# Patient Record
Sex: Male | Born: 1935
Health system: Southern US, Community
[De-identification: ages and names within clinical notes are randomized; demographics above are authoritative.]

## PROBLEM LIST (undated history)

## (undated) DIAGNOSIS — D494 Neoplasm of unspecified behavior of bladder: Secondary | ICD-10-CM

## (undated) DIAGNOSIS — R918 Other nonspecific abnormal finding of lung field: Secondary | ICD-10-CM

## (undated) DIAGNOSIS — Z85828 Personal history of other malignant neoplasm of skin: Secondary | ICD-10-CM

## (undated) DIAGNOSIS — F419 Anxiety disorder, unspecified: Secondary | ICD-10-CM

## (undated) DIAGNOSIS — N400 Enlarged prostate without lower urinary tract symptoms: Secondary | ICD-10-CM

## (undated) DIAGNOSIS — N281 Cyst of kidney, acquired: Secondary | ICD-10-CM

## (undated) DIAGNOSIS — Z8546 Personal history of malignant neoplasm of prostate: Secondary | ICD-10-CM

## (undated) DIAGNOSIS — Z973 Presence of spectacles and contact lenses: Secondary | ICD-10-CM

## (undated) DIAGNOSIS — Z8709 Personal history of other diseases of the respiratory system: Secondary | ICD-10-CM

## (undated) DIAGNOSIS — E785 Hyperlipidemia, unspecified: Secondary | ICD-10-CM

## (undated) DIAGNOSIS — J189 Pneumonia, unspecified organism: Secondary | ICD-10-CM

## (undated) DIAGNOSIS — R350 Frequency of micturition: Secondary | ICD-10-CM

## (undated) DIAGNOSIS — C679 Malignant neoplasm of bladder, unspecified: Secondary | ICD-10-CM

## (undated) DIAGNOSIS — Z974 Presence of external hearing-aid: Secondary | ICD-10-CM

## (undated) DIAGNOSIS — J45909 Unspecified asthma, uncomplicated: Secondary | ICD-10-CM

## (undated) DIAGNOSIS — Z923 Personal history of irradiation: Secondary | ICD-10-CM

## (undated) DIAGNOSIS — Z8639 Personal history of other endocrine, nutritional and metabolic disease: Secondary | ICD-10-CM

## (undated) HISTORY — PX: UPPER GI ENDOSCOPY: SHX6162

## (undated) HISTORY — PX: TRANSURETHRAL RESECTION OF BLADDER TUMOR: SHX2575

## (undated) HISTORY — PX: FIBEROPTIC BRONCHOSCOPY: SHX5367

## (undated) HISTORY — PX: COLONOSCOPY: SHX174

## (undated) HISTORY — PX: TONSILLECTOMY: SUR1361

## (undated) HISTORY — PX: OTHER SURGICAL HISTORY: SHX169

---

## 1998-12-31 ENCOUNTER — Encounter: Payer: Self-pay | Admitting: Internal Medicine

## 1998-12-31 ENCOUNTER — Ambulatory Visit (HOSPITAL_COMMUNITY): Admission: RE | Admit: 1998-12-31 | Discharge: 1998-12-31 | Payer: Self-pay | Admitting: Internal Medicine

## 1999-01-11 ENCOUNTER — Ambulatory Visit (HOSPITAL_COMMUNITY): Admission: RE | Admit: 1999-01-11 | Discharge: 1999-01-11 | Payer: Self-pay

## 1999-07-02 ENCOUNTER — Ambulatory Visit (HOSPITAL_BASED_OUTPATIENT_CLINIC_OR_DEPARTMENT_OTHER): Admission: RE | Admit: 1999-07-02 | Discharge: 1999-07-02 | Payer: Self-pay | Admitting: Otolaryngology

## 2001-01-05 ENCOUNTER — Other Ambulatory Visit: Admission: RE | Admit: 2001-01-05 | Discharge: 2001-01-05 | Payer: Self-pay | Admitting: Urology

## 2001-01-12 ENCOUNTER — Encounter: Admission: RE | Admit: 2001-01-12 | Discharge: 2001-04-12 | Payer: Self-pay | Admitting: Radiation Oncology

## 2001-04-05 ENCOUNTER — Ambulatory Visit (HOSPITAL_BASED_OUTPATIENT_CLINIC_OR_DEPARTMENT_OTHER): Admission: RE | Admit: 2001-04-05 | Discharge: 2001-04-05 | Payer: Self-pay | Admitting: Urology

## 2001-04-05 ENCOUNTER — Encounter: Payer: Self-pay | Admitting: Urology

## 2001-05-04 ENCOUNTER — Ambulatory Visit: Admission: RE | Admit: 2001-05-04 | Discharge: 2001-08-02 | Payer: Self-pay | Admitting: Radiation Oncology

## 2001-11-03 HISTORY — PX: PARATHYROIDECTOMY: SHX19

## 2004-01-01 ENCOUNTER — Ambulatory Visit (HOSPITAL_COMMUNITY): Admission: RE | Admit: 2004-01-01 | Discharge: 2004-01-01 | Payer: Self-pay | Admitting: Cardiology

## 2004-01-01 HISTORY — PX: CARDIOVASCULAR STRESS TEST: SHX262

## 2005-08-15 ENCOUNTER — Encounter: Admission: RE | Admit: 2005-08-15 | Discharge: 2005-08-15 | Payer: Self-pay | Admitting: Internal Medicine

## 2005-08-18 ENCOUNTER — Ambulatory Visit: Payer: Self-pay | Admitting: Internal Medicine

## 2005-08-20 ENCOUNTER — Ambulatory Visit: Payer: Self-pay | Admitting: Internal Medicine

## 2005-08-20 ENCOUNTER — Ambulatory Visit: Admission: RE | Admit: 2005-08-20 | Discharge: 2005-08-20 | Payer: Self-pay | Admitting: Internal Medicine

## 2005-08-20 ENCOUNTER — Encounter (INDEPENDENT_AMBULATORY_CARE_PROVIDER_SITE_OTHER): Payer: Self-pay | Admitting: *Deleted

## 2005-08-27 ENCOUNTER — Ambulatory Visit: Payer: Self-pay | Admitting: Internal Medicine

## 2005-09-10 ENCOUNTER — Ambulatory Visit: Payer: Self-pay | Admitting: Internal Medicine

## 2006-01-13 ENCOUNTER — Encounter: Admission: RE | Admit: 2006-01-13 | Discharge: 2006-01-13 | Payer: Self-pay | Admitting: Internal Medicine

## 2006-07-15 ENCOUNTER — Encounter: Admission: RE | Admit: 2006-07-15 | Discharge: 2006-07-15 | Payer: Self-pay | Admitting: Internal Medicine

## 2006-08-28 ENCOUNTER — Ambulatory Visit (HOSPITAL_COMMUNITY): Admission: RE | Admit: 2006-08-28 | Discharge: 2006-08-28 | Payer: Self-pay | Admitting: *Deleted

## 2007-05-31 ENCOUNTER — Encounter: Admission: RE | Admit: 2007-05-31 | Discharge: 2007-05-31 | Payer: Self-pay | Admitting: Internal Medicine

## 2008-11-03 HISTORY — PX: CATARACT EXTRACTION W/ INTRAOCULAR LENS  IMPLANT, BILATERAL: SHX1307

## 2009-07-19 ENCOUNTER — Emergency Department (HOSPITAL_COMMUNITY): Admission: EM | Admit: 2009-07-19 | Discharge: 2009-07-19 | Payer: Self-pay | Admitting: Emergency Medicine

## 2011-02-07 LAB — URINALYSIS, ROUTINE W REFLEX MICROSCOPIC
Glucose, UA: NEGATIVE mg/dL
Nitrite: POSITIVE — AB
Protein, ur: >300 mg/dL — AB
pH: 6 (ref 5.0–8.0)

## 2011-02-07 LAB — URINE CULTURE

## 2011-02-07 LAB — URINE MICROSCOPIC-ADD ON

## 2011-03-21 NOTE — Op Note (Signed)
[place]  Patient:    Jesse, [name]                  MRN: [id number] Proc. Date: 04/05/01 Attending:  Bertram Millard. Retta Diones, M.D. CC:         Janae Bridgeman. Eloise Harman., M.D.  Maryln Gottron, M.D.   Operative Report  PREOPERATIVE DIAGNOSIS:  Adenocarcinoma of the prostate.  POSTOPERATIVE DIAGNOSIS:  Adenocarcinoma of the prostate.  PROCEDURE:  Placement of I-125 seeds within the prostate, cystoscopy.  ANESTHESIA:  General endotracheal anesthesia.  SURGEON:  Bertram Millard. Dahlstedt, M.D.  COMPLICATIONS:  None.  INDICATIONS:  A 77 year old male with adenocarcinoma of the prostate.  This was diagnosed with the patient having a PSA of 4.9.  He had a Gleeson score of 7 out of 10.  Cancer cells were found on the right side only.  The patient has decided to undergo radiation therapy and has completed conformal 3-D radiation therapy by Dr. Dayton Scrape.  He presents at this time for I-125 seed implantation. He is aware of risks and complications of the procedure.  DESCRIPTION OF PROCEDURE:  The patient was administered 400 mg of IV Cipro and taken to the operating room where general endotracheal anesthesia was administered.  He was placed in the dorsal lithotomy position.  A Foley catheter was placed and the patients genitalia and perineum were prepped and draped.  An ultrasound transducer was placed in the rectum and hooked to the transducer holder.  The prostate was scanned.  It was approximately 45 mm in length.  Using a reference plane of 1.5 cm from the base, anchoring needles were placed.  At this point using ultrasonographic and fluoroscopic localization, a total of 77 seeds were placed using 22 needles.  Distribution appeared excellent fluoroscopically.  There was one single seed that was free in the bladder.  There was a mild amount of blood in the patients urine, and I could not grasp the seed, but could identify it cystoscopically. No  other seeds were seen.  It was felt that the patient would void this seed out after the catheter was removed.  A 16 French Foley catheter was replaced and hooked to dependent drainage.  At this point, the procedure was terminated.  The patient was awakened, extubated, and taken to the PACU in stable condition. DD:  04/05/01 TD:  04/05/01 Job: [id number]

## 2011-03-21 NOTE — Op Note (Signed)
NAME:  Jesse [name], Jesse [name]            ACCOUNT NO.:  [id number]   MEDICAL RECORD NO.:  [id number]          PATIENT TYPE:  AMB   LOCATION:  CARD                         FACILITY:  [facility]   PHYSICIAN:  Casimiro Needle B. Sherene Sires, M.D. [facility] OF BIRTH:  04/13/1932   DATE OF PROCEDURE:  08/20/2005  DATE OF DISCHARGE:                                 OPERATIVE REPORT   PROCEDURE:  Fiberoptic bronchoscopy with Wang biopsy of the left lower lobe  orifice.   REFERRED BY:  Janae Bridgeman. Lendell Caprice, M.D.   HISTORY:  Please see attached dictated pulmonary consultation note from this  week's office records. The patient agreed to the procedure after a full  discussion of the risks, benefits, and alternatives in the office.   He was continuously monitored by surface ECG oximetry and maintained  adequate saturations on nasal oxygen throughout the procedure. He was  premedicated with 1% lidocaine by updraft nebulizer and an additional 2%  lidocaine into the right naris.   Using a standard flexible fiberoptic bronchoscope, the right naris was  easily cannulated with good visualization of the entire oropharynx and  larynx. The cords appeared to move normally and there were no apparent upper  airway lesions.   Versed 5 mg IV was then given for adequate sedation. The bronchoscope was  then advanced through the cords with good visualization of all the airways  to the subsegmental level with the following findings:   There were copious mucopurulent secretions present throughout the major  airways, especially in the left lower lobe. Once these were suctioned free,  the underlying mucosa appeared diffusely friable, especially on the left,  but there were no focal endobronchial lesions. There was mild swelling in  the air divider between the left lower lobe basal segment and the superior  segment. This was the only endobronchial abnormality. All of the basal  segments of the left lower lobe and superior segment were  inspected  carefully with no evidence of an endobronchial process.   DESCRIPTION OF PROCEDURE:  Therefore, using a Wang approach, the area of  swelling between the superior segment of the left lower lobe and basal  segment of the left lower lobe was biopsied x1 with scant material. Since  this was a low yield and awkward procedure based on the angle, I decided to  terminate the procedure at that point and will follow the patient as an  outpatient.   IMPRESSION:  Probable pneumonia, possibly aspiration, with organization  mimicking tumor with no evidence of an endobronchial obstructing process.   RECOMMENDATIONS:  1.  Followup in the office with a chest x-ray in a week.  2.  Complete present course of antibiotics (Avelox) per Dr. Pincus Sanes      recommendations.           ______________________________  Charlaine Dalton Sherene Sires, M.D. [facility]     MBW/MEDQ  D:  08/20/2005  T:  08/20/2005  Job:  [id number]   cc:   Janae Bridgeman. Eloise Harman., M.D.  Fax: [phone]

## 2013-05-02 ENCOUNTER — Other Ambulatory Visit: Payer: Self-pay | Admitting: Otolaryngology

## 2014-10-21 ENCOUNTER — Emergency Department (HOSPITAL_COMMUNITY)
Admission: EM | Admit: 2014-10-21 | Discharge: 2014-10-21 | Disposition: A | Discharge status: Home / Self Care | Payer: Medicare Other | Attending: Emergency Medicine | Admitting: Emergency Medicine

## 2014-10-21 ENCOUNTER — Encounter (HOSPITAL_COMMUNITY): Payer: Self-pay | Admitting: *Deleted

## 2014-10-21 DIAGNOSIS — R319 Hematuria, unspecified: Secondary | ICD-10-CM | POA: Diagnosis not present

## 2014-10-21 DIAGNOSIS — Z8659 Personal history of other mental and behavioral disorders: Secondary | ICD-10-CM | POA: Insufficient documentation

## 2014-10-21 DIAGNOSIS — Z87891 Personal history of nicotine dependence: Secondary | ICD-10-CM | POA: Insufficient documentation

## 2014-10-21 DIAGNOSIS — Z8639 Personal history of other endocrine, nutritional and metabolic disease: Secondary | ICD-10-CM | POA: Diagnosis not present

## 2014-10-21 DIAGNOSIS — R339 Retention of urine, unspecified: Secondary | ICD-10-CM

## 2014-10-21 HISTORY — DX: Anxiety disorder, unspecified: F41.9

## 2014-10-21 LAB — URINALYSIS, ROUTINE W REFLEX MICROSCOPIC
BILIRUBIN URINE: NEGATIVE
Glucose, UA: NEGATIVE mg/dL
Ketones, ur: NEGATIVE mg/dL
Leukocytes, UA: NEGATIVE
Nitrite: NEGATIVE
PROTEIN: 30 mg/dL — AB
Specific Gravity, Urine: 1.009 (ref 1.005–1.030)
UROBILINOGEN UA: 0.2 mg/dL (ref 0.0–1.0)
pH: 5.5 (ref 5.0–8.0)

## 2014-10-21 LAB — URINE MICROSCOPIC-ADD ON

## 2014-10-21 NOTE — ED Notes (Signed)
Pt c/o not being able to urinate since 1700 on 10/20/14. Pt states he has also been having some hematuria. Pt states he saw his md on Monday for physical and no problems noted at that time

## 2014-10-21 NOTE — Discharge Instructions (Signed)
Hematuria Hematuria is blood in your urine. It can be caused by a bladder infection, kidney infection, prostate infection, kidney stone, or cancer of your urinary tract. Infections can usually be treated with medicine, and a kidney stone usually will pass through your urine. If neither of these is the cause of your hematuria, further workup to find out the reason may be needed. It is very important that you tell your health care provider about any blood you see in your urine, even if the blood stops without treatment or happens without causing pain. Blood in your urine that happens and then stops and then happens again can be a symptom of a very serious condition. Also, pain is not a symptom in the initial stages of many urinary cancers. HOME CARE INSTRUCTIONS   Drink lots of fluid, 3-4 quarts a day. If you have been diagnosed with an infection, cranberry juice is especially recommended, in addition to large amounts of water.  Avoid caffeine, tea, and carbonated beverages because they tend to irritate the bladder.  Avoid alcohol because it may irritate the prostate.  Take all medicines as directed by your health care provider.  If you were prescribed an antibiotic medicine, finish it all even if you start to feel better.  If you have been diagnosed with a kidney stone, follow your health care provider's instructions regarding straining your urine to catch the stone.  Empty your bladder often. Avoid holding urine for long periods of time.  After a bowel movement, women should cleanse front to back. Use each tissue only once.  Empty your bladder before and after sexual intercourse if you are a male. SEEK MEDICAL CARE IF: 1. You develop back pain. 2. You have a fever. 3. You have a feeling of sickness in your stomach (nausea) or vomiting. 4. Your symptoms are not better in 3 days. Return sooner if you are getting worse. SEEK IMMEDIATE MEDICAL CARE IF:  1. You develop severe vomiting and  are unable to keep the medicine down. 2. You develop severe back or abdominal pain despite taking your medicines. 3. You begin passing a large amount of blood or clots in your urine. 4. You feel extremely weak or faint, or you pass out. MAKE SURE YOU:  1. Understand these instructions. 2. Will watch your condition. 3. Will get help right away if you are not doing well or get worse. Document Released: 10/20/2005 Document Revised: 03/06/2014 Document Reviewed: 06/20/2013 [facility] Patient Information 2015 [place], [place]. This information is not intended to replace advice given to you by your health care provider. Make sure you discuss any questions you have with your health care provider. Foley Catheter Care A Foley catheter is a soft, flexible tube that is placed into the bladder to drain urine. A Foley catheter may be inserted if:  You leak urine or are not able to control when you urinate (urinary incontinence).  You are not able to urinate when you need to (urinary retention).  You had prostate surgery or surgery on the genitals.  You have certain medical conditions, such as multiple sclerosis, dementia, or a spinal cord injury. If you are going home with a Foley catheter in place, follow the instructions below. TAKING CARE OF THE CATHETER 5. Wash your hands with soap and water. 6. Using mild soap and warm water on a clean washcloth:  Clean the area on your body closest to the catheter insertion site using a circular motion, moving away from the catheter. Never wipe toward  the catheter because this could sweep bacteria up into the urethra and cause infection.  Remove all traces of soap. Pat the area dry with a clean towel. For males, reposition the foreskin. 7. Attach the catheter to your leg so there is no tension on the catheter. Use adhesive tape or a leg strap. If you are using adhesive tape, remove any sticky residue left behind by the previous tape you used. 8. Keep the drainage  bag below the level of the bladder, but keep it off the floor. 9. Check throughout the day to be sure the catheter is working and urine is draining freely. Make sure the tubing does not become kinked. 10. Do not pull on the catheter or try to remove it. Pulling could damage internal tissues. TAKING CARE OF THE DRAINAGE BAGS You will be given two drainage bags to take home. One is a large overnight drainage bag, and the other is a smaller leg bag that fits underneath clothing. You may wear the overnight bag at any time, but you should never wear the smaller leg bag at night. Follow the instructions below for how to empty, change, and clean your drainage bags. Emptying the Drainage Bag You must empty your drainage bag when it is  - full or at least 2-3 times a day. 5. Wash your hands with soap and water. 6. Keep the drainage bag below your hips, below the level of your bladder. This stops urine from going back into the tubing and into your bladder. 7. Hold the dirty bag over the toilet or a clean container. 8. Open the pour spout at the bottom of the bag and empty the urine into the toilet or container. Do not let the pour spout touch the toilet, container, or any other surface. Doing so can place bacteria on the bag, which can cause an infection. 9. Clean the pour spout with a gauze pad or cotton ball that has rubbing alcohol on it. 10. Close the pour spout. 11. Attach the bag to your leg with adhesive tape or a leg strap. 12. Wash your hands well. Changing the Drainage Bag Change your drainage bag once a month or sooner if it starts to smell bad or look dirty. Below are steps to follow when changing the drainage bag. 4. Wash your hands with soap and water. 5. Pinch off the rubber catheter so that urine does not spill out. 6. Disconnect the catheter tube from the drainage tube at the connection valve. Do not let the tubes touch any surface. 7. Clean the end of the catheter tube with an alcohol  wipe. Use a different alcohol wipe to clean the end of the drainage tube. 8. Connect the catheter tube to the drainage tube of the clean drainage bag. 9. Attach the new bag to the leg with adhesive tape or a leg strap. Avoid attaching the new bag too tightly. 10. Wash your hands well. Cleaning the Drainage Bag 1. Wash your hands with soap and water. 2. Wash the bag in warm, soapy water. 3. Rinse the bag thoroughly with warm water. 4. Fill the bag with a solution of white vinegar and water (1 cup vinegar to 1 qt warm water [.2 L vinegar to 1 L warm water]). Close the bag and soak it for 30 minutes in the solution. 5. Rinse the bag with warm water. 6. Hang the bag to dry with the pour spout open and hanging downward. 7. Store the clean bag (once it is dry)  in a clean plastic bag. 8. Wash your hands well. PREVENTING INFECTION  Wash your hands before and after handling your catheter.  Take showers daily and wash the area where the catheter enters your body. Do not take baths. Replace wet leg straps with dry ones, if this applies.  Do not use powders, sprays, or lotions on the genital area. Only use creams, lotions, or ointments as directed by your caregiver.  For females, wipe from front to back after each bowel movement.  Drink enough fluids to keep your urine clear or pale yellow unless you have a fluid restriction.  Do not let the drainage bag or tubing touch or lie on the floor.  Wear cotton underwear to absorb moisture and to keep your skin drier. SEEK MEDICAL CARE IF:   Your urine is cloudy or smells unusually bad.  Your catheter becomes clogged.  You are not draining urine into the bag or your bladder feels full.  Your catheter starts to leak. SEEK IMMEDIATE MEDICAL CARE IF:   You have pain, swelling, redness, or pus where the catheter enters the body.  You have pain in the abdomen, legs, lower back, or bladder.  You have a fever.  You see blood fill the catheter, or  your urine is pink or red.  You have nausea, vomiting, or chills.  Your catheter gets pulled out. MAKE SURE YOU:   Understand these instructions.  Will watch your condition.  Will get help right away if you are not doing well or get worse. Document Released: 10/20/2005 Document Revised: 03/06/2014 Document Reviewed: 10/11/2012 [facility] Patient Information 2015 [place], [place]. This information is not intended to replace advice given to you by your health care provider. Make sure you discuss any questions you have with your health care provider. Acute Urinary Retention Acute urinary retention is the temporary inability to urinate. This is a common problem in older men. As men age their prostates become larger and block the flow of urine from the bladder. This is usually a problem that has come on gradually.  HOME CARE INSTRUCTIONS If you are sent home with a Foley catheter and a drainage system, you will need to discuss the best course of action with your health care provider. While the catheter is in, maintain a good intake of fluids. Keep the drainage bag emptied and lower than your catheter. This is so that contaminated urine will not flow back into your bladder, which could lead to a urinary tract infection. There are two main types of drainage bags. One is a large bag that usually is used at night. It has a good capacity that will allow you to sleep through the night without having to empty it. The second type is called a leg bag. It has a smaller capacity, so it needs to be emptied more frequently. However, the main advantage is that it can be attached by a leg strap and can go underneath your clothing, allowing you the freedom to move about or leave your home. Only take over-the-counter or prescription medicines for pain, discomfort, or fever as directed by your health care provider.  SEEK MEDICAL CARE IF:  You develop a low-grade fever.  You experience spasms or leakage of urine with  the spasms. SEEK IMMEDIATE MEDICAL CARE IF:  11. You develop chills or fever. 12. Your catheter stops draining urine. 13. Your catheter falls out. 14. You start to develop increased bleeding that does not respond to rest and increased fluid intake. MAKE SURE  YOU: 13. Understand these instructions. 14. Will watch your condition. 15. Will get help right away if you are not doing well or get worse. Document Released: 01/26/2001 Document Revised: 10/25/2013 Document Reviewed: 03/31/2013 [facility] Patient Information 2015 [place], [place]. This information is not intended to replace advice given to you by your health care provider. Make sure you discuss any questions you have with your health care provider.

## 2014-10-21 NOTE — ED Provider Notes (Signed)
CSN: [id number]     Arrival date & time 10/21/14  0115 History   None    Chief Complaint  Patient presents with  . Urinary Retention  . Hematuria     (Consider location/radiation/quality/duration/timing/severity/associated sxs/prior Treatment) Patient is a 80 y.o. male presenting with hematuria. The history is provided by the patient. No language interpreter was used.  Hematuria Associated symptoms include abdominal pain. Pertinent negatives include no chills, fever or vomiting. Associated symptoms comments: For the past 3 months he has seen blood at the end of a urinary stream. No difficulty urinating or dysuria. Last evening he woke with the need to go to the bathroom but could not produce a urinary stream. He reports history of retention, the last episode was 2 years ago. No fever, testicular pain or scrotal swelling..    Past Medical History  Diagnosis Date  . Hyperlipemia   . Anxiety    Past Surgical History  Procedure Laterality Date  . Parathyroidectomy    . Prostatectomy    . Cataract extraction    . Nose surgery     History reviewed. No pertinent family history. History  Substance Use Topics  . Smoking status: Former Smoker    Quit date: 11/03/1984  . Smokeless tobacco: Never Used  . Alcohol Use: Yes     Comment: with dinner    Review of Systems  Constitutional: Negative for fever and chills.  Gastrointestinal: Positive for abdominal pain. Negative for vomiting.  Genitourinary: Positive for hematuria and difficulty urinating. Negative for dysuria, scrotal swelling and testicular pain.  Musculoskeletal: Negative.   Skin: Negative.   Neurological: Negative.       Allergies  Review of patient's allergies indicates no known allergies.  Home Medications   Prior to Admission medications   Not on File   BP 124/58 mmHg  Pulse 88  Temp(Src) 97.5 F (36.4 C) (Oral)  Resp 18  SpO2 100% Physical Exam  Constitutional: He is oriented to person, place, and  time. He appears well-developed and well-nourished.  HENT:  Head: Normocephalic.  Neck: Normal range of motion. Neck supple.  Cardiovascular: Normal rate and regular rhythm.   Pulmonary/Chest: Effort normal and breath sounds normal.  Abdominal: Soft. Bowel sounds are normal. There is no tenderness. There is no rebound and no guarding.  Examined after placement of foley catheter.   Musculoskeletal: Normal range of motion.  Neurological: He is alert and oriented to person, place, and time.  Skin: Skin is warm and dry. No rash noted.  Psychiatric: He has a normal mood and affect.    ED Course  Procedures (including critical care time) Labs Review Labs Reviewed  URINALYSIS, ROUTINE W REFLEX MICROSCOPIC - Abnormal; Notable for the following:    APPearance CLOUDY (*)    Hgb urine dipstick LARGE (*)    Protein, ur 30 (*)    All other components within normal limits  URINE MICROSCOPIC-ADD ON - Abnormal; Notable for the following:    Bacteria, UA FEW (*)    All other components within normal limits    Imaging Review No results found.   EKG Interpretation None      MDM   Final diagnoses:  None    1. Urinary retention 2. Hematuria  Foley catheter placed. Approximately 800 cc's bloody urine without clots returned. He feels much better. No infection in urine. He can be discharged home with leg bag, foley in place, and follow up with his urologist (Dr. Diona Fanti) on Monday.  Dewaine Oats, PA-C 10/21/14 [place], MD 10/22/14 [phone]

## 2014-10-21 NOTE — ED Notes (Signed)
Discharge instructions reviewed with patient--agrees and verbalized understanding Patient informed of need to make and keep follow up appointment with Urology--agrees and verbalized understanding VS updated and stable--reviewed with patient at time of DC--agrees and verbalized understanding Patient alert and oriented x 4 and in NAD at time of discharge All questions related to this ED visit, DC instructions and follow up care answered to patient's satisfaction by this nurse Leg bag placed prior to DC from ED Patient and pt's wife educated about care of catheter and how to empty urine from bag--both v/u

## 2017-02-13 ENCOUNTER — Encounter (HOSPITAL_BASED_OUTPATIENT_CLINIC_OR_DEPARTMENT_OTHER): Payer: Self-pay | Admitting: Emergency Medicine

## 2017-02-13 ENCOUNTER — Emergency Department (HOSPITAL_BASED_OUTPATIENT_CLINIC_OR_DEPARTMENT_OTHER)
Admission: EM | Admit: 2017-02-13 | Discharge: 2017-02-13 | Disposition: A | Discharge status: Home / Self Care | Payer: Medicare Other | Attending: Emergency Medicine | Admitting: Emergency Medicine

## 2017-02-13 DIAGNOSIS — Z87891 Personal history of nicotine dependence: Secondary | ICD-10-CM | POA: Insufficient documentation

## 2017-02-13 DIAGNOSIS — R319 Hematuria, unspecified: Secondary | ICD-10-CM | POA: Insufficient documentation

## 2017-02-13 DIAGNOSIS — R1030 Lower abdominal pain, unspecified: Secondary | ICD-10-CM | POA: Diagnosis not present

## 2017-02-13 DIAGNOSIS — Z79899 Other long term (current) drug therapy: Secondary | ICD-10-CM | POA: Insufficient documentation

## 2017-02-13 DIAGNOSIS — R339 Retention of urine, unspecified: Secondary | ICD-10-CM | POA: Diagnosis not present

## 2017-02-13 HISTORY — DX: Malignant neoplasm of bladder, unspecified: C67.9

## 2017-02-13 LAB — URINALYSIS, ROUTINE W REFLEX MICROSCOPIC

## 2017-02-13 LAB — URINALYSIS, MICROSCOPIC (REFLEX)

## 2017-02-13 NOTE — ED Triage Notes (Signed)
Pt seen on 02/10/17 for Bladder biopsy.  Pt started to have some hematuria and difficulty urination today.  Pt has supplies for straight catheterization but is not aware how to use them.  The nurse at the independent facility where pt lives instructed the patient come to ED.

## 2017-02-13 NOTE — ED Notes (Signed)
Pt discharged to home with family. NAD.  

## 2017-02-13 NOTE — ED Provider Notes (Signed)
[facility] Provider Note   CSN: [id number] Arrival date & time: 02/13/17  1704     History   Chief Complaint Chief Complaint  Patient presents with  . Urinary Retention    HPI Jesse [name] is a 83 y.o. male.  Patient is a 83 year old male who presents with urinary retention. [place] He [place] seen a urologist in [place]. He saw his urologist in [place] this past Tuesday and had a bladder biopsy which she reports was negative for cancer. However he's had difficulty urinating since that time. On Wednesday while he was still in [place] and he went back to the urologist office in the place a Foley catheter with a leg bag. He states that Wednesday night he wasn't getting the drainage from the catheter and he cut the balloon and pulled the catheter out. His urologist had originally told him to leave it in until Friday which is today but since it wasn't working and he went ahead and pulled it out on Wednesday. Yesterday he doesn't have any trouble with urination although he has had some intermittent hematuria. Today's had more difficulty urinating with his last urination was normal being early this morning at about 5:30. He still having some bleeding from the tip of his penis and he has pain at the tip of his penis. He has some pressure feeling to his bladder. No fevers. No nausea or vomiting. He is currently on Cipro and has 1 more days worth of antibiotics to take.      Past Medical History:  Diagnosis Date  . Anxiety   . Bladder cancer ([place])   . Enlarged prostate   . Hyperlipemia     There are no active problems to display for this patient.   Past Surgical History:  Procedure Laterality Date  . CATARACT EXTRACTION    . CYSTOSTOMY W/ BLADDER BIOPSY    . NOSE SURGERY    . PARATHYROIDECTOMY    . PROSTATECTOMY         Home Medications    Prior to Admission medications   Medication Sig Start Date End Date  Taking? Authorizing Provider  chlordiazePOXIDE (LIBRIUM) 10 MG capsule Take 10 mg by mouth daily.   Yes Historical Provider, MD  Cholecalciferol (VITAMIN D3) 1000 units CAPS Take 1,000 Units by mouth daily.   Yes Historical Provider, MD  ciprofloxacin (CIPRO) 250 MG tablet Take 250 mg by mouth 2 (two) times daily.   Yes Historical Provider, MD  nystatin-triamcinolone (MYCOLOG II) cream Apply 1 application topically 4 (four) times daily.   Yes Historical Provider, MD  oxyCODONE-acetaminophen (PERCOCET/ROXICET) 5-325 MG tablet Take 1-2 tablets by mouth every 4 (four) hours as needed for severe pain.   Yes Historical Provider, MD  pravastatin (PRAVACHOL) 40 MG tablet Take 40 mg by mouth daily.  09/30/14  Yes Historical Provider, MD  solifenacin (VESICARE) 10 MG tablet Take 10 mg by mouth daily.   Yes Historical Provider, MD  tamsulosin (FLOMAX) 0.4 MG CAPS capsule Take 0.8 mg by mouth daily.   Yes Historical Provider, MD  thiamine 100 MG tablet Take 100 mg by mouth daily.   Yes Historical Provider, MD    Family History No family history on file.  Social History Social History  Substance Use Topics  . Smoking status: Former Smoker    Quit date: 11/03/1984  . Smokeless tobacco: Never Used  . Alcohol use Yes     Comment: with dinner  Allergies   Patient has no known allergies.   Review of Systems Review of Systems  Constitutional: Negative for chills, diaphoresis, fatigue and fever.  HENT: Negative for congestion, rhinorrhea and sneezing.   Eyes: Negative.   Respiratory: Negative for cough, chest tightness and shortness of breath.   Cardiovascular: Negative for chest pain and leg swelling.  Gastrointestinal: Positive for abdominal pain. Negative for blood in stool, diarrhea, nausea and vomiting.  Genitourinary: Positive for difficulty urinating, hematuria and penile pain. Negative for flank pain and frequency.  Musculoskeletal: Negative for arthralgias and back pain.  Skin:  Negative for rash.  Neurological: Negative for dizziness, speech difficulty, weakness, numbness and headaches.     Physical Exam Updated Vital Signs BP 128/83 (BP Location: Right Arm)   Pulse 77   Temp 98.6 F (37 C) (Oral)   Resp 18   Ht 5\' 9"  (1.753 m)   Wt 138 lb (62.6 kg)   SpO2 96%   BMI 20.38 kg/m   Physical Exam  Constitutional: He is oriented to person, place, and time. He appears well-developed and well-nourished.  HENT:  Head: Normocephalic and atraumatic.  Eyes: Pupils are equal, round, and reactive to light.  Neck: Normal range of motion. Neck supple.  Cardiovascular: Normal rate, regular rhythm and normal heart sounds.   Pulmonary/Chest: Effort normal and breath sounds normal. No respiratory distress. He has no wheezes. He has no rales. He exhibits no tenderness.  Abdominal: Soft. Bowel sounds are normal. There is tenderness (Moderate suprapubic tenderness). There is no rebound and no guarding.  Genitourinary:  Genitourinary Comments: Small amount of blood at the tip of the penis  Musculoskeletal: Normal range of motion. He exhibits no edema.  Lymphadenopathy:    He has no cervical adenopathy.  Neurological: He is alert and oriented to person, place, and time.  Skin: Skin is warm and dry. No rash noted.  Psychiatric: He has a normal mood and affect.     ED Treatments / Results  Labs (all labs ordered are listed, but only abnormal results are displayed) Labs Reviewed  URINALYSIS, ROUTINE W REFLEX MICROSCOPIC - Abnormal; Notable for the following:       Result Value   Color, Urine BROWN (*)    APPearance TURBID (*)    Glucose, UA   (*)    Value: TEST NOT REPORTED DUE TO COLOR INTERFERENCE OF URINE PIGMENT   Hgb urine dipstick   (*)    Value: TEST NOT REPORTED DUE TO COLOR INTERFERENCE OF URINE PIGMENT   Bilirubin Urine   (*)    Value: TEST NOT REPORTED DUE TO COLOR INTERFERENCE OF URINE PIGMENT   Ketones, ur   (*)    Value: TEST NOT REPORTED DUE TO  COLOR INTERFERENCE OF URINE PIGMENT   Protein, ur   (*)    Value: TEST NOT REPORTED DUE TO COLOR INTERFERENCE OF URINE PIGMENT   Nitrite   (*)    Value: TEST NOT REPORTED DUE TO COLOR INTERFERENCE OF URINE PIGMENT   Leukocytes, UA   (*)    Value: TEST NOT REPORTED DUE TO COLOR INTERFERENCE OF URINE PIGMENT   All other components within normal limits  URINALYSIS, MICROSCOPIC (REFLEX) - Abnormal; Notable for the following:    Bacteria, UA FEW (*)    Squamous Epithelial / LPF 0-5 (*)    All other components within normal limits    EKG  EKG Interpretation None       Radiology No results found.  Procedures  Procedures (including critical care time)  Medications Ordered in ED Medications - No data to display   Initial Impression / Assessment and Plan / ED Course  I have reviewed the triage vital signs and the nursing notes.  Pertinent labs & imaging results that were available during my care of the patient were reviewed by me and considered in my medical decision making (see chart for details).     Patient had a Foley catheter placed and there was over 700 cc of bloody urine return. There some intermittent clotting but it is draining well. The urinalysis did not report out due to the blood. He is currently on antibiotics I did not feel the need to change this. He will call his urologist in [place] on Monday to ascertain when he needs to take the catheter out. He is well quit to remove the catheter on his own. He was advised return here if he has any worsening symptoms. He's also been a call [facility] to establish care there as he's been seen there in the past.  Final Clinical Impressions(s) / ED Diagnoses   Final diagnoses:  Urinary retention    New Prescriptions New Prescriptions   No medications on file     Malvin Johns, MD 02/13/17 1937

## 2017-02-13 NOTE — ED Triage Notes (Signed)
Pt presents to ED via EMS with complaints of urinary retention.  PT states he has not urinated since 530am. Pt had bladder biopsy 2 days ago.

## 2017-02-14 ENCOUNTER — Encounter (HOSPITAL_BASED_OUTPATIENT_CLINIC_OR_DEPARTMENT_OTHER): Payer: Self-pay | Admitting: *Deleted

## 2017-02-14 ENCOUNTER — Emergency Department (HOSPITAL_BASED_OUTPATIENT_CLINIC_OR_DEPARTMENT_OTHER)
Admission: EM | Admit: 2017-02-14 | Discharge: 2017-02-14 | Disposition: A | Discharge status: Home / Self Care | Payer: Medicare Other | Attending: Emergency Medicine | Admitting: Emergency Medicine

## 2017-02-14 DIAGNOSIS — Z87891 Personal history of nicotine dependence: Secondary | ICD-10-CM | POA: Diagnosis not present

## 2017-02-14 DIAGNOSIS — Z79899 Other long term (current) drug therapy: Secondary | ICD-10-CM | POA: Diagnosis not present

## 2017-02-14 DIAGNOSIS — R339 Retention of urine, unspecified: Secondary | ICD-10-CM | POA: Insufficient documentation

## 2017-02-14 DIAGNOSIS — R319 Hematuria, unspecified: Secondary | ICD-10-CM | POA: Insufficient documentation

## 2017-02-14 DIAGNOSIS — Z8551 Personal history of malignant neoplasm of bladder: Secondary | ICD-10-CM | POA: Diagnosis not present

## 2017-02-14 NOTE — ED Provider Notes (Addendum)
[facility] Provider Note   CSN: [id number] Arrival date & time: 02/14/17  1032     History   Chief Complaint Chief Complaint  Patient presents with  . Urinary Retention    HPI Jesse [name] is a 83 y.o. male.  Patient status post bladder biopsy 6 on Monday in the [place] area. Patient has now moved back to this area. Patient followed by [facility] in the past. She had a history of bladder cancer in the past. Screening and follow-up raise concerns reason for the biopsies. Patient is not on blood thinners. Patient seen on the 13th for urinary retention. Patient's had supplies for straight cathing but did not know how to use them when he was seen on the 13th. On the 13th catheter was placed and leg bag put in place patient's been draining well since then. This morning he noticed decreased urine output. No real feeling of distention or abdominal pain. No nausea no vomiting. Chart reviewed from ED visit on the 13th.      Past Medical History:  Diagnosis Date  . Anxiety   . Bladder cancer ([place])   . Enlarged prostate   . Hyperlipemia     There are no active problems to display for this patient.   Past Surgical History:  Procedure Laterality Date  . CATARACT EXTRACTION    . CYSTOSTOMY W/ BLADDER BIOPSY    . NOSE SURGERY    . PARATHYROIDECTOMY    . PROSTATECTOMY         Home Medications    Prior to Admission medications   Medication Sig Start Date End Date Taking? Authorizing Provider  chlordiazePOXIDE (LIBRIUM) 10 MG capsule Take 10 mg by mouth daily.    Historical Provider, MD  Cholecalciferol (VITAMIN D3) 1000 units CAPS Take 1,000 Units by mouth daily.    Historical Provider, MD  ciprofloxacin (CIPRO) 250 MG tablet Take 250 mg by mouth 2 (two) times daily.    Historical Provider, MD  nystatin-triamcinolone (MYCOLOG II) cream Apply 1 application topically 4 (four) times daily.    Historical Provider, MD  oxyCODONE-acetaminophen  (PERCOCET/ROXICET) 5-325 MG tablet Take 1-2 tablets by mouth every 4 (four) hours as needed for severe pain.    Historical Provider, MD  pravastatin (PRAVACHOL) 40 MG tablet Take 40 mg by mouth daily.  09/30/14   Historical Provider, MD  solifenacin (VESICARE) 10 MG tablet Take 10 mg by mouth daily.    Historical Provider, MD  tamsulosin (FLOMAX) 0.4 MG CAPS capsule Take 0.8 mg by mouth daily.    Historical Provider, MD  thiamine 100 MG tablet Take 100 mg by mouth daily.    Historical Provider, MD    Family History History reviewed. No pertinent family history.  Social History Social History  Substance Use Topics  . Smoking status: Former Smoker    Quit date: 11/03/1984  . Smokeless tobacco: Never Used  . Alcohol use Yes     Comment: with dinner     Allergies   Patient has no known allergies.   Review of Systems Review of Systems  Constitutional: Negative for fever.  Eyes: Negative for redness.  Respiratory: Negative for shortness of breath.   Cardiovascular: Negative for chest pain.  Gastrointestinal: Negative for abdominal pain.  Genitourinary: Positive for difficulty urinating and hematuria.  Musculoskeletal: Negative for back pain.  Skin: Negative for rash.  Hematological: Does not bruise/bleed easily.  Psychiatric/Behavioral: Negative for confusion.     Physical Exam Updated Vital Signs  BP (!) 150/81 (BP Location: Right Arm)   Pulse (!) 104   Temp 98.2 F (36.8 C) (Oral)   Resp 16   Ht 5\' 9"  (1.753 m)   Wt 62.6 kg   SpO2 96%   BMI 20.38 kg/m   Physical Exam  Constitutional: He appears well-developed and well-nourished. No distress.  HENT:  Head: Normocephalic and atraumatic.  Mouth/Throat: Oropharynx is clear and moist.  Cardiovascular: Normal rate and regular rhythm.   Pulmonary/Chest: Effort normal and breath sounds normal.  Abdominal: Soft. Bowel sounds are normal. He exhibits no distension. There is no tenderness.  Genitourinary:  Genitourinary  Comments: Foley catheter in place. Leg bag attached. Minimal urine in the leg bag. Low bit of the blood noted.  Musculoskeletal: Normal range of motion.  Nursing note and vitals reviewed.    ED Treatments / Results  Labs (all labs ordered are listed, but only abnormal results are displayed) Labs Reviewed - No data to display  EKG  EKG Interpretation None       Radiology No results found.  Procedures Procedures (including critical care time)  Medications Ordered in ED Medications - No data to display   Initial Impression / Assessment and Plan / ED Course  I have reviewed the triage vital signs and the nursing notes.  Pertinent labs & imaging results that were available during my care of the patient were reviewed by me and considered in my medical decision making (see chart for details).     Patient just seen on the 13th for urinary retention. Patient has Foley catheter in place a leg bag. At that time they replaced everything. Patient awoke this morning with decreased urinary drainage. Bladder scan only shows 150 mL of urine so not overly distended today. We did irrigated and a clot did flush through. Patient had 6 bladder biopsies on Monday. Now is moved to this area. Followed by [facility] in the past. He will contact them for follow-up. Clots off again he will return.  No reason to do any labs are to check the urine.  Final Clinical Impressions(s) / ED Diagnoses   Final diagnoses:  Urinary retention    New Prescriptions New Prescriptions   No medications on file     Fredia Sorrow, MD 02/14/17 [place], MD 02/14/17 1245

## 2017-02-14 NOTE — ED Triage Notes (Signed)
Pt with urinary retention, states that he has not had any drainage into his leg bag since 7am this morning.  Hx of same.

## 2017-02-14 NOTE — Discharge Instructions (Signed)
Drink plenty of fluids. Call urology for follow-up. Return if the Foley catheter and leg bag clogs up again.

## 2017-02-14 NOTE — ED Notes (Signed)
Foley cath intact with blood tinged urine in tubing.

## 2017-06-17 ENCOUNTER — Other Ambulatory Visit: Payer: Self-pay | Admitting: Urology

## 2017-07-21 ENCOUNTER — Encounter (HOSPITAL_BASED_OUTPATIENT_CLINIC_OR_DEPARTMENT_OTHER): Payer: Self-pay | Admitting: *Deleted

## 2017-07-21 NOTE — Progress Notes (Signed)
NPO AFTER MN W/ EXCEPTION CLEAR LIQUIDS UNTIL 0630 (NO CREAM [employer]).  ARRIVE AT 1100.  NEEDS HG.  WILL TAKE FLOMAX ,  PRAVASTATIN, VESICARE, AND LIBRIUM AM DOS W/ SIPS OF WATER.

## 2017-07-27 ENCOUNTER — Ambulatory Visit (HOSPITAL_BASED_OUTPATIENT_CLINIC_OR_DEPARTMENT_OTHER): Payer: Medicare Other | Admitting: Certified Registered"

## 2017-07-27 ENCOUNTER — Encounter (HOSPITAL_BASED_OUTPATIENT_CLINIC_OR_DEPARTMENT_OTHER)
Admission: RE | Disposition: A | Discharge status: Home / Self Care | Payer: Self-pay | Source: Ambulatory Visit | Attending: Urology

## 2017-07-27 ENCOUNTER — Encounter (HOSPITAL_BASED_OUTPATIENT_CLINIC_OR_DEPARTMENT_OTHER): Payer: Self-pay | Admitting: *Deleted

## 2017-07-27 ENCOUNTER — Ambulatory Visit (HOSPITAL_BASED_OUTPATIENT_CLINIC_OR_DEPARTMENT_OTHER)
Admission: RE | Admit: 2017-07-27 | Discharge: 2017-07-27 | Disposition: A | Discharge status: Home / Self Care | Payer: Medicare Other | Source: Ambulatory Visit | Attending: Urology | Admitting: Urology

## 2017-07-27 DIAGNOSIS — Z79899 Other long term (current) drug therapy: Secondary | ICD-10-CM | POA: Diagnosis not present

## 2017-07-27 DIAGNOSIS — R828 Abnormal findings on cytological and histological examination of urine: Secondary | ICD-10-CM | POA: Insufficient documentation

## 2017-07-27 DIAGNOSIS — C679 Malignant neoplasm of bladder, unspecified: Secondary | ICD-10-CM | POA: Insufficient documentation

## 2017-07-27 DIAGNOSIS — Z87891 Personal history of nicotine dependence: Secondary | ICD-10-CM | POA: Insufficient documentation

## 2017-07-27 DIAGNOSIS — E785 Hyperlipidemia, unspecified: Secondary | ICD-10-CM | POA: Diagnosis not present

## 2017-07-27 DIAGNOSIS — M1990 Unspecified osteoarthritis, unspecified site: Secondary | ICD-10-CM | POA: Insufficient documentation

## 2017-07-27 DIAGNOSIS — E890 Postprocedural hypothyroidism: Secondary | ICD-10-CM | POA: Diagnosis not present

## 2017-07-27 DIAGNOSIS — F419 Anxiety disorder, unspecified: Secondary | ICD-10-CM | POA: Insufficient documentation

## 2017-07-27 DIAGNOSIS — Z8546 Personal history of malignant neoplasm of prostate: Secondary | ICD-10-CM | POA: Insufficient documentation

## 2017-07-27 HISTORY — DX: Presence of spectacles and contact lenses: Z97.3

## 2017-07-27 HISTORY — DX: Other nonspecific abnormal finding of lung field: R91.8

## 2017-07-27 HISTORY — DX: Personal history of malignant neoplasm of prostate: Z85.46

## 2017-07-27 HISTORY — DX: Hyperlipidemia, unspecified: E78.5

## 2017-07-27 HISTORY — DX: Frequency of micturition: R35.0

## 2017-07-27 HISTORY — PX: CYSTOSCOPY WITH BIOPSY: SHX5122

## 2017-07-27 HISTORY — DX: Benign prostatic hyperplasia without lower urinary tract symptoms: N40.0

## 2017-07-27 HISTORY — DX: Presence of external hearing-aid: Z97.4

## 2017-07-27 HISTORY — PX: CYSTOSCOPY W/ RETROGRADES: SHX1426

## 2017-07-27 HISTORY — DX: Personal history of irradiation: Z92.3

## 2017-07-27 HISTORY — DX: Personal history of other endocrine, nutritional and metabolic disease: Z86.39

## 2017-07-27 HISTORY — DX: Cyst of kidney, acquired: N28.1

## 2017-07-27 LAB — POCT HEMOGLOBIN-HEMACUE: Hemoglobin: 14.1 g/dL (ref 13.0–17.0)

## 2017-07-27 SURGERY — CYSTOSCOPY, WITH RETROGRADE PYELOGRAM
Anesthesia: General | Site: Ureter

## 2017-07-27 MED ORDER — DEXAMETHASONE SODIUM PHOSPHATE 10 MG/ML IJ SOLN
INTRAMUSCULAR | Status: AC
  Filled 2017-07-27: qty 1

## 2017-07-27 MED ORDER — FENTANYL CITRATE (PF) 100 MCG/2ML IJ SOLN
INTRAMUSCULAR | Status: DC | PRN
  Administered 2017-07-27: 25 ug via INTRAVENOUS
  Administered 2017-07-27: 12.5 ug via INTRAVENOUS

## 2017-07-27 MED ORDER — PROPOFOL 10 MG/ML IV BOLUS
INTRAVENOUS | Status: AC
  Filled 2017-07-27: qty 20

## 2017-07-27 MED ORDER — PROMETHAZINE HCL 25 MG/ML IJ SOLN
6.2500 mg | INTRAMUSCULAR | Status: DC | PRN
  Filled 2017-07-27: qty 1

## 2017-07-27 MED ORDER — CEFAZOLIN SODIUM-DEXTROSE 2-4 GM/100ML-% IV SOLN
INTRAVENOUS | Status: AC
  Filled 2017-07-27: qty 100

## 2017-07-27 MED ORDER — OXYCODONE HCL 5 MG PO TABS
5.0000 mg | ORAL_TABLET | Freq: Once | ORAL | Status: AC | PRN
  Administered 2017-07-27: 5 mg via ORAL
  Filled 2017-07-27: qty 1

## 2017-07-27 MED ORDER — CEFAZOLIN SODIUM-DEXTROSE 2-4 GM/100ML-% IV SOLN
2.0000 g | INTRAVENOUS | Status: AC
  Administered 2017-07-27: 2 g via INTRAVENOUS
  Filled 2017-07-27: qty 100

## 2017-07-27 MED ORDER — OXYCODONE HCL 5 MG PO TABS
ORAL_TABLET | ORAL | Status: AC
  Filled 2017-07-27: qty 1

## 2017-07-27 MED ORDER — SODIUM CHLORIDE 0.9 % IV SOLN
INTRAVENOUS | Status: DC | PRN
  Administered 2017-07-27: 30 mL

## 2017-07-27 MED ORDER — LIDOCAINE 2% (20 MG/ML) 5 ML SYRINGE
INTRAMUSCULAR | Status: DC | PRN
  Administered 2017-07-27: 60 mg via INTRAVENOUS

## 2017-07-27 MED ORDER — DEXAMETHASONE SODIUM PHOSPHATE 10 MG/ML IJ SOLN
INTRAMUSCULAR | Status: DC | PRN
  Administered 2017-07-27: 10 mg via INTRAVENOUS

## 2017-07-27 MED ORDER — OXYCODONE HCL 5 MG/5ML PO SOLN
5.0000 mg | Freq: Once | ORAL | Status: AC | PRN
  Filled 2017-07-27: qty 5

## 2017-07-27 MED ORDER — PROPOFOL 10 MG/ML IV BOLUS
INTRAVENOUS | Status: DC | PRN
  Administered 2017-07-27: 120 mg via INTRAVENOUS

## 2017-07-27 MED ORDER — ONDANSETRON HCL 4 MG/2ML IJ SOLN
INTRAMUSCULAR | Status: AC
  Filled 2017-07-27: qty 4

## 2017-07-27 MED ORDER — ONDANSETRON HCL 4 MG/2ML IJ SOLN
INTRAMUSCULAR | Status: DC | PRN
  Administered 2017-07-27: 4 mg via INTRAVENOUS

## 2017-07-27 MED ORDER — FENTANYL CITRATE (PF) 100 MCG/2ML IJ SOLN
INTRAMUSCULAR | Status: AC
  Filled 2017-07-27: qty 2

## 2017-07-27 MED ORDER — OXYCODONE HCL 5 MG PO TABS: 5.0000 mg | ORAL_TABLET | ORAL | 0 refills | Status: DC | PRN

## 2017-07-27 MED ORDER — PHENYLEPHRINE HCL 10 MG/ML IJ SOLN
INTRAMUSCULAR | Status: DC | PRN
  Administered 2017-07-27 (×3): 120 ug via INTRAVENOUS

## 2017-07-27 MED ORDER — LIDOCAINE 2% (20 MG/ML) 5 ML SYRINGE
INTRAMUSCULAR | Status: AC
  Filled 2017-07-27: qty 5

## 2017-07-27 MED ORDER — LACTATED RINGERS IV SOLN
INTRAVENOUS | Status: DC
  Administered 2017-07-27: 12:00:00 via INTRAVENOUS
  Filled 2017-07-27: qty 1000

## 2017-07-27 MED ORDER — HYDROMORPHONE HCL 1 MG/ML IJ SOLN
0.2500 mg | INTRAMUSCULAR | Status: DC | PRN
  Filled 2017-07-27: qty 0.5

## 2017-07-27 MED ORDER — SODIUM CHLORIDE 0.9 % IR SOLN
Status: DC | PRN
  Administered 2017-07-27: 3000 mL via INTRAVESICAL

## 2017-07-27 MED FILL — oxyCODONE HCL 5 MG TABS: 5 | 5 days supply | Qty: 30 | Fill #0

## 2017-07-27 SURGICAL SUPPLY — 26 items
ADAPTER CATH WHT DISP STRL (CATHETERS) ×4 IMPLANT
BAG DRAIN URO-CYSTO SKYTR STRL (DRAIN) ×4 IMPLANT
CATH INTERMIT  6FR 70CM (CATHETERS) IMPLANT
CLOTH BEACON ORANGE TIMEOUT ST (SAFETY) ×4 IMPLANT
ELECT REM PT RETURN 9FT ADLT (ELECTROSURGICAL) ×4
ELECTRODE REM PT RTRN 9FT ADLT (ELECTROSURGICAL) ×2 IMPLANT
GLOVE BIO SURGEON STRL SZ8 (GLOVE) ×4 IMPLANT
GOWN STRL REUS W/ TWL LRG LVL3 (GOWN DISPOSABLE) ×2 IMPLANT
GOWN STRL REUS W/ TWL XL LVL3 (GOWN DISPOSABLE) ×2 IMPLANT
GOWN STRL REUS W/TWL LRG LVL3 (GOWN DISPOSABLE) ×6 IMPLANT
GOWN STRL REUS W/TWL XL LVL3 (GOWN DISPOSABLE) ×6 IMPLANT
GUIDEWIRE ANG ZIPWIRE 038X150 (WIRE) ×4 IMPLANT
GUIDEWIRE STR DUAL SENSOR (WIRE) ×4 IMPLANT
INFUSOR MANOMETER BAG 3000ML (MISCELLANEOUS) ×4 IMPLANT
IV NS IRRIG 3000ML ARTHROMATIC (IV SOLUTION) ×4 IMPLANT
KIT RM TURNOVER CYSTO AR (KITS) ×4 IMPLANT
LOOP CUT BIPOLAR 24F LRG (ELECTROSURGICAL) ×4 IMPLANT
MANIFOLD NEPTUNE II (INSTRUMENTS) ×4 IMPLANT
NEEDLE SPNL 22GX7 QUINCKE BK (NEEDLE) IMPLANT
NS IRRIG 500ML POUR BTL (IV SOLUTION) IMPLANT
PACK CYSTO (CUSTOM PROCEDURE TRAY) ×4 IMPLANT
SYR 20CC LL (SYRINGE) ×8 IMPLANT
SYRINGE 10CC LL (SYRINGE) ×4 IMPLANT
TUBE CONNECTING 12'X1/4 (SUCTIONS) ×1
TUBE CONNECTING 12X1/4 (SUCTIONS) ×3 IMPLANT
WATER STERILE IRR 3000ML UROMA (IV SOLUTION) ×4 IMPLANT

## 2017-07-27 NOTE — Anesthesia Procedure Notes (Signed)
Procedure Name: LMA Insertion Date/Time: 07/27/2017 1:22 PM Performed by: Wanita Chamberlain Pre-anesthesia Checklist: Patient identified, Timeout performed, Emergency Drugs available, Suction available and Patient being monitored Patient Re-evaluated:Patient Re-evaluated prior to induction Oxygen Delivery Method: Circle system utilized Preoxygenation: Pre-oxygenation with 100% oxygen Induction Type: IV induction Ventilation: Mask ventilation without difficulty LMA: LMA inserted LMA Size: 4.0 Number of attempts: 1 Placement Confirmation: positive ETCO2 and breath sounds checked- equal and bilateral Tube secured with: Tape Dental Injury: Teeth and Oropharynx as per pre-operative assessment

## 2017-07-27 NOTE — Anesthesia Postprocedure Evaluation (Signed)
Anesthesia Post Note  Patient: Jesse [name]  Procedure(s) Performed: Procedure(s) (LRB): CYSTOSCOPY,SELECTIVE CYTOLOGIES,RETROGRADE PYELOGRAM (Bilateral) CYSTOSCOPY WITH BIOPSY OF BLADDER AND PROSTATIC URETHRA (N/A)     Patient location during evaluation: PACU Anesthesia Type: General Level of consciousness: awake and alert Pain management: pain level controlled Vital Signs Assessment: post-procedure vital signs reviewed and stable Respiratory status: spontaneous breathing, nonlabored ventilation and respiratory function stable Cardiovascular status: blood pressure returned to baseline and stable Postop Assessment: no apparent nausea or vomiting Anesthetic complications: no    Last Vitals:  Vitals:   07/27/17 1445 07/27/17 1500  BP: (!) 152/83 (!) 160/81  Pulse: 93 91  Resp: 17 12  Temp:    SpO2: 97% 98%    Last Pain:  Vitals:   07/27/17 1445  TempSrc:   PainSc: [place] Eulises Kijowski

## 2017-07-27 NOTE — Anesthesia Preprocedure Evaluation (Addendum)
Anesthesia Evaluation  Patient identified by MRN, date of birth, ID band Patient awake    Reviewed: Allergy & Precautions, NPO status , Patient's Chart, lab work & pertinent test results  Airway Mallampati: II  TM Distance: >3 FB Neck ROM: Full    Dental no notable dental hx. (+) Teeth Intact, Dental Advisory Given, Caps,    Pulmonary neg pulmonary ROS, former smoker,    Pulmonary exam normal breath sounds clear to auscultation       Cardiovascular negative cardio ROS Normal cardiovascular exam Rhythm:Regular Rate:Normal     Neuro/Psych Anxiety negative neurological ROS  negative psych ROS   GI/Hepatic negative GI ROS, Neg liver ROS,   Endo/Other  negative endocrine ROS  Renal/GU negative Renal ROS  negative genitourinary   Musculoskeletal negative musculoskeletal ROS (+) Arthritis , Osteoarthritis,    Abdominal   Peds negative pediatric ROS (+)  Hematology negative hematology ROS (+)   Anesthesia Other Findings Hx of bladder cancer  Reproductive/Obstetrics negative OB ROS                            Anesthesia Physical Anesthesia Plan  ASA: II  Anesthesia Plan: General   Post-op Pain Management:    Induction: Intravenous  PONV Risk Score and Plan: 2 and Ondansetron and Midazolam  Airway Management Planned: LMA  Additional Equipment:   Intra-op Plan:   Post-operative Plan: Extubation in OR  Informed Consent: I have reviewed the patients History and Physical, chart, labs and discussed the procedure including the risks, benefits and alternatives for the proposed anesthesia with the patient or authorized representative who has indicated his/her understanding and acceptance.   Dental advisory given  Plan Discussed with: CRNA  Anesthesia Plan Comments:         Anesthesia Quick Evaluation

## 2017-07-27 NOTE — Transfer of Care (Signed)
Immediate Anesthesia Transfer of Care Note  Patient: Jesse [name]  Procedure(s) Performed: Procedure(s): CYSTOSCOPY,SELECTIVE CYTOLOGIES,RETROGRADE PYELOGRAM (Bilateral) CYSTOSCOPY WITH BIOPSY OF BLADDER AND PROSTATIC URETHRA (N/A)  Patient Location: PACU  Anesthesia Type:General  Level of Consciousness: awake, alert , oriented and patient cooperative  Airway & Oxygen Therapy: Patient Spontanous Breathing and Patient connected to nasal cannula oxygen  Post-op Assessment: Report given to RN and Post -op Vital signs reviewed and stable  Post vital signs: Reviewed and stable  Last Vitals:  Vitals:   07/27/17 1415 07/27/17 1418  BP: (!) 161/91 (!) 149/75  Pulse: 93 92  Resp: 18 12  Temp: 36.9 C   SpO2: 99% 99%    Last Pain:  Vitals:   07/27/17 1059  TempSrc: Oral      Patients Stated Pain Goal: 5 (84/72/07 2182)  Complications: No apparent anesthesia complications

## 2017-07-27 NOTE — Discharge Instructions (Signed)
Post Anesthesia Home Care Instructions  Activity: Get plenty of rest for the remainder of the day. A responsible individual must stay with you for 24 hours following the procedure.  For the next 24 hours, DO NOT: -Drive a car -Paediatric nurse -Drink alcoholic beverages -Take any medication unless instructed by your physician -Make any legal decisions or sign important papers.  Meals: Start with liquid foods such as gelatin or soup. Progress to regular foods as tolerated. Avoid greasy, spicy, heavy foods. If nausea and/or vomiting occur, drink only clear liquids until the nausea and/or vomiting subsides. Call your physician if vomiting continues.  Special Instructions/Symptoms: Your throat may feel dry or sore from the anesthesia or the breathing tube placed in your throat during surgery. If this causes discomfort, gargle with warm salt water. The discomfort should disappear within 24 hours.  Indwelling Urinary Catheter Care, Adult Take good care of your catheter to keep it working and to prevent problems. How to wear your catheter Attach your catheter to your leg with tape (adhesive tape) or a leg strap. Make sure it is not too tight. If you use tape, remove any bits of tape that are already on the catheter. How to wear a drainage bag You should have:  A large overnight bag.  A small leg bag.  Overnight Bag You may wear the overnight bag at any time. Always keep the bag below the level of your bladder but off the floor. When you sleep, put a clean plastic bag in a wastebasket. Then hang the bag inside the wastebasket. Leg Bag Never wear the leg bag at night. Always wear the leg bag below your knee. Keep the leg bag secure with a leg strap or tape. How to care for your skin  Clean the skin around the catheter at least once every day.  Shower every day. Do not take baths.  Put creams, lotions, or ointments on your genital area only as told by your doctor.  Do not use  powders, sprays, or lotions on your genital area. How to clean your catheter and your skin 1. Wash your hands with soap and water. 2. Wet a washcloth in warm water and gentle (mild) soap. 3. Use the washcloth to clean the skin where the catheter enters your body. Clean downward and wipe away from the catheter in small circles. Do not wipe toward the catheter. 4. Pat the area dry with a clean towel. Make sure to clean off all soap. How to care for your drainage bags Empty your drainage bag when it is ?- full or at least 2-3 times a day. Replace your drainage bag once a month or sooner if it starts to smell bad or look dirty. Do not clean your drainage bag unless told by your doctor. Emptying a drainage bag  Supplies Needed  Rubbing alcohol.  Gauze pad or cotton ball.  Tape or a leg strap.  Steps 1. Wash your hands with soap and water. 2. Separate (detach) the bag from your leg. 3. Hold the bag over the toilet or a clean container. Keep the bag below your hips and bladder. This stops pee (urine) from going back into the tube. 4. Open the pour spout at the bottom of the bag. 5. Empty the pee into the toilet or container. Do not let the pour spout touch any surface. 6. Put rubbing alcohol on a gauze pad or cotton ball. 7. Use the gauze pad or cotton ball to clean the pour spout. 8.  Close the pour spout. 9. Attach the bag to your leg with tape or a leg strap. 10. Wash your hands.  Changing a drainage bag Supplies Needed  Alcohol wipes.  A clean drainage bag.  Adhesive tape or a leg strap.  Steps 1. Wash your hands with soap and water. 2. Separate the dirty bag from your leg. 3. Pinch the rubber catheter with your fingers so that pee does not spill out. 4. Separate the catheter tube from the drainage tube where these tubes connect (at the connection valve). Do not let the tubes touch any surface. 5. Clean the end of the catheter tube with an alcohol wipe. Use a different  alcohol wipe to clean the end of the drainage tube. 6. Connect the catheter tube to the drainage tube of the clean bag. 7. Attach the new bag to the leg with adhesive tape or a leg strap. 8. Wash your hands.  How to prevent infection and other problems  Never pull on your catheter or try to remove it. Pulling can damage tissue in your body.  Always wash your hands before and after touching your catheter.  If a leg strap gets wet, replace it with a dry one.  Drink enough fluids to keep your pee clear or pale yellow, or as told by your doctor.  Do not let the drainage bag or tubing touch the floor.  Wear cotton underwear.  If you are male, wipe from front to back after you poop (have a bowel movement).  Check on the catheter often to make sure it works and the tubing is not twisted. Get help if:  Your pee is cloudy.  Your pee smells unusually bad.  Your pee is not draining into the bag.  Your tube gets clogged.  Your catheter starts to leak.  Your bladder feels full. Get help right away if:  You have redness, swelling, or pain where the catheter enters your body.  You have fluid, pus, or a bad smell coming from the area where the catheter enters your body.  The area where the catheter enters your body feels warm.  You have a fever.  You have pain in your: ? Stomach (abdomen). ? Legs. ? Lower back. ? Bladder.  You see blood fill the catheter.  Your pee is pink or red.  You feel sick to your stomach (nauseous).  You throw up (vomit).  You have chills.  Your catheter gets pulled out. This information is not intended to replace advice given to you by your health care provider. Make sure you discuss any questions you have with your health care provider. Document Released: 02/14/2013 Document Revised: 09/17/2016 Document Reviewed: 04/04/2014 Elsevier Interactive Patient Education  [employer].

## 2017-07-27 NOTE — H&P (Signed)
Urology Admission H&P  Chief Complaint: positive urine cytology  History of Present Illness: [name] is a 83yo with a hx of bladder cancer and a positive urine cytology. 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  Past Medical History:  Diagnosis Date  . Anxiety   . Arthritis   . Bladder cancer [facility]) urologist-  dr Elliot Gault [facility] in [place], [place])   dx 01/ 2016--- post TURBT and post Bladder bx 02-10-2017  . BPH (benign prostatic hyperplasia)   . Frequency of urination   . History of external beam radiation therapy    2002  prostate/ pelvis and boost w/ radioactive prostate seed implants   . History of hypercalcemia    s/p  parathyroidectomy 2003  . History of prostate cancer current urologist-  dr Elliot Gault [facility] in [place], [place])-- per lov note (care everywhere) last PSA undetectable   dx 2002--  urologist-- dr dahlstedt/ oncologist dr Valere Dross---  Gleason 7 out of 10, PSA 4.9---post Radioactive Prostate Seed Implant and External Beam Radiation therapy  . Hyperlipidemia   . Multiple lung nodules    since 2006--- followed by pcp --- dr Maudie Mercury  . Renal cyst, left   . Urine cytology abnormal   . Wears glasses   . Wears hearing aid in both ears    Past Surgical History:  Procedure Laterality Date  . CARDIOVASCULAR STRESS TEST  01/01/2004   normal nuclear perfustion study w/ no ischemia/  normal LV function and wall motion, ef 65%  . CATARACT EXTRACTION W/ INTRAOCULAR LENS  IMPLANT, BILATERAL  2010  . CYSTO/  BILATERAL RETROGRADE PYELOGRAM/  BLADDER BX'S AND FULGERATION  02-10-2017   dr Anabel Bene [facility] in [place], [place]  . FIBEROPTIC BRONCHOSCOPY  08-20-2005   dr wert   w/ Left lower lobe bx  . PARATHYROIDECTOMY  2003  . RADIOACTIVE PROSTATE SEED IMPLANTS  04-05-2001    dr Diona Fanti [facility]  . TONSILLECTOMY  child  . TRANSURETHRAL RESECTION OF BLADDER TUMOR  11-24-2014   dr Anabel Bene [facility] in [place], [place] Medications:  Prescriptions Prior to Admission  Medication Sig Dispense Refill Last Dose  . chlordiazePOXIDE (LIBRIUM) 10 MG capsule Take 10 mg by mouth every morning.    07/26/2017 at Unknown time  . Cholecalciferol (VITAMIN D3) 1000 units CAPS Take 1,000 Units by mouth daily.   Past Month at Unknown time  . Multiple Vitamins-Minerals (PRESERVISION AREDS 2) CAPS Take 1 capsule by mouth 2 (two) times daily.   Past Month at Unknown time  . nystatin-triamcinolone (MYCOLOG II) cream Apply 1 application topically 4 (four) times daily as needed.    07/26/2017 at Unknown time  . pravastatin (PRAVACHOL) 40 MG tablet Take 40 mg by mouth every morning.   3 07/26/2017 at Unknown time  . solifenacin (VESICARE) 10 MG tablet Take 10 mg by mouth every morning.    07/26/2017 at Unknown time  . tamsulosin (FLOMAX) 0.4 MG CAPS capsule Take 0.8 mg by mouth every morning.    07/26/2017 at Unknown time  . thiamine 100 MG tablet Take 100 mg by mouth daily. B-1   Past Month at Unknown time   Allergies: No Known Allergies  History reviewed. No pertinent family history. Social History:  reports that he quit smoking about 32 years ago. His smoking use included Cigarettes. He quit after 50.00 years of use. He has never used smokeless tobacco. He reports that he drinks about 4.2 oz  of alcohol per week . He reports that he does not use drugs.  Review of Systems  All other systems reviewed and are negative.   Physical Exam:  Vital signs in last 24 hours: Temp:  [97.7 F (36.5 C)] 97.7 F (36.5 C) (09/24 1059) Pulse Rate:  [100] 100 (09/24 1059) Resp:  [18] 18 (09/24 1059) BP: (130)/(67) 130/67 (09/24 1059) SpO2:  [99 %] 99 % (09/24 1059) Weight:  [62.8 kg (138 lb 8 oz)] 62.8 kg (138 lb 8 oz) (09/24 1059) Physical Exam  Constitutional: He is oriented to person, place, and time. He appears well-developed and well-nourished.  HENT:  Head: Normocephalic and atraumatic.  Eyes: Pupils are equal, round, and  reactive to light. EOM are normal.  Neck: Normal range of motion. No thyromegaly present.  Cardiovascular: Normal rate and regular rhythm.   Respiratory: Effort normal. No respiratory distress.  GI: Soft. He exhibits no distension.  Musculoskeletal: Normal range of motion. He exhibits no edema.  Neurological: He is alert and oriented to person, place, and time.  Skin: Skin is warm and dry.  Psychiatric: He has a normal mood and affect. His behavior is normal. Judgment and thought content normal.    Laboratory Data:  Results for orders placed or performed during the hospital encounter of 07/27/17 (from the past 24 hour(s))  Hemoglobin-hemacue, POC     Status: None   Collection Time: 07/27/17 12:00 PM  Result Value Ref Range   Hemoglobin 14.1 13.0 - 17.0 g/dL   No results found for this or any previous visit (from the past 240 hour(s)). Creatinine: No results for input(s): CREATININE in the last 168 hours. Baseline Creatinine: unknown  Impression/Assessment:  83yo with bladder tumor and positive urine cytology  Plan:  The risks/benefits/alternatives to selective cytologies and TURBT was explained to the patient and he understands and wsiehs to proceed with surgery  Nicolette Bang 07/27/2017, 12:47 PM

## 2017-07-27 NOTE — Op Note (Signed)
Preoperative diagnosis: positive urine cytology  Postoperative diagnosis: Same  Procedure: 1 cystoscopy 2. bilateral retrograde pyelography 3. Intraoperative fluoroscopy, under one hour, with interpretation 4. prostatic urethra biopsy with fulgeration 5. Left diagnostic ureteroscopy  Attending: Nicolette Bang  Anesthesia: General  Estimated blood loss: Minimal  Drains: 22 French foley  Specimens: right and left renal cytology Prostatic urethra biopsies x 3  Antibiotics: ancef  Findings: Ureteral orifices in normal anatomic location. No hydronephrosis or filling defects in either collecting system. No tumors in the left ureter or renal pelvis  Indications: Patient is a 83 year old male with a history of positive urine cytology. After discussing treatment options, they decided proceed with bladder biopsy and selective cytologies.  Procedure her in detail: The patient was brought to the operating room and a brief timeout was done to ensure correct patient, correct procedure, correct site. General anesthesia was administered patient was placed in dorsal lithotomy position. Their genitalia was then prepped and draped in usual sterile fashion. A rigid 92 French cystoscope was passed in the urethra and the bladder. Bladder was inspected and we noted 2 scars consistent with previous biopsy. the ureteral orifices were in the normal orthotopic locations. a 6 french ureteral catheter was then instilled into the left ureteral orifice and we then obtained a cytology. a gentle retrograde was obtained and findings noted above. We then advanced a zipwire up to the renal pelvis. We then cannulated the left ureteral orifice with a semirigidi ureteroscope. We performed ureteroscopy tot he renal pelvis and no tumor was visualized.  We then turned our attention to the right side. a 6 french ureteral catheter was then instilled into the right ureteral orifice and we then obtained a cytology. a  gentle retrograde was obtained and findings noted above. We then removed the cystoscope and placed a resectoscope into the bladder. We proceeded to obtain multiple biopsies  Of the prostatic urethra where there were areas of erythema. Hemostasis was then obtained with a bugbee. the bladder was then drained, a 22 French foley was placed and this concluded the procedure which was well tolerated by patient.  Complications: None  Condition: Stable, extubated, transferred to PACU  Plan: Patient will be discharged home and will followup in 5 days for voiding trial and pathology discussion

## 2017-07-28 ENCOUNTER — Encounter (HOSPITAL_BASED_OUTPATIENT_CLINIC_OR_DEPARTMENT_OTHER): Payer: Self-pay | Admitting: Urology

## 2017-11-13 ENCOUNTER — Other Ambulatory Visit: Payer: Self-pay | Admitting: Urology

## 2017-11-20 ENCOUNTER — Encounter (HOSPITAL_BASED_OUTPATIENT_CLINIC_OR_DEPARTMENT_OTHER): Payer: Self-pay

## 2017-11-20 ENCOUNTER — Other Ambulatory Visit: Payer: Self-pay

## 2017-11-20 NOTE — Progress Notes (Signed)
Spoke with:  Juanda Crumble NPO:  After Midnight, no gum, candy, or mints   Arrival time: 10:45 AM Labs: Hemoglobin AM medications: Librium, Pravastatin, Vesicare, Flomax Pre op orders: Yes Ride home: Lelon Frohlich (wife) [phone]

## 2017-11-20 NOTE — Progress Notes (Signed)
Spoke with:  Juanda Crumble NPO:  After Midnight, no gum, candy, or mints   Arrival time: 10:45 AM Labs: Hemoglobin AM medications: Librium, Pravastatin, Vesicare, Flomax Pre op orders: Yes Ride home: Lelon Frohlich (wife) [phone]

## 2017-12-08 ENCOUNTER — Ambulatory Visit (HOSPITAL_BASED_OUTPATIENT_CLINIC_OR_DEPARTMENT_OTHER): Payer: Medicare Other | Admitting: Anesthesiology

## 2017-12-08 ENCOUNTER — Ambulatory Visit (HOSPITAL_BASED_OUTPATIENT_CLINIC_OR_DEPARTMENT_OTHER)
Admission: RE | Admit: 2017-12-08 | Discharge: 2017-12-08 | Disposition: A | Discharge status: Home / Self Care | Payer: Medicare Other | Source: Ambulatory Visit | Attending: Urology | Admitting: Urology

## 2017-12-08 ENCOUNTER — Encounter (HOSPITAL_BASED_OUTPATIENT_CLINIC_OR_DEPARTMENT_OTHER): Payer: Self-pay | Admitting: Anesthesiology

## 2017-12-08 ENCOUNTER — Encounter (HOSPITAL_BASED_OUTPATIENT_CLINIC_OR_DEPARTMENT_OTHER)
Admission: RE | Disposition: A | Discharge status: Home / Self Care | Payer: Self-pay | Source: Ambulatory Visit | Attending: Urology

## 2017-12-08 DIAGNOSIS — J45909 Unspecified asthma, uncomplicated: Secondary | ICD-10-CM | POA: Diagnosis not present

## 2017-12-08 DIAGNOSIS — Z87891 Personal history of nicotine dependence: Secondary | ICD-10-CM | POA: Insufficient documentation

## 2017-12-08 DIAGNOSIS — E785 Hyperlipidemia, unspecified: Secondary | ICD-10-CM | POA: Insufficient documentation

## 2017-12-08 DIAGNOSIS — Z85828 Personal history of other malignant neoplasm of skin: Secondary | ICD-10-CM | POA: Diagnosis not present

## 2017-12-08 DIAGNOSIS — M1990 Unspecified osteoarthritis, unspecified site: Secondary | ICD-10-CM | POA: Insufficient documentation

## 2017-12-08 DIAGNOSIS — F419 Anxiety disorder, unspecified: Secondary | ICD-10-CM | POA: Insufficient documentation

## 2017-12-08 DIAGNOSIS — N401 Enlarged prostate with lower urinary tract symptoms: Secondary | ICD-10-CM | POA: Diagnosis not present

## 2017-12-08 DIAGNOSIS — R8290 Unspecified abnormal findings in urine: Secondary | ICD-10-CM | POA: Diagnosis present

## 2017-12-08 DIAGNOSIS — R828 Abnormal findings on cytological and histological examination of urine: Secondary | ICD-10-CM | POA: Diagnosis not present

## 2017-12-08 DIAGNOSIS — Z79899 Other long term (current) drug therapy: Secondary | ICD-10-CM | POA: Diagnosis not present

## 2017-12-08 DIAGNOSIS — R350 Frequency of micturition: Secondary | ICD-10-CM | POA: Insufficient documentation

## 2017-12-08 DIAGNOSIS — C679 Malignant neoplasm of bladder, unspecified: Secondary | ICD-10-CM | POA: Insufficient documentation

## 2017-12-08 DIAGNOSIS — Z8546 Personal history of malignant neoplasm of prostate: Secondary | ICD-10-CM | POA: Diagnosis not present

## 2017-12-08 HISTORY — DX: Pneumonia, unspecified organism: J18.9

## 2017-12-08 HISTORY — PX: CYSTOSCOPY WITH BIOPSY: SHX5122

## 2017-12-08 HISTORY — PX: CYSTOSCOPY W/ RETROGRADES: SHX1426

## 2017-12-08 HISTORY — DX: Unspecified asthma, uncomplicated: J45.909

## 2017-12-08 LAB — POCT HEMOGLOBIN-HEMACUE: HEMOGLOBIN: 13.8 g/dL (ref 13.0–17.0)

## 2017-12-08 SURGERY — CYSTOSCOPY, WITH BIOPSY
Anesthesia: General | Site: Ureter | Laterality: Bilateral

## 2017-12-08 MED ORDER — ONDANSETRON HCL 4 MG/2ML IJ SOLN
INTRAMUSCULAR | Status: DC | PRN
  Administered 2017-12-08: 4 mg via INTRAVENOUS

## 2017-12-08 MED ORDER — IOHEXOL 300 MG/ML  SOLN
INTRAMUSCULAR | Status: DC | PRN
  Administered 2017-12-08: 10 mL

## 2017-12-08 MED ORDER — ONDANSETRON HCL 4 MG/2ML IJ SOLN
4.0000 mg | Freq: Once | INTRAMUSCULAR | Status: DC | PRN
  Filled 2017-12-08: qty 2

## 2017-12-08 MED ORDER — FENTANYL CITRATE (PF) 100 MCG/2ML IJ SOLN
INTRAMUSCULAR | Status: DC | PRN
  Administered 2017-12-08: 25 ug via INTRAVENOUS
  Administered 2017-12-08: 50 ug via INTRAVENOUS
  Administered 2017-12-08: 25 ug via INTRAVENOUS

## 2017-12-08 MED ORDER — LACTATED RINGERS IV SOLN
INTRAVENOUS | Status: DC
  Administered 2017-12-08: 11:00:00 via INTRAVENOUS
  Filled 2017-12-08: qty 1000

## 2017-12-08 MED ORDER — PROPOFOL 10 MG/ML IV BOLUS
INTRAVENOUS | Status: DC | PRN
  Administered 2017-12-08: 50 mg via INTRAVENOUS
  Administered 2017-12-08: 150 mg via INTRAVENOUS

## 2017-12-08 MED ORDER — FENTANYL CITRATE (PF) 100 MCG/2ML IJ SOLN
INTRAMUSCULAR | Status: AC
  Filled 2017-12-08: qty 2

## 2017-12-08 MED ORDER — PHENYLEPHRINE 40 MCG/ML (10ML) SYRINGE FOR IV PUSH (FOR BLOOD PRESSURE SUPPORT)
PREFILLED_SYRINGE | INTRAVENOUS | Status: AC
  Filled 2017-12-08: qty 10

## 2017-12-08 MED ORDER — CEFAZOLIN SODIUM-DEXTROSE 2-4 GM/100ML-% IV SOLN
2.0000 g | INTRAVENOUS | Status: AC
  Administered 2017-12-08: 2 g via INTRAVENOUS
  Filled 2017-12-08: qty 100

## 2017-12-08 MED ORDER — DEXAMETHASONE SODIUM PHOSPHATE 10 MG/ML IJ SOLN
INTRAMUSCULAR | Status: AC
  Filled 2017-12-08: qty 1

## 2017-12-08 MED ORDER — DEXAMETHASONE SODIUM PHOSPHATE 4 MG/ML IJ SOLN
INTRAMUSCULAR | Status: DC | PRN
  Administered 2017-12-08: 10 mg via INTRAVENOUS

## 2017-12-08 MED ORDER — STERILE WATER FOR IRRIGATION IR SOLN: Status: DC | PRN

## 2017-12-08 MED ORDER — FENTANYL CITRATE (PF) 100 MCG/2ML IJ SOLN
25.0000 ug | INTRAMUSCULAR | Status: DC | PRN
  Filled 2017-12-08: qty 1

## 2017-12-08 MED ORDER — ONDANSETRON HCL 4 MG/2ML IJ SOLN
INTRAMUSCULAR | Status: AC
  Filled 2017-12-08: qty 2

## 2017-12-08 MED ORDER — CEFAZOLIN SODIUM-DEXTROSE 2-4 GM/100ML-% IV SOLN
INTRAVENOUS | Status: AC
  Filled 2017-12-08: qty 100

## 2017-12-08 MED ORDER — LIDOCAINE 2% (20 MG/ML) 5 ML SYRINGE
INTRAMUSCULAR | Status: AC
  Filled 2017-12-08: qty 5

## 2017-12-08 MED ORDER — TRAMADOL HCL 50 MG PO TABS: 50.0000 mg | ORAL_TABLET | Freq: Four times a day (QID) | ORAL | 0 refills | Status: DC | PRN

## 2017-12-08 MED ORDER — SODIUM CHLORIDE 0.9 % IR SOLN
Status: DC | PRN
  Administered 2017-12-08: 4000 mL
  Administered 2017-12-08: 500 mL

## 2017-12-08 MED ORDER — PROPOFOL 10 MG/ML IV BOLUS
INTRAVENOUS | Status: AC
  Filled 2017-12-08: qty 20

## 2017-12-08 MED ORDER — LIDOCAINE 2% (20 MG/ML) 5 ML SYRINGE
INTRAMUSCULAR | Status: DC | PRN
  Administered 2017-12-08: 100 mg via INTRAVENOUS

## 2017-12-08 SURGICAL SUPPLY — 25 items
ADAPTER CATH WHT DISP STRL (CATHETERS) IMPLANT
BAG DRAIN URO-CYSTO SKYTR STRL (DRAIN) ×4 IMPLANT
CATH INTERMIT  6FR 70CM (CATHETERS) ×4 IMPLANT
CLOTH BEACON ORANGE TIMEOUT ST (SAFETY) ×4 IMPLANT
ELECT REM PT RETURN 9FT ADLT (ELECTROSURGICAL)
ELECTRODE REM PT RTRN 9FT ADLT (ELECTROSURGICAL) IMPLANT
GLOVE BIO SURGEON STRL SZ8 (GLOVE) ×4 IMPLANT
GOWN STRL REUS W/ TWL LRG LVL3 (GOWN DISPOSABLE) IMPLANT
GOWN STRL REUS W/ TWL XL LVL3 (GOWN DISPOSABLE) IMPLANT
GOWN STRL REUS W/TWL LRG LVL3 (GOWN DISPOSABLE) ×4 IMPLANT
GOWN STRL REUS W/TWL XL LVL3 (GOWN DISPOSABLE) ×4 IMPLANT
GUIDEWIRE ANG ZIPWIRE 038X150 (WIRE) ×4 IMPLANT
GUIDEWIRE STR DUAL SENSOR (WIRE) IMPLANT
INFUSOR MANOMETER BAG 3000ML (MISCELLANEOUS) ×4 IMPLANT
IV NS IRRIG 3000ML ARTHROMATIC (IV SOLUTION) ×4 IMPLANT
KIT RM TURNOVER CYSTO AR (KITS) ×4 IMPLANT
MANIFOLD NEPTUNE II (INSTRUMENTS) ×4 IMPLANT
NEEDLE SPNL 22GX7 QUINCKE BK (NEEDLE) IMPLANT
NS IRRIG 500ML POUR BTL (IV SOLUTION) ×4 IMPLANT
PACK CYSTO (CUSTOM PROCEDURE TRAY) ×4 IMPLANT
SYR 20CC LL (SYRINGE) ×4 IMPLANT
SYRINGE 10CC LL (SYRINGE) ×4 IMPLANT
TUBE CONNECTING 12'X1/4 (SUCTIONS) ×1
TUBE CONNECTING 12X1/4 (SUCTIONS) ×3 IMPLANT
WATER STERILE IRR 3000ML UROMA (IV SOLUTION) IMPLANT

## 2017-12-08 NOTE — Transfer of Care (Signed)
Last Vitals:  Vitals:   12/08/17 1012 12/08/17 1503  BP: 134/70 (!) 147/90  Pulse: 98 81  Resp: 16 12  Temp: 37.1 C 36.5 C  SpO2: 96% 98%    Last Pain:  Vitals:   12/08/17 1012  TempSrc: Oral      Patients Stated Pain Goal: 5 (12/08/17 1042)  Immediate Anesthesia Transfer of Care Note  Patient: Jesse [name]  Procedure(s) Performed: Procedure(s) (LRB): CYSTOSCOPY WITH RENAL WASHINGS (Bilateral) CYSTOSCOPY WITH RETROGRADE PYELOGRAM (Bilateral)  Patient Location: PACU  Anesthesia Type: General  Level of Consciousness: awake, alert  and oriented  Airway & Oxygen Therapy: Patient Spontanous Breathing and Patient connected to nasal cannula oxygen  Post-op Assessment: Report given to PACU RN and Post -op Vital signs reviewed and stable  Post vital signs: Reviewed and stable  Complications: No apparent anesthesia complications

## 2017-12-08 NOTE — Op Note (Signed)
Preoperative diagnosis: positive urine cytology  Postoperative diagnosis: Same  Procedure: 1 cystoscopy 2. bilateral retrograde pyelography 3. Intraoperative fluoroscopy, under one hour, with interpretation  Attending: Nicolette Bang  Anesthesia: General  Estimated blood loss: Minimal  Drains: 20 French foley  Specimens: right and left renal cytology Bladder washing for cytology  Antibiotics: ancef  Findings:  Ureteral orifices in normal anatomic location. No hydronephrosis or filling defects in either collecting system  Indications: Patient is a 84 year old male with a history of positive urine cytology and is status post [facility] therapy. After discussing treatment options, they decided proceed with selective cytologies and possible bladder biopsy.  Procedure her in detail: The patient was brought to the operating room and a brief timeout was done to ensure correct patient, correct procedure, correct site. General anesthesia was administered patient was placed in dorsal lithotomy position. Their genitalia was then prepped and draped in usual sterile fashion. A rigid 73 French cystoscope was passed in the urethra and the bladder. Bladder was inspected and we noted 2 scars consistent with previous biopsy. the ureteral orifices were in the normal orthotopic locations. a 6 french ureteral catheter was then instilled into the left ureteral orifice and we then obtained a cytology. a gentle retrograde was obtained and findings noted above. We then turned our attention to the right side. a 6 french ureteral catheter was then instilled into the right ureteral orifice and we then obtained a cytology. a gentle retrograde was obtained and findings noted above. We did not visualize any suspicious lesions so we elected not to perform a bladder biopsy. The bladder was drained and this concluded the procedure which was well tolerated by patient.  Complications: None  Condition: Stable,  extubated, transferred to PACU  Plan: Patient will be discharged home and will followup in 1 week for pathology discussion

## 2017-12-08 NOTE — Anesthesia Procedure Notes (Signed)
Procedure Name: LMA Insertion Date/Time: 12/08/2017 2:19 PM Performed by: Wanita Chamberlain, CRNA Pre-anesthesia Checklist: Patient identified, Timeout performed, Emergency Drugs available, Suction available and Patient being monitored Patient Re-evaluated:Patient Re-evaluated prior to induction Oxygen Delivery Method: Circle system utilized Preoxygenation: Pre-oxygenation with 100% oxygen Induction Type: IV induction Ventilation: Mask ventilation without difficulty LMA: LMA inserted LMA Size: 4.0 Number of attempts: 1 Airway Equipment and Method: Bite block Placement Confirmation: breath sounds checked- equal and bilateral,  CO2 detector and positive ETCO2 Tube secured with: Tape Dental Injury: Teeth and Oropharynx as per pre-operative assessment

## 2017-12-08 NOTE — Anesthesia Preprocedure Evaluation (Addendum)
Anesthesia Evaluation  Patient identified by MRN, date of birth, ID band Patient awake    Reviewed: Allergy & Precautions, NPO status , Patient's Chart, lab work & pertinent test results  Airway Mallampati: II  TM Distance: <3 FB Neck ROM: Full    Dental  (+) Teeth Intact, Dental Advisory Given, Caps   Pulmonary asthma , former smoker,    Pulmonary exam normal breath sounds clear to auscultation       Cardiovascular Exercise Tolerance: Good negative cardio ROS Normal cardiovascular exam Rhythm:Regular Rate:Normal     Neuro/Psych PSYCHIATRIC DISORDERS Anxiety Limited neck extension  Bilateral Hearing Aids in place negative neurological ROS     GI/Hepatic negative GI ROS, Neg liver ROS,   Endo/Other  negative endocrine ROS  Renal/GU negative Renal ROS   POSITIVE URINE CYTOLOGY    Musculoskeletal negative musculoskeletal ROS (+) Arthritis , Osteoarthritis,    Abdominal   Peds  Hematology negative hematology ROS (+)   Anesthesia Other Findings Day of surgery medications reviewed with the patient.  Reproductive/Obstetrics                            Anesthesia Physical Anesthesia Plan  ASA: III  Anesthesia Plan: General   Post-op Pain Management:    Induction: Intravenous  PONV Risk Score and Plan: 3 and Dexamethasone, Ondansetron and Treatment may vary due to age or medical condition  Airway Management Planned: LMA  Additional Equipment:   Intra-op Plan:   Post-operative Plan: Extubation in OR  Informed Consent: I have reviewed the patients History and Physical, chart, labs and discussed the procedure including the risks, benefits and alternatives for the proposed anesthesia with the patient or authorized representative who has indicated his/her understanding and acceptance.   Dental advisory given  Plan Discussed with: CRNA  Anesthesia Plan Comments: (Risks/benefits of  general anesthesia discussed with patient including risk of damage to teeth, lips, gum, and tongue, nausea/vomiting, allergic reactions to medications, and the possibility of heart attack, stroke and death.  All patient questions answered.  Patient wishes to proceed.)        Anesthesia Quick Evaluation

## 2017-12-08 NOTE — Discharge Instructions (Signed)
°Post Anesthesia Home Care Instructions ° °Activity: °Get plenty of rest for the remainder of the day. A responsible individual must stay with you for 24 hours following the procedure.  °For the next 24 hours, DO NOT: °-Drive a car °-Operate machinery °-Drink alcoholic beverages °-Take any medication unless instructed by your physician °-Make any legal decisions or sign important papers. ° °Meals: °Start with liquid foods such as gelatin or soup. Progress to regular foods as tolerated. Avoid greasy, spicy, heavy foods. If nausea and/or vomiting occur, drink only clear liquids until the nausea and/or vomiting subsides. Call your physician if vomiting continues. ° °Special Instructions/Symptoms: °Your throat may feel dry or sore from the anesthesia or the breathing tube placed in your throat during surgery. If this causes discomfort, gargle with warm salt water. The discomfort should disappear within 24 hours. ° °If you had a scopolamine patch placed behind your ear for the management of post- operative nausea and/or vomiting: ° °1. The medication in the patch is effective for 72 hours, after which it should be removed.  Wrap patch in a tissue and discard in the trash. Wash hands thoroughly with soap and water. °2. You may remove the patch earlier than 72 hours if you experience unpleasant side effects which may include dry mouth, dizziness or visual disturbances. °3. Avoid touching the patch. Wash your hands with soap and water after contact with the patch. °  ° ° ° ° °Cystoscopy, Care After °Refer to this sheet in the next few weeks. These instructions provide you with information about caring for yourself after your procedure. Your health care provider may also give you more specific instructions. Your treatment has been planned according to current medical practices, but problems sometimes occur. Call your health care provider if you have any problems or questions after your procedure. °What can I expect after  the procedure? °After the procedure, it is common to have: °· Mild pain when you urinate. Pain should stop within a few minutes after you urinate. This may last for up to 1 week. °· A small amount of blood in your urine for several days. °· Feeling like you need to urinate but producing only a small amount of urine. ° °Follow these instructions at home: ° °Medicines °· Take over-the-counter and prescription medicines only as told by your health care provider. °· If you were prescribed an antibiotic medicine, take it as told by your health care provider. Do not stop taking the antibiotic even if you start to feel better. °General instructions ° °· Return to your normal activities as told by your health care provider. Ask your health care provider what activities are safe for you. °· Do not drive for 24 hours if you received a sedative. °· Watch for any blood in your urine. If the amount of blood in your urine increases, call your health care provider. °· Follow instructions from your health care provider about eating or drinking restrictions. °· If a tissue sample was removed for testing (biopsy) during your procedure, it is your responsibility to get your test results. Ask your health care provider or the department performing the test when your results will be ready. °· Drink enough fluid to keep your urine clear or pale yellow. °· Keep all follow-up visits as told by your health care provider. This is important. °Contact a health care provider if: °· You have pain that gets worse or does not get better with medicine, especially pain when you urinate. °· You have   difficulty urinating. °Get help right away if: °· You have more blood in your urine. °· You have blood clots in your urine. °· You have abdominal pain. °· You have a fever or chills. °· You are unable to urinate. °This information is not intended to replace advice given to you by your health care provider. Make sure you discuss any questions you have with  your health care provider. °Document Released: 05/09/2005 Document Revised: 03/27/2016 Document Reviewed: 09/06/2015 °Elsevier Interactive Patient Education © 2018 [employer] ° °

## 2017-12-08 NOTE — H&P (Signed)
Urology Admission H&P  Chief Complaint: positive urine cytology  History of Present Illness: [name] is a 84yo with a positive bladder cytology here for selective cystologies and possible bladder biopsy after BCG therapy. He denies any LUTS. No hematuria  Past Medical History:  Diagnosis Date  . Anxiety   . Asthma    childhood  . Bladder cancer [facility]) urologist-  dr Elliot Gault [facility] in [place], [place])   dx 01/ 2016--- post TURBT and post Bladder bx 02-10-2017  . BPH (benign prostatic hyperplasia)   . Frequency of urination   . History of external beam radiation therapy    2002  prostate/ pelvis and boost w/ radioactive prostate seed implants   . History of hypercalcemia    s/p  parathyroidectomy 2003  . History of prostate cancer current urologist-  dr Elliot Gault [facility] in [place], [place])-- per lov note (care everywhere) last PSA undetectable   dx 2002--  urologist-- dr dahlstedt/ oncologist dr Valere Dross---  Gleason 7 out of 10, PSA 4.9---post Radioactive Prostate Seed Implant and External Beam Radiation therapy  . Hyperlipidemia   . Multiple lung nodules    since 2006--- followed by pcp --- dr Maudie Mercury  . Pneumonia    10 years ago  . Renal cyst, left   . Skin cancer   . Urine cytology abnormal   . Wears glasses   . Wears hearing aid in both ears    Past Surgical History:  Procedure Laterality Date  . CARDIOVASCULAR STRESS TEST  01/01/2004   normal nuclear perfustion study w/ no ischemia/  normal LV function and wall motion, ef 65%  . CATARACT EXTRACTION W/ INTRAOCULAR LENS  IMPLANT, BILATERAL  2010  . COLONOSCOPY    . CYSTO/  BILATERAL RETROGRADE PYELOGRAM/  BLADDER BX'S AND FULGERATION  02-10-2017   dr Anabel Bene [facility] in [place], [place]  . CYSTOSCOPY W/ RETROGRADES Bilateral 07/27/2017   Procedure: CYSTOSCOPY,SELECTIVE CYTOLOGIES,RETROGRADE PYELOGRAM;  Surgeon: Cleon Gustin, MD;  Location: [facility];  Service: Urology;  Laterality:  Bilateral;  . CYSTOSCOPY WITH BIOPSY N/A 07/27/2017   Procedure: CYSTOSCOPY WITH BIOPSY OF BLADDER AND PROSTATIC URETHRA;  Surgeon: Cleon Gustin, MD;  Location: [facility];  Service: Urology;  Laterality: N/A;  . FIBEROPTIC BRONCHOSCOPY  08-20-2005   dr wert   w/ Left lower lobe bx  . PARATHYROIDECTOMY  2003  . RADIOACTIVE PROSTATE SEED IMPLANTS  04-05-2001    dr Diona Fanti [facility]  . TONSILLECTOMY  child  . TRANSURETHRAL RESECTION OF BLADDER TUMOR  11-24-2014   dr Anabel Bene [facility] in [place], [place]  . UPPER GI ENDOSCOPY      Home Medications:  Current Facility-Administered Medications  Medication Dose Route Frequency Provider Last Rate Last Dose  . ceFAZolin (ANCEF) IVPB 2g/100 mL premix  2 g Intravenous 30 min Pre-Op Debany Vantol, Candee Furbish, MD      . lactated ringers infusion   Intravenous Continuous Ellender, Karyl Kinnier, MD 50 mL/hr at 12/08/17 1054    . sodium chloride irrigation 0.9 %    PRN Cleon Gustin, MD   4,000 mL at 12/08/17 1257  . sterile water for irrigation for irrigation    PRN Alyson Ingles Candee Furbish, MD   500 mL at 12/08/17 1258   Allergies: No Known Allergies  History reviewed. No pertinent family history. Social History:  reports that he quit smoking about 33 years ago. His smoking use included cigarettes. He quit after 50.00 years of  use. he has never used smokeless tobacco. He reports that he drinks about 4.2 oz of alcohol per week. He reports that he does not use drugs.  Review of Systems  All other systems reviewed and are negative.   Physical Exam:  Vital signs in last 24 hours: Temp:  [98.7 F (37.1 C)] 98.7 F (37.1 C) (02/05 1012) Pulse Rate:  [98] 98 (02/05 1012) Resp:  [16] 16 (02/05 1012) BP: (134)/(70) 134/70 (02/05 1012) SpO2:  [96 %] 96 % (02/05 1012) Weight:  [62.8 kg (138 lb 8 oz)] 62.8 kg (138 lb 8 oz) (02/05 1012) Physical Exam  Constitutional: He is oriented to person, place, and time. He  appears well-developed and well-nourished.  HENT:  Head: Normocephalic and atraumatic.  Eyes: EOM are normal. Pupils are equal, round, and reactive to light.  Neck: Normal range of motion. No thyromegaly present.  Cardiovascular: Normal rate and regular rhythm.  Respiratory: Effort normal. No respiratory distress.  GI: Soft. He exhibits no distension.  Musculoskeletal: Normal range of motion. He exhibits no edema.  Neurological: He is alert and oriented to person, place, and time.  Skin: Skin is warm and dry.  Psychiatric: He has a normal mood and affect. His behavior is normal. Judgment and thought content normal.    Laboratory Data:  Results for orders placed or performed during the hospital encounter of 12/08/17 (from the past 24 hour(s))  Hemoglobin-hemacue, POC     Status: None   Collection Time: 12/08/17 10:59 AM  Result Value Ref Range   Hemoglobin 13.8 13.0 - 17.0 g/dL   No results found for this or any previous visit (from the past 240 hour(s)). Creatinine: No results for input(s): CREATININE in the last 168 hours. Baseline Creatinine: unknwon  Impression/Assessment:  85yo with a positive urine cytology  Plan:  The risks/benefits/alternatives to bilateral retrogrades, selective urine cytologies and possibel bladder biopsy was explained to the patient and he understands and wishes to proceed with surgery  Nicolette Bang 12/08/2017, 1:50 PM

## 2017-12-08 NOTE — Anesthesia Postprocedure Evaluation (Signed)
Anesthesia Post Note  Patient: Jesse [name]  Procedure(s) Performed: CYSTOSCOPY WITH RENAL WASHINGS (Bilateral Renal) CYSTOSCOPY WITH RETROGRADE PYELOGRAM (Bilateral Ureter)     Patient location during evaluation: PACU Anesthesia Type: General Level of consciousness: awake and alert Pain management: pain level controlled Vital Signs Assessment: post-procedure vital signs reviewed and stable Respiratory status: spontaneous breathing, nonlabored ventilation, respiratory function stable and patient connected to nasal cannula oxygen Cardiovascular status: blood pressure returned to baseline and stable Postop Assessment: no apparent nausea or vomiting Anesthetic complications: no    Last Vitals:  Vitals:   12/08/17 1530 12/08/17 1549  BP: (!) 138/103 (!) 156/58  Pulse: 75 65  Resp: 10 12  Temp:  36.7 C  SpO2: 99% 99%    Last Pain:  Vitals:   12/08/17 1012  TempSrc: Oral                 Tiajuana Amass

## 2017-12-09 ENCOUNTER — Encounter (HOSPITAL_BASED_OUTPATIENT_CLINIC_OR_DEPARTMENT_OTHER): Payer: Self-pay | Admitting: Urology

## 2017-12-09 LAB — URINE CULTURE: Culture: NO GROWTH

## 2018-03-24 ENCOUNTER — Other Ambulatory Visit: Payer: Self-pay | Admitting: Urology

## 2018-04-02 ENCOUNTER — Encounter (HOSPITAL_BASED_OUTPATIENT_CLINIC_OR_DEPARTMENT_OTHER): Payer: Self-pay | Admitting: *Deleted

## 2018-04-02 ENCOUNTER — Other Ambulatory Visit: Payer: Self-pay

## 2018-04-02 NOTE — Progress Notes (Signed)
Spoke w/ pt via phone for pre-op interview.  Npo after mn w/ exception clear liquids until 0630 (no cream/ milk products).  Arrive at 1030.  Will take am meds w/ sips of water dos.

## 2018-04-08 ENCOUNTER — Ambulatory Visit (HOSPITAL_BASED_OUTPATIENT_CLINIC_OR_DEPARTMENT_OTHER)
Admission: RE | Admit: 2018-04-08 | Discharge: 2018-04-08 | Disposition: A | Discharge status: Home / Self Care | Payer: Medicare Other | Source: Ambulatory Visit | Attending: Urology | Admitting: Urology

## 2018-04-08 ENCOUNTER — Ambulatory Visit (HOSPITAL_BASED_OUTPATIENT_CLINIC_OR_DEPARTMENT_OTHER): Payer: Medicare Other | Admitting: Anesthesiology

## 2018-04-08 ENCOUNTER — Other Ambulatory Visit: Payer: Self-pay

## 2018-04-08 ENCOUNTER — Encounter (HOSPITAL_BASED_OUTPATIENT_CLINIC_OR_DEPARTMENT_OTHER)
Admission: RE | Disposition: A | Discharge status: Home / Self Care | Payer: Self-pay | Source: Ambulatory Visit | Attending: Urology

## 2018-04-08 ENCOUNTER — Encounter (HOSPITAL_BASED_OUTPATIENT_CLINIC_OR_DEPARTMENT_OTHER): Payer: Self-pay | Admitting: *Deleted

## 2018-04-08 DIAGNOSIS — N401 Enlarged prostate with lower urinary tract symptoms: Secondary | ICD-10-CM | POA: Insufficient documentation

## 2018-04-08 DIAGNOSIS — Z8546 Personal history of malignant neoplasm of prostate: Secondary | ICD-10-CM | POA: Insufficient documentation

## 2018-04-08 DIAGNOSIS — Z79899 Other long term (current) drug therapy: Secondary | ICD-10-CM | POA: Insufficient documentation

## 2018-04-08 DIAGNOSIS — D303 Benign neoplasm of bladder: Secondary | ICD-10-CM | POA: Diagnosis present

## 2018-04-08 DIAGNOSIS — F419 Anxiety disorder, unspecified: Secondary | ICD-10-CM | POA: Diagnosis not present

## 2018-04-08 DIAGNOSIS — Z8551 Personal history of malignant neoplasm of bladder: Secondary | ICD-10-CM | POA: Diagnosis not present

## 2018-04-08 DIAGNOSIS — E785 Hyperlipidemia, unspecified: Secondary | ICD-10-CM | POA: Insufficient documentation

## 2018-04-08 DIAGNOSIS — R350 Frequency of micturition: Secondary | ICD-10-CM | POA: Diagnosis not present

## 2018-04-08 DIAGNOSIS — Z87891 Personal history of nicotine dependence: Secondary | ICD-10-CM | POA: Insufficient documentation

## 2018-04-08 HISTORY — DX: Personal history of other malignant neoplasm of skin: Z85.828

## 2018-04-08 HISTORY — DX: Neoplasm of unspecified behavior of bladder: D49.4

## 2018-04-08 HISTORY — DX: Personal history of other diseases of the respiratory system: Z87.09

## 2018-04-08 HISTORY — PX: TRANSURETHRAL RESECTION OF BLADDER TUMOR: SHX2575

## 2018-04-08 SURGERY — TURBT (TRANSURETHRAL RESECTION OF BLADDER TUMOR)
Anesthesia: General

## 2018-04-08 MED ORDER — FENTANYL CITRATE (PF) 100 MCG/2ML IJ SOLN
INTRAMUSCULAR | Status: AC
  Filled 2018-04-08: qty 2

## 2018-04-08 MED ORDER — FENTANYL CITRATE (PF) 100 MCG/2ML IJ SOLN
INTRAMUSCULAR | Status: DC | PRN
  Administered 2018-04-08 (×2): 50 ug via INTRAVENOUS

## 2018-04-08 MED ORDER — HYDROMORPHONE HCL 1 MG/ML IJ SOLN
0.2500 mg | INTRAMUSCULAR | Status: DC | PRN
  Administered 2018-04-08 (×2): 0.25 mg via INTRAVENOUS
  Filled 2018-04-08: qty 0.5

## 2018-04-08 MED ORDER — CEFAZOLIN SODIUM-DEXTROSE 2-4 GM/100ML-% IV SOLN
2.0000 g | INTRAVENOUS | Status: AC
  Administered 2018-04-08: 2 g via INTRAVENOUS
  Filled 2018-04-08: qty 100

## 2018-04-08 MED ORDER — ARTIFICIAL TEARS OPHTHALMIC OINT
TOPICAL_OINTMENT | OPHTHALMIC | Status: AC
Start: 2018-04-08 — End: ?
  Filled 2018-04-08: qty 3.5

## 2018-04-08 MED ORDER — DEXAMETHASONE SODIUM PHOSPHATE 10 MG/ML IJ SOLN
INTRAMUSCULAR | Status: AC
Start: 2018-04-08 — End: ?
  Filled 2018-04-08: qty 1

## 2018-04-08 MED ORDER — CEFAZOLIN SODIUM-DEXTROSE 2-4 GM/100ML-% IV SOLN
INTRAVENOUS | Status: AC
  Filled 2018-04-08: qty 100

## 2018-04-08 MED ORDER — LACTATED RINGERS IV SOLN
INTRAVENOUS | Status: DC
  Administered 2018-04-08: 11:00:00 via INTRAVENOUS
  Filled 2018-04-08: qty 1000

## 2018-04-08 MED ORDER — TRAMADOL HCL 50 MG PO TABS: 50.0000 mg | ORAL_TABLET | Freq: Four times a day (QID) | ORAL | 0 refills | Status: DC | PRN

## 2018-04-08 MED ORDER — ONDANSETRON HCL 4 MG/2ML IJ SOLN
INTRAMUSCULAR | Status: DC | PRN
  Administered 2018-04-08: 4 mg via INTRAVENOUS

## 2018-04-08 MED ORDER — PROPOFOL 10 MG/ML IV BOLUS
INTRAVENOUS | Status: DC | PRN
  Administered 2018-04-08: 150 mg via INTRAVENOUS

## 2018-04-08 MED ORDER — MEPERIDINE HCL 25 MG/ML IJ SOLN
6.2500 mg | INTRAMUSCULAR | Status: DC | PRN
  Filled 2018-04-08: qty 1

## 2018-04-08 MED ORDER — SODIUM CHLORIDE 0.9 % IR SOLN
Status: DC | PRN
  Administered 2018-04-08: 3000 mL via INTRAVESICAL

## 2018-04-08 MED ORDER — LIDOCAINE 2% (20 MG/ML) 5 ML SYRINGE
INTRAMUSCULAR | Status: DC | PRN
  Administered 2018-04-08: 100 mg via INTRAVENOUS

## 2018-04-08 MED ORDER — PROPOFOL 10 MG/ML IV BOLUS
INTRAVENOUS | Status: AC
  Filled 2018-04-08: qty 20

## 2018-04-08 MED ORDER — LIDOCAINE 2% (20 MG/ML) 5 ML SYRINGE
INTRAMUSCULAR | Status: AC
Start: 2018-04-08 — End: ?
  Filled 2018-04-08: qty 5

## 2018-04-08 MED ORDER — DEXAMETHASONE SODIUM PHOSPHATE 4 MG/ML IJ SOLN
INTRAMUSCULAR | Status: DC | PRN
  Administered 2018-04-08: 10 mg via INTRAVENOUS

## 2018-04-08 MED ORDER — ONDANSETRON HCL 4 MG/2ML IJ SOLN
4.0000 mg | Freq: Once | INTRAMUSCULAR | Status: DC | PRN
  Filled 2018-04-08: qty 2

## 2018-04-08 MED ORDER — HYDROMORPHONE HCL 1 MG/ML IJ SOLN
INTRAMUSCULAR | Status: AC
  Filled 2018-04-08: qty 1

## 2018-04-08 MED ORDER — ONDANSETRON HCL 4 MG/2ML IJ SOLN
INTRAMUSCULAR | Status: AC
  Filled 2018-04-08: qty 2

## 2018-04-08 SURGICAL SUPPLY — 20 items
BAG DRAIN URO-CYSTO SKYTR STRL (DRAIN) ×3 IMPLANT
BAG URINE DRAINAGE (UROLOGICAL SUPPLIES) IMPLANT
BAG URINE LEG 19OZ MD ST LTX (BAG) IMPLANT
CATH FOLEY 3WAY 30CC 22F (CATHETERS) ×3 IMPLANT
CLOTH BEACON ORANGE TIMEOUT ST (SAFETY) ×3 IMPLANT
ELECT REM PT RETURN 9FT ADLT (ELECTROSURGICAL) ×3
ELECTRODE REM PT RTRN 9FT ADLT (ELECTROSURGICAL) ×1 IMPLANT
EVACUATOR MICROVAS BLADDER (UROLOGICAL SUPPLIES) IMPLANT
GLOVE BIO SURGEON STRL SZ8 (GLOVE) ×3 IMPLANT
GOWN STRL REUS W/TWL LRG LVL3 (GOWN DISPOSABLE) ×3 IMPLANT
GOWN STRL REUS W/TWL XL LVL3 (GOWN DISPOSABLE) ×3 IMPLANT
HOLDER FOLEY CATH W/STRAP (MISCELLANEOUS) ×3 IMPLANT
KIT TURNOVER CYSTO (KITS) ×3 IMPLANT
MANIFOLD NEPTUNE II (INSTRUMENTS) IMPLANT
PACK CYSTO (CUSTOM PROCEDURE TRAY) ×3 IMPLANT
PLUG CATH AND CAP STER (CATHETERS) IMPLANT
SYR 30ML LL (SYRINGE) ×3 IMPLANT
SYRINGE IRR TOOMEY STRL 70CC (SYRINGE) IMPLANT
TUBE CONNECTING 12'X1/4 (SUCTIONS)
TUBE CONNECTING 12X1/4 (SUCTIONS) IMPLANT

## 2018-04-08 NOTE — Transfer of Care (Signed)
  Last Vitals:  Vitals Value Taken Time  BP    Temp    Pulse 87 04/08/2018  1:28 PM  Resp    SpO2 100 % 04/08/2018  1:28 PM  Vitals shown include unvalidated device data.  Last Pain:  Vitals:   04/08/18 1054  TempSrc: Oral  PainSc: 0-No pain      Patients Stated Pain Goal: 5 (04/08/18 1054)  Immediate Anesthesia Transfer of Care Note  Patient: Jesse [name]  Procedure(s) Performed: Procedure(s) (LRB): TRANSURETHRAL RESECTION OF BLADDER TUMOR (TURBT) (N/A)  Patient Location: PACU  Anesthesia Type: General  Level of Consciousness: awake, alert  and oriented  Airway & Oxygen Therapy: Patient Spontanous Breathing and Patient connected to nasal cannula oxygen  Post-op Assessment: Report given to PACU RN and Post -op Vital signs reviewed and stable  Post vital signs: Reviewed and stable  Complications: No apparent anesthesia complications

## 2018-04-08 NOTE — Op Note (Signed)
.  Preoperative diagnosis: bladder tumor  Postoperative diagnosis: Same  Procedure: 1 cystoscopy 2.  Transurethral resection of bladder tumor, small  Attending: Rosie Fate  Anesthesia: General  Estimated blood loss: Minimal  Drains: 22 French foley  Specimens: posterior wall bladder tumor  Antibiotics: ancef  Findings:  2cm sessile posterior wall tumor.    Indications: Patient is a 84 year old male with a history of bladder tumor found on office cystoscopy.  After discussing treatment options, they decided proceed with transurethral resection of a bladder tumor.  Procedure her in detail: The patient was brought to the operating room and a brief timeout was done to ensure correct patient, correct procedure, correct site.  General anesthesia was administered patient was placed in dorsal lithotomy position.  Their genitalia was then prepped and draped in usual sterile fashion.  A rigid 23 French cystoscope was passed in the urethra and the bladder.  Bladder was inspected and we noted a 2cm posterior wall bladder tumor.  the ureteral orifices were in the normal orthotopic locations.  We then removed the cystoscope and placed a resectoscope into the bladder. Using the bipolar resectoscope we removed the bladder tumor down to the base. Hemostasis was then obtained with electrocautery. We then removed the bladder tumor chips and sent them for pathology. We then re-inspected the bladder and found no residula bleeding.  the bladder was then drained, a 22 French foley was placed and this concluded the procedure which was well tolerated by patient.  Complications: None  Condition: Stable, extubated, transferred to PACU  Plan: Patient is to be discharged home and followup in 5 days for foley catheter removal and pathology discussion.

## 2018-04-08 NOTE — H&P (Signed)
Urology Admission H&P  Chief Complaint: bladder tumor  History of Present Illness: [name] is a 84yo with a hx of bladder cancer who was found to have a bladder tumor recurrance on office cystoscopy. No LUTS. No hematuria  Past Medical History:  Diagnosis Date  . Anxiety   . Bladder cancer [facility]) urologist-  dr Elliot Gault [facility] in [place], [place])   dx 01/ 2016--- post TURBT and post Bladder bx 02-10-2017  . Bladder tumor   . BPH (benign prostatic hyperplasia)   . Frequency of urination   . History of asthma    childhood  . History of external beam radiation therapy    2002  prostate/ pelvis and boost w/ radioactive prostate seed implants   . History of hypercalcemia    s/p  parathyroidectomy 2003  . History of prostate cancer current urologist-  dr Elliot Gault [facility] in [place], [place])-- per lov note (care everywhere) last PSA undetectable   dx 2002--  urologist-- dr dahlstedt/ oncologist dr Valere Dross---  Gleason 7 out of 10, PSA 4.9---post Radioactive Prostate Seed Implant and External Beam Radiation therapy  . History of skin cancer   . Hyperlipidemia   . Multiple lung nodules    since 2006--- followed by pcp --- dr Maudie Mercury  . Renal cyst, left   . Wears glasses   . Wears hearing aid in both ears    Past Surgical History:  Procedure Laterality Date  . CARDIOVASCULAR STRESS TEST  01/01/2004   normal nuclear perfustion study w/ no ischemia/  normal LV function and wall motion, ef 65%  . CATARACT EXTRACTION W/ INTRAOCULAR LENS  IMPLANT, BILATERAL  2010  . COLONOSCOPY    . CYSTO/  BILATERAL RETROGRADE PYELOGRAM/  BLADDER BX'S AND FULGERATION  02-10-2017   dr Anabel Bene [facility] in [place], [place]  . CYSTOSCOPY W/ RETROGRADES Bilateral 07/27/2017   Procedure: CYSTOSCOPY,SELECTIVE CYTOLOGIES,RETROGRADE PYELOGRAM;  Surgeon: Cleon Gustin, MD;  Location: [facility];  Service: Urology;  Laterality: Bilateral;  . CYSTOSCOPY W/ RETROGRADES Bilateral  12/08/2017   Procedure: CYSTOSCOPY WITH RETROGRADE PYELOGRAM;  Surgeon: Cleon Gustin, MD;  Location: [facility];  Service: Urology;  Laterality: Bilateral;  . CYSTOSCOPY WITH BIOPSY N/A 07/27/2017   Procedure: CYSTOSCOPY WITH BIOPSY OF BLADDER AND PROSTATIC URETHRA;  Surgeon: Cleon Gustin, MD;  Location: [facility];  Service: Urology;  Laterality: N/A;  . CYSTOSCOPY WITH BIOPSY Bilateral 12/08/2017   Procedure: CYSTOSCOPY WITH RENAL WASHINGS;  Surgeon: Cleon Gustin, MD;  Location: [facility];  Service: Urology;  Laterality: Bilateral;  . FIBEROPTIC BRONCHOSCOPY  08-20-2005   dr wert   w/ Left lower lobe bx  . PARATHYROIDECTOMY  2003  . RADIOACTIVE PROSTATE SEED IMPLANTS  04-05-2001    dr Diona Fanti [facility]  . TONSILLECTOMY  child  . TRANSURETHRAL RESECTION OF BLADDER TUMOR  11-24-2014   dr Anabel Bene [facility] in [place], [place]  . UPPER GI ENDOSCOPY      Home Medications:  Current Facility-Administered Medications  Medication Dose Route Frequency Provider Last Rate Last Dose  . ceFAZolin (ANCEF) IVPB 2g/100 mL premix  2 g Intravenous 30 min Pre-Op Alyson Ingles Candee Furbish, MD      . lactated ringers infusion   Intravenous Continuous Lillia Abed, MD 50 mL/hr at 04/08/18 1109    . sodium chloride irrigation 0.9 %    PRN Cleon Gustin, MD   3,000 mL at 04/08/18 1201   Allergies:  No Known Allergies  History reviewed. No pertinent family history. Social History:  reports that he quit smoking about 33 years ago. His smoking use included cigarettes. He quit after 50.00 years of use. He has never used smokeless tobacco. He reports that he drinks about 4.2 oz of alcohol per week. He reports that he does not use drugs.  Review of Systems  All other systems reviewed and are negative.   Physical Exam:  Vital signs in last 24 hours: Temp:  [97.9 F (36.6 C)] 97.9 F (36.6 C) (06/06 1054) Resp:  [18] 18  (06/06 1054) BP: (138)/(77) 138/77 (06/06 1054) SpO2:  [99 %] 99 % (06/06 1054) Weight:  [63.2 kg (139 lb 6 oz)] 63.2 kg (139 lb 6 oz) (06/06 1054) Physical Exam  Constitutional: He is oriented to person, place, and time. He appears well-developed and well-nourished.  HENT:  Head: Normocephalic and atraumatic.  Eyes: Pupils are equal, round, and reactive to light. EOM are normal.  Neck: Normal range of motion. No thyromegaly present.  Cardiovascular: Normal rate and regular rhythm.  Respiratory: Effort normal. No respiratory distress.  GI: Soft. He exhibits no distension.  Musculoskeletal: Normal range of motion. He exhibits no edema.  Neurological: He is alert and oriented to person, place, and time.  Skin: Skin is warm and dry.  Psychiatric: He has a normal mood and affect. His behavior is normal. Judgment and thought content normal.    Laboratory Data:  No results found for this or any previous visit (from the past 24 hour(s)). No results found for this or any previous visit (from the past 240 hour(s)). Creatinine: No results for input(s): CREATININE in the last 168 hours. Baseline Creatinine: unknwon  Impression/Assessment:  85yo with recurrent bladder tumor  Plan:  The risks/benefits/alternatives to TURBT was explained to the patient and he understands and wishes to proceed with surgery  Nicolette Bang 04/08/2018, 12:39 PM

## 2018-04-08 NOTE — Anesthesia Preprocedure Evaluation (Signed)
Anesthesia Evaluation  Patient identified by MRN, date of birth, ID band Patient awake    Reviewed: Allergy & Precautions, NPO status , Patient's Chart, lab work & pertinent test results  Airway Mallampati: I  TM Distance: >3 FB Neck ROM: Full    Dental   Pulmonary former smoker,    Pulmonary exam normal        Cardiovascular Normal cardiovascular exam     Neuro/Psych Anxiety    GI/Hepatic   Endo/Other    Renal/GU      Musculoskeletal   Abdominal   Peds  Hematology   Anesthesia Other Findings   Reproductive/Obstetrics                             Anesthesia Physical Anesthesia Plan  ASA: II  Anesthesia Plan: General   Post-op Pain Management:    Induction: Intravenous  PONV Risk Score and Plan: 2 and Ondansetron and Treatment may vary due to age or medical condition  Airway Management Planned: LMA  Additional Equipment:   Intra-op Plan:   Post-operative Plan: Extubation in OR  Informed Consent: I have reviewed the patients History and Physical, chart, labs and discussed the procedure including the risks, benefits and alternatives for the proposed anesthesia with the patient or authorized representative who has indicated his/her understanding and acceptance.     Plan Discussed with: CRNA and Surgeon  Anesthesia Plan Comments:         Anesthesia Quick Evaluation

## 2018-04-08 NOTE — Anesthesia Procedure Notes (Signed)
Procedure Name: LMA Insertion Date/Time: 04/08/2018 12:50 PM Performed by: Lillia Abed, MD Pre-anesthesia Checklist: Patient identified, Emergency Drugs available, Suction available and Patient being monitored Patient Re-evaluated:Patient Re-evaluated prior to induction Oxygen Delivery Method: Circle system utilized Preoxygenation: Pre-oxygenation with 100% oxygen Induction Type: IV induction Ventilation: Mask ventilation without difficulty LMA: LMA inserted LMA Size: 4.0 Number of attempts: 1 Airway Equipment and Method: Bite block Placement Confirmation: positive ETCO2 Tube secured with: Tape Dental Injury: Teeth and Oropharynx as per pre-operative assessment

## 2018-04-08 NOTE — Discharge Instructions (Signed)
Indwelling Urinary Catheter Care, Adult Take good care of your catheter to keep it working and to prevent problems. How to wear your catheter Attach your catheter to your leg with tape (adhesive tape) or a leg strap. Make sure it is not too tight. If you use tape, remove any bits of tape that are already on the catheter. How to wear a drainage bag You should have:  A large overnight bag.  A small leg bag.  Overnight Bag You may wear the overnight bag at any time. Always keep the bag below the level of your bladder but off the floor. When you sleep, put a clean plastic bag in a wastebasket. Then hang the bag inside the wastebasket. Leg Bag Never wear the leg bag at night. Always wear the leg bag below your knee. Keep the leg bag secure with a leg strap or tape. How to care for your skin  Clean the skin around the catheter at least once every day.  Shower every day. Do not take baths.  Put creams, lotions, or ointments on your genital area only as told by your doctor.  Do not use powders, sprays, or lotions on your genital area. How to clean your catheter and your skin 1. Wash your hands with soap and water. 2. Wet a washcloth in warm water and gentle (mild) soap. 3. Use the washcloth to clean the skin where the catheter enters your body. Clean downward and wipe away from the catheter in small circles. Do not wipe toward the catheter. 4. Pat the area dry with a clean towel. Make sure to clean off all soap. How to care for your drainage bags Empty your drainage bag when it is ?- full or at least 2-3 times a day. Replace your drainage bag once a month or sooner if it starts to smell bad or look dirty. Do not clean your drainage bag unless told by your doctor. Emptying a drainage bag  Supplies Needed  Rubbing alcohol.  Gauze pad or cotton ball.  Tape or a leg strap.  Steps 1. Wash your hands with soap and water. 2. Separate (detach) the bag from your leg. 3. Hold the bag over  the toilet or a clean container. Keep the bag below your hips and bladder. This stops pee (urine) from going back into the tube. 4. Open the pour spout at the bottom of the bag. 5. Empty the pee into the toilet or container. Do not let the pour spout touch any surface. 6. Put rubbing alcohol on a gauze pad or cotton ball. 7. Use the gauze pad or cotton ball to clean the pour spout. 8. Close the pour spout. 9. Attach the bag to your leg with tape or a leg strap. 10. Wash your hands.  Changing a drainage bag Supplies Needed  Alcohol wipes.  A clean drainage bag.  Adhesive tape or a leg strap.  Steps 1. Wash your hands with soap and water. 2. Separate the dirty bag from your leg. 3. Pinch the rubber catheter with your fingers so that pee does not spill out. 4. Separate the catheter tube from the drainage tube where these tubes connect (at the connection valve). Do not let the tubes touch any surface. 5. Clean the end of the catheter tube with an alcohol wipe. Use a different alcohol wipe to clean the end of the drainage tube. 6. Connect the catheter tube to the drainage tube of the clean bag. 7. Attach the new bag to  the leg with adhesive tape or a leg strap. °8. Wash your hands. ° °How to prevent infection and other problems °· Never pull on your catheter or try to remove it. Pulling can damage tissue in your body. °· Always wash your hands before and after touching your catheter. °· If a leg strap gets wet, replace it with a dry one. °· Drink enough fluids to keep your pee clear or pale yellow, or as told by your doctor. °· Do not let the drainage bag or tubing touch the floor. °· Wear cotton underwear. °· If you are male, wipe from front to back after you poop (have a bowel movement). °· Check on the catheter often to make sure it works and the tubing is not twisted. °Get help if: °· Your pee is cloudy. °· Your pee smells unusually bad. °· Your pee is not draining into the bag. °· Your  tube gets clogged. °· Your catheter starts to leak. °· Your bladder feels full. °Get help right away if: °· You have redness, swelling, or pain where the catheter enters your body. °· You have fluid, pus, or a bad smell coming from the area where the catheter enters your body. °· The area where the catheter enters your body feels warm. °· You have a fever. °· You have pain in your: °? Stomach (abdomen). °? Legs. °? Lower back. °? Bladder. °· You see blood fill the catheter. °· Your pee is pink or red. °· You feel sick to your stomach (nauseous). °· You throw up (vomit). °· You have chills. °· Your catheter gets pulled out. °This information is not intended to replace advice given to you by your health care provider. Make sure you discuss any questions you have with your health care provider. °Document Released: 02/14/2013 Document Revised: 09/17/2016 Document Reviewed: 04/04/2014 °Elsevier Interactive Patient Education © 2018 [employer] ° ° °Post Anesthesia Home Care Instructions ° °Activity: °Get plenty of rest for the remainder of the day. A responsible individual must stay with you for 24 hours following the procedure.  °For the next 24 hours, DO NOT: °-Drive a car °-Operate machinery °-Drink alcoholic beverages °-Take any medication unless instructed by your physician °-Make any legal decisions or sign important papers. ° °Meals: °Start with liquid foods such as gelatin or soup. Progress to regular foods as tolerated. Avoid greasy, spicy, heavy foods. If nausea and/or vomiting occur, drink only clear liquids until the nausea and/or vomiting subsides. Call your physician if vomiting continues. ° °Special Instructions/Symptoms: °Your throat may feel dry or sore from the anesthesia or the breathing tube placed in your throat during surgery. If this causes discomfort, gargle with warm salt water. The discomfort should disappear within 24 hours. ° ° °  ° ° °

## 2018-04-09 ENCOUNTER — Encounter (HOSPITAL_BASED_OUTPATIENT_CLINIC_OR_DEPARTMENT_OTHER): Payer: Self-pay | Admitting: Urology

## 2018-04-09 NOTE — Anesthesia Postprocedure Evaluation (Signed)
Anesthesia Post Note  Patient: Jesse [name]  Procedure(s) Performed: TRANSURETHRAL RESECTION OF BLADDER TUMOR (TURBT) (N/A )     Patient location during evaluation: PACU Anesthesia Type: General Level of consciousness: awake and alert Pain management: pain level controlled Vital Signs Assessment: post-procedure vital signs reviewed and stable Respiratory status: spontaneous breathing, nonlabored ventilation, respiratory function stable and patient connected to nasal cannula oxygen Cardiovascular status: blood pressure returned to baseline and stable Postop Assessment: no apparent nausea or vomiting Anesthetic complications: no    Last Vitals:  Vitals:   04/08/18 1415 04/08/18 1515  BP: (!) 149/74 (!) 156/83  Pulse: (!) 101 93  Resp: 20 16  Temp:  36.9 C  SpO2: 96% 98%    Last Pain:  Vitals:   04/08/18 1515  TempSrc: Oral  PainSc: 2                  Maika Kaczmarek DAVID

## 2019-02-17 ENCOUNTER — Other Ambulatory Visit: Payer: Self-pay | Admitting: Urology

## 2019-02-25 ENCOUNTER — Emergency Department (HOSPITAL_BASED_OUTPATIENT_CLINIC_OR_DEPARTMENT_OTHER)
Admission: EM | Admit: 2019-02-25 | Discharge: 2019-02-25 | Disposition: A | Discharge status: Home / Self Care | Payer: Medicare Other | Attending: Emergency Medicine | Admitting: Emergency Medicine

## 2019-02-25 ENCOUNTER — Encounter (HOSPITAL_COMMUNITY): Payer: Self-pay | Admitting: *Deleted

## 2019-02-25 ENCOUNTER — Encounter (HOSPITAL_BASED_OUTPATIENT_CLINIC_OR_DEPARTMENT_OTHER): Payer: Self-pay | Admitting: *Deleted

## 2019-02-25 ENCOUNTER — Other Ambulatory Visit: Payer: Self-pay

## 2019-02-25 DIAGNOSIS — Z79899 Other long term (current) drug therapy: Secondary | ICD-10-CM | POA: Insufficient documentation

## 2019-02-25 DIAGNOSIS — R339 Retention of urine, unspecified: Secondary | ICD-10-CM | POA: Diagnosis present

## 2019-02-25 DIAGNOSIS — Z87891 Personal history of nicotine dependence: Secondary | ICD-10-CM | POA: Insufficient documentation

## 2019-02-25 DIAGNOSIS — Z8551 Personal history of malignant neoplasm of bladder: Secondary | ICD-10-CM | POA: Insufficient documentation

## 2019-02-25 DIAGNOSIS — Z960 Presence of urogenital implants: Secondary | ICD-10-CM | POA: Insufficient documentation

## 2019-02-25 NOTE — ED Provider Notes (Signed)
[facility] Provider Note   CSN: [id number] Arrival date & time: 02/25/19  2007    History   Chief Complaint Chief Complaint  Patient presents with  . Urinary Retention    HPI Jesse [name] is a 85 y.o. male.     Patient stated that he has had urinary retention he told me he for the past hour or so.  But triage did put down 1 day.  Patient has a catheter in place.  He is scheduled for a TURP by Dr. Alyson Ingles [facility] on Monday.  He has a coud catheter in place.  Patient states that he has had decreased urine output and he has swelling and tenderness in the suprapubic area of his abdomen.  No other specific complaints.  States until this happened everything was fine.     Past Medical History:  Diagnosis Date  . Anxiety   . Asthma    as child  . Bladder cancer [facility]) urologist-  dr Elliot Gault [facility] in [place], [place])   dx 01/ 2016--- post TURBT and post Bladder bx 02-10-2017  . Bladder tumor   . BPH (benign prostatic hyperplasia)   . Frequency of urination   . History of asthma    childhood  . History of external beam radiation therapy    2002  prostate/ pelvis and boost w/ radioactive prostate seed implants   . History of hypercalcemia    s/p  parathyroidectomy 2003  . History of prostate cancer current urologist-  dr Elliot Gault [facility] in [place], [place])-- per lov note (care everywhere) last PSA undetectable   dx 2002--  urologist-- dr dahlstedt/ oncologist dr Valere Dross---  Gleason 7 out of 10, PSA 4.9---post Radioactive Prostate Seed Implant and External Beam Radiation therapy  . History of skin cancer   . Hyperlipidemia   . Multiple lung nodules    since 2006--- followed by pcp --- dr Maudie Mercury  . Renal cyst, left   . Wears glasses   . Wears hearing aid in both ears     There are no active problems to display for this patient.   Past Surgical History:  Procedure Laterality Date  . CARDIOVASCULAR  STRESS TEST  01/01/2004   normal nuclear perfustion study w/ no ischemia/  normal LV function and wall motion, ef 65%  . CATARACT EXTRACTION W/ INTRAOCULAR LENS  IMPLANT, BILATERAL  2010  . COLONOSCOPY    . CYSTO/  BILATERAL RETROGRADE PYELOGRAM/  BLADDER BX'S AND FULGERATION  02-10-2017   dr Anabel Bene [facility] in [place], [place]  . CYSTOSCOPY W/ RETROGRADES Bilateral 07/27/2017   Procedure: CYSTOSCOPY,SELECTIVE CYTOLOGIES,RETROGRADE PYELOGRAM;  Surgeon: Cleon Gustin, MD;  Location: [facility];  Service: Urology;  Laterality: Bilateral;  . CYSTOSCOPY W/ RETROGRADES Bilateral 12/08/2017   Procedure: CYSTOSCOPY WITH RETROGRADE PYELOGRAM;  Surgeon: Cleon Gustin, MD;  Location: [facility];  Service: Urology;  Laterality: Bilateral;  . CYSTOSCOPY WITH BIOPSY N/A 07/27/2017   Procedure: CYSTOSCOPY WITH BIOPSY OF BLADDER AND PROSTATIC URETHRA;  Surgeon: Cleon Gustin, MD;  Location: [facility];  Service: Urology;  Laterality: N/A;  . CYSTOSCOPY WITH BIOPSY Bilateral 12/08/2017   Procedure: CYSTOSCOPY WITH RENAL WASHINGS;  Surgeon: Cleon Gustin, MD;  Location: [facility];  Service: Urology;  Laterality: Bilateral;  . FIBEROPTIC BRONCHOSCOPY  08-20-2005   dr wert   w/ Left lower lobe bx  . PARATHYROIDECTOMY  2003  . RADIOACTIVE PROSTATE SEED  IMPLANTS  04-05-2001    dr Diona Fanti [facility]  . TONSILLECTOMY  child  . TRANSURETHRAL RESECTION OF BLADDER TUMOR  11-24-2014   dr Anabel Bene [facility] in [place], [place]  . TRANSURETHRAL RESECTION OF BLADDER TUMOR N/A 04/08/2018   Procedure: TRANSURETHRAL RESECTION OF BLADDER TUMOR (TURBT);  Surgeon: Cleon Gustin, MD;  Location: [facility];  Service: Urology;  Laterality: N/A;  . UPPER GI ENDOSCOPY          Home Medications    Prior to Admission medications   Medication Sig Start Date End Date Taking? Authorizing Provider   acetaminophen (TYLENOL) 325 MG tablet Take 325 mg by mouth every 6 (six) hours as needed (for pain).    [provider]  chlordiazePOXIDE (LIBRIUM) 10 MG capsule Take 10 mg by mouth daily. In the morning    [provider]  Cholecalciferol (VITAMIN D3) 1000 units CAPS Take 1,000 Units by mouth daily.    [provider]  Multiple Vitamins-Minerals (PRESERVISION AREDS 2) CAPS Take 1 capsule by mouth 2 (two) times daily.    [provider]  nystatin cream (MYCOSTATIN) Apply 1 application topically 2 (two) times daily as needed for dry skin (itching.).    [provider]  pravastatin (PRAVACHOL) 40 MG tablet Take 40 mg by mouth daily. In the morning. 09/30/14   [provider]  solifenacin (VESICARE) 10 MG tablet Take 10 mg by mouth daily. In the morning.    [provider]  tamsulosin (FLOMAX) 0.4 MG CAPS capsule Take 0.4 mg by mouth 2 (two) times a day.     [provider]  thiamine 100 MG tablet Take 100 mg by mouth daily. In the morning. Vitamin B-1    [provider]    Family History History reviewed. No pertinent family history.  Social History Social History   Tobacco Use  . Smoking status: Former Smoker    Years: 50.00    Types: Cigarettes    Last attempt to quit: 11/03/1984    Years since quitting: 34.3  . Smokeless tobacco: Never Used  Substance Use Topics  . Alcohol use: Yes    Alcohol/week: 7.0 standard drinks    Types: 7 Glasses of wine per week    Comment: daily wine  . Drug use: No     Allergies   Adhesive [tape]   Review of Systems Review of Systems  Constitutional: Negative for chills and fever.  HENT: Negative for rhinorrhea and sore throat.   Eyes: Negative for visual disturbance.  Respiratory: Negative for cough and shortness of breath.   Cardiovascular: Negative for chest pain and leg swelling.  Gastrointestinal: Negative for abdominal pain, diarrhea, nausea and vomiting.   Genitourinary: Positive for difficulty urinating. Negative for dysuria.  Musculoskeletal: Negative for back pain and neck pain.  Skin: Negative for rash.  Neurological: Negative for dizziness, light-headedness and headaches.  Hematological: Does not bruise/bleed easily.  Psychiatric/Behavioral: Negative for confusion.     Physical Exam Updated Vital Signs BP (!) 173/83   Pulse (!) 109   Temp 97.8 F (36.6 C) (Oral)   Resp 18   Ht 1.753 m (5\' 9" )   Wt 63.5 kg   SpO2 99%   BMI 20.67 kg/m   Physical Exam Vitals signs and nursing note reviewed.  Constitutional:      Appearance: Normal appearance. He is well-developed.  HENT:     Head: Normocephalic and atraumatic.  Eyes:     Extraocular  Movements: Extraocular movements intact.     Conjunctiva/sclera: Conjunctivae normal.     Pupils: Pupils are equal, round, and reactive to light.  Neck:     Musculoskeletal: Neck supple.  Cardiovascular:     Rate and Rhythm: Normal rate and regular rhythm.     Heart sounds: No murmur.  Pulmonary:     Effort: Pulmonary effort is normal. No respiratory distress.     Breath sounds: Normal breath sounds.  Abdominal:     Palpations: Abdomen is soft.     Tenderness: There is abdominal tenderness.     Comments: Tenderness to palpation over the bladder some fullness to the bladder is palpable.  Genitourinary:    Comments: Foley catheter in penis no scrotal swelling.  No blood around the meatus.  No urine leaking. Musculoskeletal:        General: No swelling.  Skin:    General: Skin is warm and dry.  Neurological:     Mental Status: He is alert and oriented to person, place, and time.      ED Treatments / Results  Labs (all labs ordered are listed, but only abnormal results are displayed) Labs Reviewed - No data to display  EKG None  Radiology No results found.  Procedures Procedures (including critical care time)  Medications Ordered in ED Medications - No data to display    Initial Impression / Assessment and Plan / ED Course  I have reviewed the triage vital signs and the nursing notes.  Pertinent labs & imaging results that were available during my care of the patient were reviewed by me and considered in my medical decision making (see chart for details).        Patient has a longstanding indwelling catheter.  Scheduled for a TURP [facility] Dr. Alyson Ingles on Monday.  Patient had distended bladder and discomfort and said for the last couple hours no urine came out.  We irrigated it put on a larger bag gave him some stuff to drink and we got good urine flow.  Urine is clear.  Patient feels better.  When we irrigated we did get some clots out.  No gross hematuria currently in the bag.  Patient will be discharged home will follow-up with urology as scheduled for his TURP on Monday.  He will return for any recurrent urinary retention.  Final Clinical Impressions(s) / ED Diagnoses   Final diagnoses:  Urinary retention    ED Discharge Orders    None       Fredia Sorrow, MD 02/25/19 2225

## 2019-02-25 NOTE — ED Notes (Signed)
Attempted to flush the Pt. F/C as ordered.  Pt. Tolerated well.  Pt. Has multiple clots to pass.  Pt. Still has slow urine passage with non distended bladder.    Updated EDP with results after attempt to flush F/C

## 2019-02-25 NOTE — ED Notes (Signed)
Placed new drainage bag for F/C for Pt. Due to leg bag not big enough.  Showed Pt. How to use the new bag and empty the urine.

## 2019-02-25 NOTE — Discharge Instructions (Addendum)
Return if you have recurrent urinary retention or any abdominal discomfort.  Keep your appointment for your TURP on Monday with Dr. Alyson Ingles.

## 2019-02-25 NOTE — Progress Notes (Signed)
SPOKE W/  _patient via phone     SCREENING SYMPTOMS OF COVID 19:   COUGH--No  RUNNY NOSE--- No  SORE THROAT---No  NASAL CONGESTION----No  SNEEZING----No  SHORTNESS OF BREATH---No  DIFFICULTY BREATHING---No  TEMP >100.4-----No  UNEXPLAINED BODY ACHES------No   HAVE YOU OR ANY FAMILY MEMBER TRAVELLED PAST 14 DAYS OUT OF THE   COUNTY---No STATE----No COUNTRY----No  HAVE YOU OR ANY FAMILY MEMBER BEEN EXPOSED TO ANYONE WITH COVID 19?No    Reviewed with patient Visitor Restrictions due to Covid 19 with patient verbalizing understanding.

## 2019-02-25 NOTE — Progress Notes (Signed)
Anesthesia Chart Review   Case:  [id number] Date/Time:  02/28/19 1230   Procedure:  TRANSURETHRAL RESECTION OF THE PROSTATE (TURP) (N/A ) - 30 MINS   Anesthesia type:  General   Pre-op diagnosis:  PROSTATIC URETHRA TUMOR   Location:  [facility] ROOM 03 / [facility]   Surgeon:  Cleon Gustin, MD      DISCUSSION:85 yo former smoker (quit 11/03/84) with h/o anxiety, HLD, bladder cancer, prostate cancer (s/p radioactive seed and radiation therapy), prostatic urethra tumor scheduled for above procedure 02/28/19 with Dr. Nicolette Bang.   S/p TURBT 04/08/18.  Anesthesia records reviewed with no anesthesia complications noted.   Pt can proceed with planned procedure barring acute status change and after evaluation DOS (SDW).  VS: There were no vitals taken for this visit.  PROVIDERS: Jani Gravel, MD is PCP    LABS: Labs DOS, SDW (all labs ordered are listed, but only abnormal results are displayed)  Labs Reviewed - No data to display   IMAGES:   EKG:   CV:  Past Medical History:  Diagnosis Date  . Anxiety   . Bladder cancer [facility]) urologist-  dr Elliot Gault [facility] in [place], [place])   dx 01/ 2016--- post TURBT and post Bladder bx 02-10-2017  . Bladder tumor   . BPH (benign prostatic hyperplasia)   . Frequency of urination   . History of asthma    childhood  . History of external beam radiation therapy    2002  prostate/ pelvis and boost w/ radioactive prostate seed implants   . History of hypercalcemia    s/p  parathyroidectomy 2003  . History of prostate cancer current urologist-  dr Elliot Gault [facility] in [place], [place])-- per lov note (care everywhere) last PSA undetectable   dx 2002--  urologist-- dr dahlstedt/ oncologist dr Valere Dross---  Gleason 7 out of 10, PSA 4.9---post Radioactive Prostate Seed Implant and External Beam Radiation therapy  . History of skin cancer   . Hyperlipidemia   . Multiple lung nodules    since 2006--- followed by pcp --- dr Maudie Mercury   . Renal cyst, left   . Wears glasses   . Wears hearing aid in both ears     Past Surgical History:  Procedure Laterality Date  . CARDIOVASCULAR STRESS TEST  01/01/2004   normal nuclear perfustion study w/ no ischemia/  normal LV function and wall motion, ef 65%  . CATARACT EXTRACTION W/ INTRAOCULAR LENS  IMPLANT, BILATERAL  2010  . COLONOSCOPY    . CYSTO/  BILATERAL RETROGRADE PYELOGRAM/  BLADDER BX'S AND FULGERATION  02-10-2017   dr Anabel Bene [facility] in [place], [place]  . CYSTOSCOPY W/ RETROGRADES Bilateral 07/27/2017   Procedure: CYSTOSCOPY,SELECTIVE CYTOLOGIES,RETROGRADE PYELOGRAM;  Surgeon: Cleon Gustin, MD;  Location: [facility];  Service: Urology;  Laterality: Bilateral;  . CYSTOSCOPY W/ RETROGRADES Bilateral 12/08/2017   Procedure: CYSTOSCOPY WITH RETROGRADE PYELOGRAM;  Surgeon: Cleon Gustin, MD;  Location: [facility];  Service: Urology;  Laterality: Bilateral;  . CYSTOSCOPY WITH BIOPSY N/A 07/27/2017   Procedure: CYSTOSCOPY WITH BIOPSY OF BLADDER AND PROSTATIC URETHRA;  Surgeon: Cleon Gustin, MD;  Location: [facility];  Service: Urology;  Laterality: N/A;  . CYSTOSCOPY WITH BIOPSY Bilateral 12/08/2017   Procedure: CYSTOSCOPY WITH RENAL WASHINGS;  Surgeon: Cleon Gustin, MD;  Location: [facility];  Service: Urology;  Laterality: Bilateral;  . FIBEROPTIC BRONCHOSCOPY  08-20-2005   dr wert   w/ Left lower  lobe bx  . PARATHYROIDECTOMY  2003  . RADIOACTIVE PROSTATE SEED IMPLANTS  04-05-2001    dr Diona Fanti [facility]  . TONSILLECTOMY  child  . TRANSURETHRAL RESECTION OF BLADDER TUMOR  11-24-2014   dr Anabel Bene [facility] in [place], [place]  . TRANSURETHRAL RESECTION OF BLADDER TUMOR N/A 04/08/2018   Procedure: TRANSURETHRAL RESECTION OF BLADDER TUMOR (TURBT);  Surgeon: Cleon Gustin, MD;  Location: [facility];  Service: Urology;  Laterality: N/A;  . UPPER GI  ENDOSCOPY      MEDICATIONS: No current facility-administered medications for this encounter.    Marland Kitchen acetaminophen (TYLENOL) 325 MG tablet  . chlordiazePOXIDE (LIBRIUM) 10 MG capsule  . Cholecalciferol (VITAMIN D3) 1000 units CAPS  . Multiple Vitamins-Minerals (PRESERVISION AREDS 2) CAPS  . nystatin cream (MYCOSTATIN)  . pravastatin (PRAVACHOL) 40 MG tablet  . solifenacin (VESICARE) 10 MG tablet  . tamsulosin (FLOMAX) 0.4 MG CAPS capsule  . thiamine 100 MG tablet    Maia Plan [facility] Pre-Surgical Testing [phone] 02/25/19 10:30 AM

## 2019-02-25 NOTE — ED Triage Notes (Signed)
Pt c/o urinary retention x 1 day , pt has catheter

## 2019-02-26 ENCOUNTER — Other Ambulatory Visit: Payer: Self-pay

## 2019-02-26 ENCOUNTER — Emergency Department (HOSPITAL_BASED_OUTPATIENT_CLINIC_OR_DEPARTMENT_OTHER)
Admission: EM | Admit: 2019-02-26 | Discharge: 2019-02-26 | Disposition: A | Discharge status: Home / Self Care | Payer: Medicare Other | Attending: Emergency Medicine | Admitting: Emergency Medicine

## 2019-02-26 ENCOUNTER — Encounter (HOSPITAL_BASED_OUTPATIENT_CLINIC_OR_DEPARTMENT_OTHER): Payer: Self-pay | Admitting: *Deleted

## 2019-02-26 DIAGNOSIS — R339 Retention of urine, unspecified: Secondary | ICD-10-CM | POA: Insufficient documentation

## 2019-02-26 DIAGNOSIS — Z79899 Other long term (current) drug therapy: Secondary | ICD-10-CM | POA: Insufficient documentation

## 2019-02-26 DIAGNOSIS — R338 Other retention of urine: Secondary | ICD-10-CM

## 2019-02-26 DIAGNOSIS — Z87891 Personal history of nicotine dependence: Secondary | ICD-10-CM | POA: Diagnosis not present

## 2019-02-26 DIAGNOSIS — R319 Hematuria, unspecified: Secondary | ICD-10-CM | POA: Diagnosis present

## 2019-02-26 LAB — URINALYSIS, ROUTINE W REFLEX MICROSCOPIC

## 2019-02-26 LAB — URINALYSIS, MICROSCOPIC (REFLEX): RBC / HPF: >50 RBC/hpf (ref 0–5)

## 2019-02-26 MED ORDER — LIDOCAINE HCL URETHRAL/MUCOSAL 2 % EX GEL
1.0000 "application " | Freq: Once | CUTANEOUS | Status: AC
  Administered 2019-02-26: 1 via URETHRAL
  Filled 2019-02-26: qty 20

## 2019-02-26 MED ORDER — ACETAMINOPHEN 325 MG PO TABS
650.0000 mg | ORAL_TABLET | Freq: Once | ORAL | Status: AC
  Administered 2019-02-26: 650 mg via ORAL
  Filled 2019-02-26: qty 2

## 2019-02-26 NOTE — ED Notes (Signed)
Checked with lab regarding delay of urinalysis, this RN informed by lab tech that results having to be put in manually. EDP [place] informed

## 2019-02-26 NOTE — ED Provider Notes (Signed)
[facility] EMERGENCY DEPARTMENT Provider Note   CSN: [id number] Arrival date & time: 02/26/19  1902    History   Chief Complaint Chief Complaint  Patient presents with  . Hematuria    HPI Jesse [name] is a 85 y.o. male.     HPI  85 year old male presents with obstructed Foley catheter.  He was seen here yesterday for similar and had this Foley catheter irrigated and a new bag applied.  This Foley catheter has been present for about a week.  He is due for a TURP in 2 days.  No fevers, vomiting, or flank pain.  Mild lower abdominal discomfort.  When he woke up this morning he had a little bit of blood but no obstructive symptoms.  About 45 minutes ago he noticed the catheter was no longer flowing and the bag had already had blood.  He also noticed a couple blood clots in his underwear coming around the catheter.  Past Medical History:  Diagnosis Date  . Anxiety   . Asthma    as child  . Bladder cancer [facility]) urologist-  dr Elliot Gault [facility] in [place], [place])   dx 01/ 2016--- post TURBT and post Bladder bx 02-10-2017  . Bladder tumor   . BPH (benign prostatic hyperplasia)   . Frequency of urination   . History of asthma    childhood  . History of external beam radiation therapy    2002  prostate/ pelvis and boost w/ radioactive prostate seed implants   . History of hypercalcemia    s/p  parathyroidectomy 2003  . History of prostate cancer current urologist-  dr Elliot Gault [facility] in [place], [place])-- per lov note (care everywhere) last PSA undetectable   dx 2002--  urologist-- dr dahlstedt/ oncologist dr Valere Dross---  Gleason 7 out of 10, PSA 4.9---post Radioactive Prostate Seed Implant and External Beam Radiation therapy  . History of skin cancer   . Hyperlipidemia   . Multiple lung nodules    since 2006--- followed by pcp --- dr Maudie Mercury  . Renal cyst, left   . Wears glasses   . Wears hearing aid in both ears     There are no  active problems to display for this patient.   Past Surgical History:  Procedure Laterality Date  . CARDIOVASCULAR STRESS TEST  01/01/2004   normal nuclear perfustion study w/ no ischemia/  normal LV function and wall motion, ef 65%  . CATARACT EXTRACTION W/ INTRAOCULAR LENS  IMPLANT, BILATERAL  2010  . COLONOSCOPY    . CYSTO/  BILATERAL RETROGRADE PYELOGRAM/  BLADDER BX'S AND FULGERATION  02-10-2017   dr Anabel Bene [facility] in [place], [place]  . CYSTOSCOPY W/ RETROGRADES Bilateral 07/27/2017   Procedure: CYSTOSCOPY,SELECTIVE CYTOLOGIES,RETROGRADE PYELOGRAM;  Surgeon: Cleon Gustin, MD;  Location: [facility];  Service: Urology;  Laterality: Bilateral;  . CYSTOSCOPY W/ RETROGRADES Bilateral 12/08/2017   Procedure: CYSTOSCOPY WITH RETROGRADE PYELOGRAM;  Surgeon: Cleon Gustin, MD;  Location: [facility];  Service: Urology;  Laterality: Bilateral;  . CYSTOSCOPY WITH BIOPSY N/A 07/27/2017   Procedure: CYSTOSCOPY WITH BIOPSY OF BLADDER AND PROSTATIC URETHRA;  Surgeon: Cleon Gustin, MD;  Location: [facility];  Service: Urology;  Laterality: N/A;  . CYSTOSCOPY WITH BIOPSY Bilateral 12/08/2017   Procedure: CYSTOSCOPY WITH RENAL WASHINGS;  Surgeon: Cleon Gustin, MD;  Location: [facility];  Service: Urology;  Laterality: Bilateral;  . FIBEROPTIC BRONCHOSCOPY  08-20-2005  dr wert   w/ Left lower lobe bx  . PARATHYROIDECTOMY  2003  . RADIOACTIVE PROSTATE SEED IMPLANTS  04-05-2001    dr Diona Fanti [facility]  . TONSILLECTOMY  child  . TRANSURETHRAL RESECTION OF BLADDER TUMOR  11-24-2014   dr Anabel Bene [facility] in [place], [place]  . TRANSURETHRAL RESECTION OF BLADDER TUMOR N/A 04/08/2018   Procedure: TRANSURETHRAL RESECTION OF BLADDER TUMOR (TURBT);  Surgeon: Cleon Gustin, MD;  Location: [facility];  Service: Urology;  Laterality: N/A;  . UPPER GI ENDOSCOPY          Home  Medications    Prior to Admission medications   Medication Sig Start Date End Date Taking? Authorizing Provider  acetaminophen (TYLENOL) 325 MG tablet Take 325 mg by mouth every 6 (six) hours as needed (for pain).    [provider]  chlordiazePOXIDE (LIBRIUM) 10 MG capsule Take 10 mg by mouth daily. In the morning    [provider]  Cholecalciferol (VITAMIN D3) 1000 units CAPS Take 1,000 Units by mouth daily.    [provider]  Multiple Vitamins-Minerals (PRESERVISION AREDS 2) CAPS Take 1 capsule by mouth 2 (two) times daily.    [provider]  nystatin cream (MYCOSTATIN) Apply 1 application topically 2 (two) times daily as needed for dry skin (itching.).    [provider]  pravastatin (PRAVACHOL) 40 MG tablet Take 40 mg by mouth daily. In the morning. 09/30/14   [provider]  solifenacin (VESICARE) 10 MG tablet Take 10 mg by mouth daily. In the morning.    [provider]  tamsulosin (FLOMAX) 0.4 MG CAPS capsule Take 0.4 mg by mouth 2 (two) times a day.     [provider]  thiamine 100 MG tablet Take 100 mg by mouth daily. In the morning. Vitamin B-1    [provider]    Family History No family history on file.  Social History Social History   Tobacco Use  . Smoking status: Former Smoker    Years: 50.00    Types: Cigarettes    Last attempt to quit: 11/03/1984    Years since quitting: 34.3  . Smokeless tobacco: Never Used  Substance Use Topics  . Alcohol use: Yes    Alcohol/week: 7.0 standard drinks    Types: 7 Glasses of wine per week    Comment: daily wine  . Drug use: No     Allergies   Adhesive [tape]   Review of Systems Review of Systems  Constitutional: Negative for fever.  Gastrointestinal: Positive for abdominal pain (mild). Negative for vomiting.  Genitourinary: Positive for difficulty urinating and hematuria. Negative for dysuria, flank pain and penile pain.   Musculoskeletal: Negative for back pain.  All other systems reviewed and are negative.    Physical Exam Updated Vital Signs BP (!) 152/84 (BP Location: Right Arm)   Pulse (!) 110   Temp 97.9 F (36.6 C) (Oral)   Resp 20   Ht 5\' 9"  (1.753 m)   Wt 63.5 kg   SpO2 100%   BMI 20.67 kg/m   Physical Exam Vitals signs and nursing note reviewed.  Constitutional:      General: He is not in acute distress.    Appearance: He is well-developed. He is not ill-appearing or diaphoretic.  HENT:     Head: Normocephalic and atraumatic.     Right Ear: External ear normal.     Left Ear: External ear normal.  Nose: Nose normal.  Eyes:     General:        Right eye: No discharge.        Left eye: No discharge.  Neck:     Musculoskeletal: Neck supple.  Pulmonary:     Effort: Pulmonary effort is normal.  Abdominal:     General: There is no distension.     Palpations: Abdomen is soft.     Tenderness: There is no abdominal tenderness.  Genitourinary:    Penis: Circumcised.      Comments: 22 Fr foley catheter in place. No urine output. No penile lesions, swelling or tenderness. No current bleeding. Dark red urine in foley catheter bag Skin:    General: Skin is warm and dry.  Neurological:     Mental Status: He is alert.  Psychiatric:        Mood and Affect: Mood is not anxious.      ED Treatments / Results  Labs (all labs ordered are listed, but only abnormal results are displayed) Labs Reviewed  URINALYSIS, ROUTINE W REFLEX MICROSCOPIC - Abnormal; Notable for the following components:      Result Value   Color, Urine RED (*)    APPearance TURBID (*)    Glucose, UA   (*)    Value: TEST NOT REPORTED DUE TO COLOR INTERFERENCE OF URINE PIGMENT   Hgb urine dipstick   (*)    Value: TEST NOT REPORTED DUE TO COLOR INTERFERENCE OF URINE PIGMENT   Bilirubin Urine   (*)    Value: TEST NOT REPORTED DUE TO COLOR INTERFERENCE OF URINE PIGMENT   Ketones, ur   (*)    Value: TEST NOT  REPORTED DUE TO COLOR INTERFERENCE OF URINE PIGMENT   Protein, ur   (*)    Value: TEST NOT REPORTED DUE TO COLOR INTERFERENCE OF URINE PIGMENT   Nitrite   (*)    Value: TEST NOT REPORTED DUE TO COLOR INTERFERENCE OF URINE PIGMENT   Leukocytes,Ua   (*)    Value: TEST NOT REPORTED DUE TO COLOR INTERFERENCE OF URINE PIGMENT   All other components within normal limits  URINALYSIS, MICROSCOPIC (REFLEX) - Abnormal; Notable for the following components:   Bacteria, UA MANY (*)    All other components within normal limits  URINE CULTURE    EKG None  Radiology No results found.  Procedures Procedures (including critical care time)  Medications Ordered in ED Medications  lidocaine (XYLOCAINE) 2 % jelly 1 application (1 application Urethral Given 02/26/19 1954)  acetaminophen (TYLENOL) tablet 650 mg (650 mg Oral Given 02/26/19 2003)     Initial Impression / Assessment and Plan / ED Course  I have reviewed the triage vital signs and the nursing notes.  Pertinent labs & imaging results that were available during my care of the patient were reviewed by me and considered in my medical decision making (see chart for details).        Foley catheter was removed and a 24 [place] was placed.  Now having good urine output and relief of symptoms.  It is grossly bloody.  There is 6-10 WBCs and many bacteria but given no urinary tract infection symptoms this probably is not a UTI.  I think sending for culture is reasonable, especially since he is having surgery upcoming.  Otherwise, follow-up with his surgery in 2 days.  Final Clinical Impressions(s) / ED Diagnoses   Final diagnoses:  Acute urinary retention    ED Discharge Orders  None       Sherwood Gambler, MD 02/26/19 2147

## 2019-02-26 NOTE — ED Notes (Signed)
ED Provider at bedside. 

## 2019-02-26 NOTE — ED Triage Notes (Addendum)
Pt seen here last night. Has a catheter in place. Bloody urine noted in bag. States he has blood clots blocking flow and leaking around catheter

## 2019-02-27 ENCOUNTER — Emergency Department (HOSPITAL_BASED_OUTPATIENT_CLINIC_OR_DEPARTMENT_OTHER)
Admission: EM | Admit: 2019-02-27 | Discharge: 2019-02-27 | Disposition: A | Discharge status: Home / Self Care | Payer: Medicare Other | Attending: Emergency Medicine | Admitting: Emergency Medicine

## 2019-02-27 ENCOUNTER — Encounter (HOSPITAL_BASED_OUTPATIENT_CLINIC_OR_DEPARTMENT_OTHER): Payer: Self-pay | Admitting: Emergency Medicine

## 2019-02-27 DIAGNOSIS — Z8546 Personal history of malignant neoplasm of prostate: Secondary | ICD-10-CM | POA: Insufficient documentation

## 2019-02-27 DIAGNOSIS — Z8551 Personal history of malignant neoplasm of bladder: Secondary | ICD-10-CM | POA: Insufficient documentation

## 2019-02-27 DIAGNOSIS — J45909 Unspecified asthma, uncomplicated: Secondary | ICD-10-CM | POA: Diagnosis not present

## 2019-02-27 DIAGNOSIS — Y828 Other medical devices associated with adverse incidents: Secondary | ICD-10-CM | POA: Insufficient documentation

## 2019-02-27 DIAGNOSIS — Z79899 Other long term (current) drug therapy: Secondary | ICD-10-CM | POA: Diagnosis not present

## 2019-02-27 DIAGNOSIS — T83091A Other mechanical complication of indwelling urethral catheter, initial encounter: Secondary | ICD-10-CM | POA: Insufficient documentation

## 2019-02-27 DIAGNOSIS — R339 Retention of urine, unspecified: Secondary | ICD-10-CM | POA: Diagnosis present

## 2019-02-27 NOTE — ED Notes (Signed)
Pt understood dc material. NAD noted. All questions answered to satisfaction. Pt escorted to checkout counter

## 2019-02-27 NOTE — ED Triage Notes (Signed)
Pt was discharged home with catheter in place. States it stopped draining and is having lower abdominal pain.

## 2019-02-27 NOTE — ED Notes (Signed)
Irrigated patients in place foley per MD order with 0.9% NS. Removed many large blood clots.

## 2019-02-27 NOTE — ED Notes (Signed)
EMT walked pt to discharge.

## 2019-02-27 NOTE — ED Provider Notes (Signed)
[facility] DEPT [facility] Provider Note: Georgena Spurling, MD, FACEP  CSN: [id number] MRN: [id number] ARRIVAL: 02/27/19 at [place]: Pray  Urinary Retention   HISTORY OF PRESENT ILLNESS  02/27/19 1:26 AM Jesse [name] is a 85 y.o. male who is scheduled for a transurethral resection of the prostate tomorrow.  He is here for his third visit in 3 days for urinary retention.  On his most recent visit he was discharged with a 33 French Foley catheter.  He states he has not had any significant urine output and what urine has drained has been grossly bloody.  He denies abdominal pain or bladder distention but complains of intermittent spasms in his penis which he rates as a 10 out of 10.  He denies pain at the present moment.  Bedside bladder scan showed no urine in the bladder.  Past Medical History:  Diagnosis Date  . Anxiety   . Asthma    as child  . Bladder cancer [facility]) urologist-  dr Elliot Gault [facility] in [place], [place])   dx 01/ 2016--- post TURBT and post Bladder bx 02-10-2017  . Bladder tumor   . BPH (benign prostatic hyperplasia)   . Frequency of urination   . History of asthma    childhood  . History of external beam radiation therapy    2002  prostate/ pelvis and boost w/ radioactive prostate seed implants   . History of hypercalcemia    s/p  parathyroidectomy 2003  . History of prostate cancer current urologist-  dr Elliot Gault [facility] in [place], [place])-- per lov note (care everywhere) last PSA undetectable   dx 2002--  urologist-- dr dahlstedt/ oncologist dr Valere Dross---  Gleason 7 out of 10, PSA 4.9---post Radioactive Prostate Seed Implant and External Beam Radiation therapy  . History of skin cancer   . Hyperlipidemia   . Multiple lung nodules    since 2006--- followed by pcp --- dr Maudie Mercury  . Renal cyst, left   . Wears glasses   . Wears hearing aid in both ears     Past Surgical History:  Procedure Laterality Date  .  CARDIOVASCULAR STRESS TEST  01/01/2004   normal nuclear perfustion study w/ no ischemia/  normal LV function and wall motion, ef 65%  . CATARACT EXTRACTION W/ INTRAOCULAR LENS  IMPLANT, BILATERAL  2010  . COLONOSCOPY    . CYSTO/  BILATERAL RETROGRADE PYELOGRAM/  BLADDER BX'S AND FULGERATION  02-10-2017   dr Anabel Bene [facility] in [place], [place]  . CYSTOSCOPY W/ RETROGRADES Bilateral 07/27/2017   Procedure: CYSTOSCOPY,SELECTIVE CYTOLOGIES,RETROGRADE PYELOGRAM;  Surgeon: Cleon Gustin, MD;  Location: [facility];  Service: Urology;  Laterality: Bilateral;  . CYSTOSCOPY W/ RETROGRADES Bilateral 12/08/2017   Procedure: CYSTOSCOPY WITH RETROGRADE PYELOGRAM;  Surgeon: Cleon Gustin, MD;  Location: [facility];  Service: Urology;  Laterality: Bilateral;  . CYSTOSCOPY WITH BIOPSY N/A 07/27/2017   Procedure: CYSTOSCOPY WITH BIOPSY OF BLADDER AND PROSTATIC URETHRA;  Surgeon: Cleon Gustin, MD;  Location: [facility];  Service: Urology;  Laterality: N/A;  . CYSTOSCOPY WITH BIOPSY Bilateral 12/08/2017   Procedure: CYSTOSCOPY WITH RENAL WASHINGS;  Surgeon: Cleon Gustin, MD;  Location: [facility];  Service: Urology;  Laterality: Bilateral;  . FIBEROPTIC BRONCHOSCOPY  08-20-2005   dr wert   w/ Left lower lobe bx  . PARATHYROIDECTOMY  2003  . RADIOACTIVE PROSTATE SEED IMPLANTS  04-05-2001    dr Diona Fanti  [place]  . TONSILLECTOMY  child  . TRANSURETHRAL RESECTION OF BLADDER TUMOR  11-24-2014   dr Anabel Bene [facility] in [place], [place]  . TRANSURETHRAL RESECTION OF BLADDER TUMOR N/A 04/08/2018   Procedure: TRANSURETHRAL RESECTION OF BLADDER TUMOR (TURBT);  Surgeon: Cleon Gustin, MD;  Location: [facility];  Service: Urology;  Laterality: N/A;  . UPPER GI ENDOSCOPY      No family history on file.  Social History   Tobacco Use  . Smoking status: Former Smoker    Years: 50.00    Types:  Cigarettes    Last attempt to quit: 11/03/1984    Years since quitting: 34.3  . Smokeless tobacco: Never Used  Substance Use Topics  . Alcohol use: Yes    Alcohol/week: 7.0 standard drinks    Types: 7 Glasses of wine per week    Comment: daily wine  . Drug use: No    Prior to Admission medications   Medication Sig Start Date End Date Taking? Authorizing Provider  acetaminophen (TYLENOL) 325 MG tablet Take 325 mg by mouth every 6 (six) hours as needed (for pain).    [provider]  chlordiazePOXIDE (LIBRIUM) 10 MG capsule Take 10 mg by mouth daily. In the morning    [provider]  Cholecalciferol (VITAMIN D3) 1000 units CAPS Take 1,000 Units by mouth daily.    [provider]  Multiple Vitamins-Minerals (PRESERVISION AREDS 2) CAPS Take 1 capsule by mouth 2 (two) times daily.    [provider]  nystatin cream (MYCOSTATIN) Apply 1 application topically 2 (two) times daily as needed for dry skin (itching.).    [provider]  pravastatin (PRAVACHOL) 40 MG tablet Take 40 mg by mouth daily. In the morning. 09/30/14   [provider]  solifenacin (VESICARE) 10 MG tablet Take 10 mg by mouth daily. In the morning.    [provider]  tamsulosin (FLOMAX) 0.4 MG CAPS capsule Take 0.4 mg by mouth 2 (two) times a day.     [provider]  thiamine 100 MG tablet Take 100 mg by mouth daily. In the morning. Vitamin B-1    [provider]    Allergies Adhesive [tape]   REVIEW OF SYSTEMS  Negative except as noted here or in the History of Present Illness.   PHYSICAL EXAMINATION  Initial Vital Signs Blood pressure (!) 155/95, pulse 95, temperature (!) 97.4 F (36.3 C), resp. rate 20, height 5\' 9"  (1.753 m), weight 63.5 kg, SpO2 97 %.  Examination General: Well-developed, well-nourished male in no acute distress; appearance consistent with age of record HENT: normocephalic; atraumatic Eyes: pupils equal, round  and reactive to light; extraocular muscles intact; arcus senilis bilaterally; bilateral pseudophakia Neck: supple Heart: regular rate and rhythm Lungs: clear to auscultation bilaterally Abdomen: soft; nondistended; nontender; no masses or hepatosplenomegaly; bowel sounds present GU: Tanner V male, circumcised; 58 French Foley catheter in place draining minimal bloody urine Extremities: No deformity; full range of motion; pulses normal Neurologic: Awake, alert and oriented; motor function intact in all extremities and symmetric; no facial droop Skin: Warm and dry Psychiatric: Normal mood and affect   RESULTS  Summary of this visit's results, reviewed by myself:   EKG Interpretation  Date/Time:    Ventricular Rate:    PR Interval:    QRS Duration:   QT Interval:    QTC Calculation:   R Axis:     Text Interpretation:  Laboratory Studies: Results for orders placed or performed during the hospital encounter of 02/26/19 (from the past 24 hour(s))  Urinalysis, Routine w reflex microscopic     Status: Abnormal   Collection Time: 02/26/19  7:53 PM  Result Value Ref Range   Color, Urine RED (A) YELLOW   APPearance TURBID (A) CLEAR   Specific Gravity, Urine  1.005 - 1.030    TEST NOT REPORTED DUE TO COLOR INTERFERENCE OF URINE PIGMENT   pH  5.0 - 8.0    TEST NOT REPORTED DUE TO COLOR INTERFERENCE OF URINE PIGMENT   Glucose, UA (A) NEGATIVE mg/dL    TEST NOT REPORTED DUE TO COLOR INTERFERENCE OF URINE PIGMENT   Hgb urine dipstick (A) NEGATIVE    TEST NOT REPORTED DUE TO COLOR INTERFERENCE OF URINE PIGMENT   Bilirubin Urine (A) NEGATIVE    TEST NOT REPORTED DUE TO COLOR INTERFERENCE OF URINE PIGMENT   Ketones, ur (A) NEGATIVE mg/dL    TEST NOT REPORTED DUE TO COLOR INTERFERENCE OF URINE PIGMENT   Protein, ur (A) NEGATIVE mg/dL    TEST NOT REPORTED DUE TO COLOR INTERFERENCE OF URINE PIGMENT   Nitrite (A) NEGATIVE    TEST NOT REPORTED DUE TO COLOR INTERFERENCE OF URINE  PIGMENT   Leukocytes,Ua (A) NEGATIVE    TEST NOT REPORTED DUE TO COLOR INTERFERENCE OF URINE PIGMENT  Urinalysis, Microscopic (reflex)     Status: Abnormal   Collection Time: 02/26/19  7:53 PM  Result Value Ref Range   RBC / HPF >50 0 - 5 RBC/hpf   WBC, UA 6-10 0 - 5 WBC/hpf   Bacteria, UA MANY (A) NONE SEEN   Squamous Epithelial / LPF 0-5 0 - 5   Amorphous Crystal PRESENT    Imaging Studies: No results found.  ED COURSE and MDM  Nursing notes and initial vitals signs, including pulse oximetry, reviewed.  Vitals:   02/27/19 0121 02/27/19 0123  BP:  (!) 155/95  Pulse:  95  Resp:  20  Temp:  (!) 97.4 F (36.3 C)  SpO2:  97%  Weight: 63.5 kg   Height: 5\' 9"  (1.753 m)    1:59 AM Foley catheter irrigated with significant passage of clots.  Patient feeling better and ready to go home.  PROCEDURES    ED DIAGNOSES     ICD-10-CM   1. Obstruction of Foley catheter, initial encounter ([place]) T83.091A        Starsky Nanna, MD 02/27/19 0200

## 2019-02-28 ENCOUNTER — Encounter (HOSPITAL_COMMUNITY)
Admission: RE | Disposition: A | Discharge status: Home / Self Care | Payer: Self-pay | Source: Home / Self Care | Attending: Urology

## 2019-02-28 ENCOUNTER — Ambulatory Visit (HOSPITAL_COMMUNITY)
Admission: RE | Admit: 2019-02-28 | Discharge: 2019-02-28 | Disposition: A | Discharge status: Home / Self Care | Payer: Medicare Other | Attending: Urology | Admitting: Urology

## 2019-02-28 ENCOUNTER — Ambulatory Visit (HOSPITAL_COMMUNITY): Payer: Medicare Other | Admitting: Physician Assistant

## 2019-02-28 ENCOUNTER — Encounter (HOSPITAL_COMMUNITY): Payer: Self-pay | Admitting: *Deleted

## 2019-02-28 DIAGNOSIS — Z87891 Personal history of nicotine dependence: Secondary | ICD-10-CM | POA: Insufficient documentation

## 2019-02-28 DIAGNOSIS — R310 Gross hematuria: Secondary | ICD-10-CM | POA: Insufficient documentation

## 2019-02-28 DIAGNOSIS — Z85828 Personal history of other malignant neoplasm of skin: Secondary | ICD-10-CM | POA: Diagnosis not present

## 2019-02-28 DIAGNOSIS — Z8551 Personal history of malignant neoplasm of bladder: Secondary | ICD-10-CM | POA: Diagnosis not present

## 2019-02-28 DIAGNOSIS — Z923 Personal history of irradiation: Secondary | ICD-10-CM | POA: Insufficient documentation

## 2019-02-28 DIAGNOSIS — N3289 Other specified disorders of bladder: Secondary | ICD-10-CM | POA: Diagnosis not present

## 2019-02-28 DIAGNOSIS — Z8546 Personal history of malignant neoplasm of prostate: Secondary | ICD-10-CM | POA: Insufficient documentation

## 2019-02-28 HISTORY — PX: TRANSURETHRAL RESECTION OF PROSTATE: SHX73

## 2019-02-28 LAB — URINE CULTURE: Culture: <10000 — AB

## 2019-02-28 LAB — CBC WITH DIFFERENTIAL/PLATELET
Abs Immature Granulocytes: 0.03 10*3/uL (ref 0.00–0.07)
Basophils Absolute: 0 10*3/uL (ref 0.0–0.1)
Basophils Relative: 0 %
Eosinophils Absolute: 0 10*3/uL (ref 0.0–0.5)
Eosinophils Relative: 0 %
HCT: 40.1 % (ref 39.0–52.0)
Hemoglobin: 13.2 g/dL (ref 13.0–17.0)
Immature Granulocytes: 0 %
Lymphocytes Relative: 22 %
Lymphs Abs: 1.9 10*3/uL (ref 0.7–4.0)
MCH: 32.8 pg (ref 26.0–34.0)
MCHC: 32.9 g/dL (ref 30.0–36.0)
MCV: 99.5 fL (ref 80.0–100.0)
Monocytes Absolute: 1 10*3/uL (ref 0.1–1.0)
Monocytes Relative: 12 %
Neutro Abs: 5.6 10*3/uL (ref 1.7–7.7)
Neutrophils Relative %: 66 %
Platelets: 223 10*3/uL (ref 150–400)
RBC: 4.03 MIL/uL — ABNORMAL LOW (ref 4.22–5.81)
RDW: 14.2 % (ref 11.5–15.5)
WBC: 8.5 10*3/uL (ref 4.0–10.5)
nRBC: 0 % (ref 0.0–0.2)

## 2019-02-28 SURGERY — TURP (TRANSURETHRAL RESECTION OF PROSTATE)
Anesthesia: General

## 2019-02-28 MED ORDER — FENTANYL CITRATE (PF) 100 MCG/2ML IJ SOLN
INTRAMUSCULAR | Status: AC
  Filled 2019-02-28: qty 4

## 2019-02-28 MED ORDER — SODIUM CHLORIDE 0.9 % IR SOLN
Status: DC | PRN
  Administered 2019-02-28: 3000 mL via INTRAVESICAL

## 2019-02-28 MED ORDER — DEXAMETHASONE SODIUM PHOSPHATE 10 MG/ML IJ SOLN
INTRAMUSCULAR | Status: AC
  Filled 2019-02-28: qty 1

## 2019-02-28 MED ORDER — PHENYLEPHRINE 40 MCG/ML (10ML) SYRINGE FOR IV PUSH (FOR BLOOD PRESSURE SUPPORT)
PREFILLED_SYRINGE | INTRAVENOUS | Status: DC | PRN
  Administered 2019-02-28: 80 ug via INTRAVENOUS

## 2019-02-28 MED ORDER — LIDOCAINE HCL URETHRAL/MUCOSAL 2 % EX GEL
CUTANEOUS | Status: AC
  Filled 2019-02-28: qty 5

## 2019-02-28 MED ORDER — LIDOCAINE HCL URETHRAL/MUCOSAL 2 % EX GEL
CUTANEOUS | Status: DC | PRN
  Administered 2019-02-28: 1

## 2019-02-28 MED ORDER — LIDOCAINE 2% (20 MG/ML) 5 ML SYRINGE
INTRAMUSCULAR | Status: AC
  Filled 2019-02-28: qty 5

## 2019-02-28 MED ORDER — HYDROCODONE-ACETAMINOPHEN 5-325 MG PO TABS: 1.0000 | ORAL_TABLET | Freq: Four times a day (QID) | ORAL | 0 refills | Status: DC | PRN

## 2019-02-28 MED ORDER — FENTANYL CITRATE (PF) 100 MCG/2ML IJ SOLN
INTRAMUSCULAR | Status: DC | PRN
  Administered 2019-02-28 (×4): 25 ug via INTRAVENOUS

## 2019-02-28 MED ORDER — PROPOFOL 10 MG/ML IV BOLUS
INTRAVENOUS | Status: AC
  Filled 2019-02-28: qty 20

## 2019-02-28 MED ORDER — OXYCODONE HCL 5 MG/5ML PO SOLN: 5.0000 mg | Freq: Once | ORAL | Status: AC | PRN

## 2019-02-28 MED ORDER — LACTATED RINGERS IV SOLN
INTRAVENOUS | Status: DC
  Administered 2019-02-28: 11:00:00 via INTRAVENOUS

## 2019-02-28 MED ORDER — FENTANYL CITRATE (PF) 100 MCG/2ML IJ SOLN
INTRAMUSCULAR | Status: AC
  Filled 2019-02-28: qty 2

## 2019-02-28 MED ORDER — PROPOFOL 10 MG/ML IV BOLUS
INTRAVENOUS | Status: DC | PRN
  Administered 2019-02-28: 100 mg via INTRAVENOUS

## 2019-02-28 MED ORDER — ACETAMINOPHEN 160 MG/5ML PO SOLN: 1000.0000 mg | Freq: Once | ORAL | Status: DC | PRN

## 2019-02-28 MED ORDER — LIDOCAINE 2% (20 MG/ML) 5 ML SYRINGE
INTRAMUSCULAR | Status: DC | PRN
  Administered 2019-02-28: 60 mg via INTRAVENOUS

## 2019-02-28 MED ORDER — ACETAMINOPHEN 500 MG PO TABS: 1000.0000 mg | ORAL_TABLET | Freq: Once | ORAL | Status: DC | PRN

## 2019-02-28 MED ORDER — DEXAMETHASONE SODIUM PHOSPHATE 10 MG/ML IJ SOLN
INTRAMUSCULAR | Status: DC | PRN
  Administered 2019-02-28: 10 mg via INTRAVENOUS

## 2019-02-28 MED ORDER — CEFAZOLIN SODIUM-DEXTROSE 2-4 GM/100ML-% IV SOLN
2.0000 g | INTRAVENOUS | Status: AC
  Administered 2019-02-28: 13:00:00 2 g via INTRAVENOUS
  Filled 2019-02-28: qty 100

## 2019-02-28 MED ORDER — OXYCODONE HCL 5 MG PO TABS
5.0000 mg | ORAL_TABLET | Freq: Once | ORAL | Status: AC | PRN
  Administered 2019-02-28: 5 mg via ORAL

## 2019-02-28 MED ORDER — ONDANSETRON HCL 4 MG/2ML IJ SOLN
INTRAMUSCULAR | Status: AC
  Filled 2019-02-28: qty 2

## 2019-02-28 MED ORDER — STERILE WATER FOR IRRIGATION IR SOLN
Status: DC | PRN
  Administered 2019-02-28: 500 mL

## 2019-02-28 MED ORDER — FENTANYL CITRATE (PF) 100 MCG/2ML IJ SOLN
25.0000 ug | INTRAMUSCULAR | Status: DC | PRN
  Administered 2019-02-28 (×3): 50 ug via INTRAVENOUS

## 2019-02-28 MED ORDER — ACETAMINOPHEN 10 MG/ML IV SOLN: 1000.0000 mg | Freq: Once | INTRAVENOUS | Status: DC | PRN

## 2019-02-28 MED ORDER — OXYCODONE HCL 5 MG PO TABS
ORAL_TABLET | ORAL | Status: AC
  Filled 2019-02-28: qty 1

## 2019-02-28 SURGICAL SUPPLY — 19 items
BAG URINE DRAINAGE (UROLOGICAL SUPPLIES) ×3 IMPLANT
BAG URO CATCHER STRL LF (MISCELLANEOUS) ×3 IMPLANT
CATH FOLEY 3WAY 30CC 22FR (CATHETERS) ×3 IMPLANT
COVER WAND RF STERILE (DRAPES) IMPLANT
ELECT REM PT RETURN 15FT ADLT (MISCELLANEOUS) ×3 IMPLANT
GLOVE BIOGEL M 8.0 STRL (GLOVE) ×3 IMPLANT
GOWN STRL REUS W/TWL LRG LVL3 (GOWN DISPOSABLE) ×3 IMPLANT
GOWN STRL REUS W/TWL XL LVL3 (GOWN DISPOSABLE) ×3 IMPLANT
HOLDER FOLEY CATH W/STRAP (MISCELLANEOUS) IMPLANT
KIT TURNOVER KIT A (KITS) IMPLANT
LOOP CUT BIPOLAR 24F LRG (ELECTROSURGICAL) ×3 IMPLANT
MANIFOLD NEPTUNE II (INSTRUMENTS) ×3 IMPLANT
PACK CYSTO (CUSTOM PROCEDURE TRAY) ×3 IMPLANT
PLUG CATH AND CAP STER (CATHETERS) ×3 IMPLANT
SYR 30ML LL (SYRINGE) ×6 IMPLANT
SYRINGE IRR TOOMEY STRL 70CC (SYRINGE) ×6 IMPLANT
TUBING CONNECTING 10 (TUBING) ×2 IMPLANT
TUBING CONNECTING 10' (TUBING) ×1
TUBING UROLOGY SET (TUBING) ×3 IMPLANT

## 2019-02-28 NOTE — Op Note (Signed)
.  Preoperative diagnosis: gross hematuria  Postoperative diagnosis: Same  Procedure: 1 cystoscopy 2. Clot evacuation 3. fulgeration of bladder and prostatic urethra Attending: Rosie Fate  Anesthesia: General  Estimated blood loss: Minimal  Drains: 22 French foley  Specimens: none  Antibiotics: ancef  Findings: multiple areas of irritation from foley catheter. 80cc clot in the bladder. No papillary tumors visualized. radiation changes to the bladder Friable left lateral lobe tissue  Ureteral orifices in normal anatomic location.   Indications: Patient is a 85 year old male with a history of gross hematuria and radiation cystitis/prostatitis.  After discussing treatment options, they decided proceed with cystoscopy and fulgeration.  Procedure her in detail: The patient was brought to the operating room and a brief timeout was done to ensure correct patient, correct procedure, correct site.  General anesthesia was administered patient was placed in dorsal lithotomy position.  Their genitalia was then prepped and draped in usual sterile fashion.  A rigid 19 French cystoscope was passed in the urethra and the bladder.  Bladder was inspected and we noted a large amount of clot and friable prostatic urethra tissue.  the ureteral orifices were in the normal orthotopic locations.  We then removed the cystoscope and placed a resectoscope into the bladder. We proceeded to fulgerate multiple areas in bladder as well as the left lateral lobe of the prostate Hemostasis was then obtained with electrocautery. We then re-inspected the bladder and found no residual bleeding.  the bladder was then drained, a 22 French foley was placed and this concluded the procedure which was well tolerated by patient.  Complications: None  Condition: Stable, extubated, transferred to PACU  Plan: Patient is to be discharged home and followup in 5 days for foley catheter removal.

## 2019-02-28 NOTE — H&P (Signed)
Urology Admission H&P  Chief Complaint: gross hematuria  History of Present Illness: [name] is a 85yo with a hx of prostate cancer s/p IMRT who developed gross hematuria and was found to have friable prostatic urethra tissue. He developed clot retention and has a foley in place. No worsening LUTS.  Past Medical History:  Diagnosis Date  . Anxiety   . Asthma    as child  . Bladder cancer [facility]) urologist-  dr Elliot Gault [facility] in [place], [place])   dx 01/ 2016--- post TURBT and post Bladder bx 02-10-2017  . Bladder tumor   . BPH (benign prostatic hyperplasia)   . Frequency of urination   . History of asthma    childhood  . History of external beam radiation therapy    2002  prostate/ pelvis and boost w/ radioactive prostate seed implants   . History of hypercalcemia    s/p  parathyroidectomy 2003  . History of prostate cancer current urologist-  dr Elliot Gault [facility] in [place], [place])-- per lov note (care everywhere) last PSA undetectable   dx 2002--  urologist-- dr dahlstedt/ oncologist dr Valere Dross---  Gleason 7 out of 10, PSA 4.9---post Radioactive Prostate Seed Implant and External Beam Radiation therapy  . History of skin cancer   . Hyperlipidemia   . Multiple lung nodules    since 2006--- followed by pcp --- dr Maudie Mercury  . Renal cyst, left   . Wears glasses   . Wears hearing aid in both ears    Past Surgical History:  Procedure Laterality Date  . CARDIOVASCULAR STRESS TEST  01/01/2004   normal nuclear perfustion study w/ no ischemia/  normal LV function and wall motion, ef 65%  . CATARACT EXTRACTION W/ INTRAOCULAR LENS  IMPLANT, BILATERAL  2010  . COLONOSCOPY    . CYSTO/  BILATERAL RETROGRADE PYELOGRAM/  BLADDER BX'S AND FULGERATION  02-10-2017   dr Anabel Bene [facility] in [place], [place]  . CYSTOSCOPY W/ RETROGRADES Bilateral 07/27/2017   Procedure: CYSTOSCOPY,SELECTIVE CYTOLOGIES,RETROGRADE PYELOGRAM;  Surgeon: Cleon Gustin, MD;  Location: [facility];  Service: Urology;  Laterality: Bilateral;  . CYSTOSCOPY W/ RETROGRADES Bilateral 12/08/2017   Procedure: CYSTOSCOPY WITH RETROGRADE PYELOGRAM;  Surgeon: Cleon Gustin, MD;  Location: [facility];  Service: Urology;  Laterality: Bilateral;  . CYSTOSCOPY WITH BIOPSY N/A 07/27/2017   Procedure: CYSTOSCOPY WITH BIOPSY OF BLADDER AND PROSTATIC URETHRA;  Surgeon: Cleon Gustin, MD;  Location: [facility];  Service: Urology;  Laterality: N/A;  . CYSTOSCOPY WITH BIOPSY Bilateral 12/08/2017   Procedure: CYSTOSCOPY WITH RENAL WASHINGS;  Surgeon: Cleon Gustin, MD;  Location: [facility];  Service: Urology;  Laterality: Bilateral;  . FIBEROPTIC BRONCHOSCOPY  08-20-2005   dr wert   w/ Left lower lobe bx  . PARATHYROIDECTOMY  2003  . RADIOACTIVE PROSTATE SEED IMPLANTS  04-05-2001    dr Diona Fanti [facility]  . TONSILLECTOMY  child  . TRANSURETHRAL RESECTION OF BLADDER TUMOR  11-24-2014   dr Anabel Bene [facility] in [place], [place]  . TRANSURETHRAL RESECTION OF BLADDER TUMOR N/A 04/08/2018   Procedure: TRANSURETHRAL RESECTION OF BLADDER TUMOR (TURBT);  Surgeon: Cleon Gustin, MD;  Location: [facility];  Service: Urology;  Laterality: N/A;  . UPPER GI ENDOSCOPY      Home Medications:  Current Facility-Administered Medications  Medication Dose Route Frequency Provider Last Rate Last Dose  . ceFAZolin (ANCEF) IVPB 2g/100 mL premix  2 g  Intravenous 30 min Pre-Op Cleon Gustin, MD      . lactated ringers infusion   Intravenous Continuous Oleta Mouse, MD 50 mL/hr at 02/28/19 1123     Allergies:  Allergies  Allergen Reactions  . Adhesive [Tape] Other (See Comments)    Tears skin off PAPER TAPE IS OKAY    History reviewed. No pertinent family history. Social History:  reports that he quit smoking about 34 years ago. His smoking use included cigarettes. He quit after 50.00 years  of use. He has never used smokeless tobacco. He reports current alcohol use of about 7.0 standard drinks of alcohol per week. He reports that he does not use drugs.  Review of Systems  Genitourinary: Positive for hematuria.  All other systems reviewed and are negative.   Physical Exam:  Vital signs in last 24 hours: Temp:  [97.5 F (36.4 C)] 97.5 F (36.4 C) (04/27 1107) Pulse Rate:  [102] 102 (04/27 1107) Resp:  [18] 18 (04/27 1107) BP: (138)/(75) 138/75 (04/27 1107) SpO2:  [99 %] 99 % (04/27 1107) Weight:  [59.4 kg] 59.4 kg (04/27 1107) Physical Exam  Constitutional: He is oriented to person, place, and time. He appears well-developed and well-nourished.  HENT:  Head: Normocephalic and atraumatic.  Eyes: Pupils are equal, round, and reactive to light. EOM are normal.  Neck: Normal range of motion. No thyromegaly present.  Cardiovascular: Normal rate and regular rhythm.  Respiratory: Effort normal. No respiratory distress.  GI: Soft. He exhibits no distension.  Musculoskeletal: Normal range of motion.        General: No edema.  Neurological: He is alert and oriented to person, place, and time.  Skin: Skin is warm and dry.  Psychiatric: He has a normal mood and affect. His behavior is normal. Judgment and thought content normal.    Laboratory Data:  No results found for this or any previous visit (from the past 24 hour(s)). Recent Results (from the past 240 hour(s))  Urine culture     Status: Abnormal   Collection Time: 02/26/19  7:53 PM  Result Value Ref Range Status   Specimen Description   Final    URINE, CLEAN CATCH Performed at [facility], [address] [place], [place] [address]    Special Requests   Final    NONE Performed at [facility], [address] [place], [place] [address]    Culture (A)  Final    <10,000 COLONIES/mL INSIGNIFICANT GROWTH Performed at [facility], [address] [place], [place] [address]    Report  Status 02/28/2019 FINAL  Final   Creatinine: No results for input(s): CREATININE in the last 168 hours. Baseline Creatinine: unknown  Impression/Assessment:  86yo with prostatic uretrha mass  Plan:   The risks/benefits/alternatives to transurethral resection was explained to the patient and he understands and wishes to proceed with surgery  Nicolette Bang 02/28/2019, 12:31 PM

## 2019-02-28 NOTE — Discharge Instructions (Signed)

## 2019-02-28 NOTE — Transfer of Care (Signed)
Immediate Anesthesia Transfer of Care Note  Patient: Jesse [name]  Procedure(s) Performed: TRANSURETHRAL RESECTION OF THE PROSTATE (TURP) (N/A )  Patient Location: PACU  Anesthesia Type:General  Level of Consciousness: drowsy  Airway & Oxygen Therapy: Patient Spontanous Breathing and Patient connected to face mask oxygen  Post-op Assessment: Report given to RN and Post -op Vital signs reviewed and stable  Post vital signs: Reviewed and stable  Last Vitals:  Vitals Value Taken Time  BP    Temp    Pulse    Resp    SpO2      Last Pain:  Vitals:   02/28/19 1107  TempSrc: Oral         Complications: No apparent anesthesia complications

## 2019-02-28 NOTE — Anesthesia Procedure Notes (Signed)
Procedure Name: LMA Insertion Date/Time: 02/28/2019 1:12 PM Performed by: Sharlette Dense, CRNA Patient Re-evaluated:Patient Re-evaluated prior to induction Oxygen Delivery Method: Circle system utilized Preoxygenation: Pre-oxygenation with 100% oxygen Induction Type: IV induction LMA: LMA inserted LMA Size: 4.0 Number of attempts: 1 Placement Confirmation: positive ETCO2 and breath sounds checked- equal and bilateral Tube secured with: Tape Dental Injury: Teeth and Oropharynx as per pre-operative assessment

## 2019-02-28 NOTE — Anesthesia Preprocedure Evaluation (Signed)
Anesthesia Evaluation  Patient identified by MRN, date of birth, ID band Patient awake    Reviewed: Allergy & Precautions, NPO status , Patient's Chart, lab work & pertinent test results  History of Anesthesia Complications Negative for: history of anesthetic complications  Airway Mallampati: II  TM Distance: >3 FB Neck ROM: Full    Dental  (+) Dental Advisory Given   Pulmonary asthma , former smoker,  Multiple lung nodules   breath sounds clear to auscultation       Cardiovascular negative cardio ROS   Rhythm:Regular     Neuro/Psych PSYCHIATRIC DISORDERS Anxiety negative neurological ROS     GI/Hepatic Neg liver ROS,   Endo/Other  negative endocrine ROS  Renal/GU Renal disease     Musculoskeletal negative musculoskeletal ROS (+)   Abdominal   Peds  Hematology negative hematology ROS (+)   Anesthesia Other Findings   Reproductive/Obstetrics                             Anesthesia Physical Anesthesia Plan  ASA: II  Anesthesia Plan: General   Post-op Pain Management:    Induction: Intravenous and Rapid sequence  PONV Risk Score and Plan: 2 and Ondansetron and Dexamethasone  Airway Management Planned: Oral ETT  Additional Equipment: None  Intra-op Plan:   Post-operative Plan: Extubation in OR  Informed Consent: I have reviewed the patients History and Physical, chart, labs and discussed the procedure including the risks, benefits and alternatives for the proposed anesthesia with the patient or authorized representative who has indicated his/her understanding and acceptance.     Dental advisory given  Plan Discussed with: CRNA and Surgeon  Anesthesia Plan Comments:         Anesthesia Quick Evaluation

## 2019-03-01 ENCOUNTER — Encounter (HOSPITAL_COMMUNITY): Payer: Self-pay | Admitting: Urology

## 2019-03-08 NOTE — Anesthesia Postprocedure Evaluation (Signed)
Anesthesia Post Note  Patient: Donalynn Furlong III  Procedure(s) Performed: TRANSURETHRAL RESECTION OF THE PROSTATE (TURP) (N/A )     Patient location during evaluation: PACU Anesthesia Type: General Level of consciousness: awake and alert Pain management: pain level controlled Vital Signs Assessment: post-procedure vital signs reviewed and stable Respiratory status: spontaneous breathing, nonlabored ventilation, respiratory function stable and patient connected to nasal cannula oxygen Cardiovascular status: blood pressure returned to baseline and stable Postop Assessment: no apparent nausea or vomiting Anesthetic complications: no    Last Vitals:  Vitals:   02/28/19 1515 02/28/19 1523  BP: (!) 145/79 (!) 130/94  Pulse: 95 92  Resp: 15 18  Temp: 36.6 C   SpO2: 96%     Last Pain:  Vitals:   03/01/19 1306  TempSrc:   PainSc: 0-No pain                 Daekwon Beswick

## 2019-11-20 ENCOUNTER — Encounter (HOSPITAL_COMMUNITY): Payer: Self-pay

## 2019-11-20 ENCOUNTER — Inpatient Hospital Stay (HOSPITAL_COMMUNITY)
Admission: EM | Admit: 2019-11-20 | Discharge: 2019-11-25 | DRG: 177 | Disposition: A | Discharge status: Skilled Nursing Facility | Payer: Medicare Other | Attending: Internal Medicine | Admitting: Internal Medicine

## 2019-11-20 ENCOUNTER — Emergency Department (HOSPITAL_COMMUNITY): Payer: Medicare Other

## 2019-11-20 DIAGNOSIS — Z8551 Personal history of malignant neoplasm of bladder: Secondary | ICD-10-CM

## 2019-11-20 DIAGNOSIS — J1282 Pneumonia due to coronavirus disease 2019: Secondary | ICD-10-CM | POA: Diagnosis not present

## 2019-11-20 DIAGNOSIS — N400 Enlarged prostate without lower urinary tract symptoms: Secondary | ICD-10-CM | POA: Diagnosis present

## 2019-11-20 DIAGNOSIS — Z961 Presence of intraocular lens: Secondary | ICD-10-CM | POA: Diagnosis present

## 2019-11-20 DIAGNOSIS — F419 Anxiety disorder, unspecified: Secondary | ICD-10-CM | POA: Diagnosis present

## 2019-11-20 DIAGNOSIS — Z79899 Other long term (current) drug therapy: Secondary | ICD-10-CM | POA: Diagnosis not present

## 2019-11-20 DIAGNOSIS — R7401 Elevation of levels of liver transaminase levels: Secondary | ICD-10-CM | POA: Diagnosis present

## 2019-11-20 DIAGNOSIS — E876 Hypokalemia: Secondary | ICD-10-CM | POA: Diagnosis not present

## 2019-11-20 DIAGNOSIS — E785 Hyperlipidemia, unspecified: Secondary | ICD-10-CM | POA: Diagnosis present

## 2019-11-20 DIAGNOSIS — U071 COVID-19: Secondary | ICD-10-CM | POA: Diagnosis present

## 2019-11-20 DIAGNOSIS — J9811 Atelectasis: Secondary | ICD-10-CM | POA: Diagnosis present

## 2019-11-20 DIAGNOSIS — J9601 Acute respiratory failure with hypoxia: Secondary | ICD-10-CM | POA: Diagnosis present

## 2019-11-20 DIAGNOSIS — Z9842 Cataract extraction status, left eye: Secondary | ICD-10-CM | POA: Diagnosis not present

## 2019-11-20 DIAGNOSIS — Z923 Personal history of irradiation: Secondary | ICD-10-CM | POA: Diagnosis not present

## 2019-11-20 DIAGNOSIS — Z8546 Personal history of malignant neoplasm of prostate: Secondary | ICD-10-CM

## 2019-11-20 DIAGNOSIS — Z9841 Cataract extraction status, right eye: Secondary | ICD-10-CM | POA: Diagnosis not present

## 2019-11-20 DIAGNOSIS — E440 Moderate protein-calorie malnutrition: Secondary | ICD-10-CM | POA: Diagnosis present

## 2019-11-20 DIAGNOSIS — Z87891 Personal history of nicotine dependence: Secondary | ICD-10-CM | POA: Diagnosis not present

## 2019-11-20 DIAGNOSIS — J9600 Acute respiratory failure, unspecified whether with hypoxia or hypercapnia: Secondary | ICD-10-CM | POA: Diagnosis present

## 2019-11-20 DIAGNOSIS — Z85828 Personal history of other malignant neoplasm of skin: Secondary | ICD-10-CM | POA: Diagnosis not present

## 2019-11-20 DIAGNOSIS — Z974 Presence of external hearing-aid: Secondary | ICD-10-CM | POA: Diagnosis not present

## 2019-11-20 DIAGNOSIS — Z66 Do not resuscitate: Secondary | ICD-10-CM | POA: Diagnosis present

## 2019-11-20 DIAGNOSIS — J45909 Unspecified asthma, uncomplicated: Secondary | ICD-10-CM | POA: Diagnosis present

## 2019-11-20 LAB — COMPREHENSIVE METABOLIC PANEL
ALT: 54 U/L — ABNORMAL HIGH (ref 0–44)
AST: 57 U/L — ABNORMAL HIGH (ref 15–41)
Albumin: 3.4 g/dL — ABNORMAL LOW (ref 3.5–5.0)
Alkaline Phosphatase: 60 U/L (ref 38–126)
Anion gap: 12 (ref 5–15)
BUN: 44 mg/dL — ABNORMAL HIGH (ref 8–23)
CO2: 26 mmol/L (ref 22–32)
Calcium: 8.4 mg/dL — ABNORMAL LOW (ref 8.9–10.3)
Chloride: 99 mmol/L (ref 98–111)
Creatinine, Ser: 1.28 mg/dL — ABNORMAL HIGH (ref 0.61–1.24)
GFR calc Af Amer: 59 mL/min — ABNORMAL LOW (ref >60)
GFR calc non Af Amer: 51 mL/min — ABNORMAL LOW (ref >60)
Glucose, Bld: 112 mg/dL — ABNORMAL HIGH (ref 70–99)
Potassium: 3.4 mmol/L — ABNORMAL LOW (ref 3.5–5.1)
Sodium: 137 mmol/L (ref 135–145)
Total Bilirubin: 1 mg/dL (ref 0.3–1.2)
Total Protein: 7.1 g/dL (ref 6.5–8.1)

## 2019-11-20 LAB — CBC WITH DIFFERENTIAL/PLATELET
Abs Immature Granulocytes: 0.06 10*3/uL (ref 0.00–0.07)
Basophils Absolute: 0 10*3/uL (ref 0.0–0.1)
Basophils Relative: 0 %
Eosinophils Absolute: 0.7 10*3/uL — ABNORMAL HIGH (ref 0.0–0.5)
Eosinophils Relative: 6 %
HCT: 42.3 % (ref 39.0–52.0)
Hemoglobin: 14.1 g/dL (ref 13.0–17.0)
Immature Granulocytes: 1 %
Lymphocytes Relative: 11 %
Lymphs Abs: 1.2 10*3/uL (ref 0.7–4.0)
MCH: 31.3 pg (ref 26.0–34.0)
MCHC: 33.3 g/dL (ref 30.0–36.0)
MCV: 94 fL (ref 80.0–100.0)
Monocytes Absolute: 1 10*3/uL (ref 0.1–1.0)
Monocytes Relative: 9 %
Neutro Abs: 8.5 10*3/uL — ABNORMAL HIGH (ref 1.7–7.7)
Neutrophils Relative %: 73 %
Platelets: 233 10*3/uL (ref 150–400)
RBC: 4.5 MIL/uL (ref 4.22–5.81)
RDW: 13.8 % (ref 11.5–15.5)
WBC: 11.6 10*3/uL — ABNORMAL HIGH (ref 4.0–10.5)
nRBC: 0 % (ref 0.0–0.2)

## 2019-11-20 LAB — CBC
HCT: 40.1 % (ref 39.0–52.0)
Hemoglobin: 13.7 g/dL (ref 13.0–17.0)
MCH: 31.8 pg (ref 26.0–34.0)
MCHC: 34.2 g/dL (ref 30.0–36.0)
MCV: 93 fL (ref 80.0–100.0)
Platelets: 225 10*3/uL (ref 150–400)
RBC: 4.31 MIL/uL (ref 4.22–5.81)
RDW: 13.9 % (ref 11.5–15.5)
WBC: 12.9 10*3/uL — ABNORMAL HIGH (ref 4.0–10.5)
nRBC: 0 % (ref 0.0–0.2)

## 2019-11-20 LAB — POC SARS CORONAVIRUS 2 AG -  ED: SARS Coronavirus 2 Ag: NEGATIVE

## 2019-11-20 LAB — PROCALCITONIN: Procalcitonin: 1.86 ng/mL

## 2019-11-20 LAB — FERRITIN: Ferritin: 706 ng/mL — ABNORMAL HIGH (ref 24–336)

## 2019-11-20 LAB — FIBRINOGEN: Fibrinogen: >800 mg/dL — ABNORMAL HIGH (ref 210–475)

## 2019-11-20 LAB — D-DIMER, QUANTITATIVE: D-Dimer, Quant: 1.46 ug/mL-FEU — ABNORMAL HIGH (ref 0.00–0.50)

## 2019-11-20 LAB — CREATININE, SERUM
Creatinine, Ser: 1.1 mg/dL (ref 0.61–1.24)
GFR calc Af Amer: >60 mL/min (ref >60)
GFR calc non Af Amer: >60 mL/min (ref >60)

## 2019-11-20 LAB — LACTIC ACID, PLASMA
Lactic Acid, Venous: 1.4 mmol/L (ref 0.5–1.9)
Lactic Acid, Venous: 1.6 mmol/L (ref 0.5–1.9)

## 2019-11-20 LAB — C-REACTIVE PROTEIN: CRP: 25.2 mg/dL — ABNORMAL HIGH (ref <1.0)

## 2019-11-20 LAB — TRIGLYCERIDES: Triglycerides: 91 mg/dL (ref <150)

## 2019-11-20 LAB — LACTATE DEHYDROGENASE: LDH: 269 U/L — ABNORMAL HIGH (ref 98–192)

## 2019-11-20 MED ORDER — TAMSULOSIN HCL 0.4 MG PO CAPS
0.4000 mg | ORAL_CAPSULE | Freq: Two times a day (BID) | ORAL | Status: DC
  Administered 2019-11-20 – 2019-11-25 (×10): 0.4 mg via ORAL
  Filled 2019-11-20 (×10): qty 1

## 2019-11-20 MED ORDER — HYDROCOD POLST-CPM POLST ER 10-8 MG/5ML PO SUER
5.0000 mL | Freq: Two times a day (BID) | ORAL | Status: DC | PRN
  Filled 2019-11-20: qty 5

## 2019-11-20 MED ORDER — PROSIGHT PO TABS
1.0000 | ORAL_TABLET | Freq: Two times a day (BID) | ORAL | Status: DC
  Administered 2019-11-20 – 2019-11-25 (×9): 1 via ORAL
  Filled 2019-11-20 (×11): qty 1

## 2019-11-20 MED ORDER — SODIUM CHLORIDE 0.9 % IV SOLN
200.0000 mg | Freq: Once | INTRAVENOUS | Status: AC
  Administered 2019-11-20: 200 mg via INTRAVENOUS
  Filled 2019-11-20: qty 200

## 2019-11-20 MED ORDER — SODIUM CHLORIDE 0.9 % IV SOLN
100.0000 mg | Freq: Every day | INTRAVENOUS | Status: AC
  Administered 2019-11-21 – 2019-11-24 (×4): 100 mg via INTRAVENOUS
  Filled 2019-11-20 (×3): qty 20
  Filled 2019-11-20: qty 100

## 2019-11-20 MED ORDER — ALBUTEROL SULFATE HFA 108 (90 BASE) MCG/ACT IN AERS
2.0000 | INHALATION_SPRAY | Freq: Four times a day (QID) | RESPIRATORY_TRACT | Status: DC
  Administered 2019-11-20: 2 via RESPIRATORY_TRACT
  Filled 2019-11-20 (×2): qty 6.7

## 2019-11-20 MED ORDER — SODIUM CHLORIDE 0.9 % IV SOLN: 200.0000 mg | Freq: Once | INTRAVENOUS | Status: DC

## 2019-11-20 MED ORDER — DEXAMETHASONE SODIUM PHOSPHATE 10 MG/ML IJ SOLN
6.0000 mg | INTRAMUSCULAR | Status: DC
  Administered 2019-11-20: 6 mg via INTRAVENOUS
  Filled 2019-11-20: qty 1

## 2019-11-20 MED ORDER — THIAMINE HCL 100 MG PO TABS
100.0000 mg | ORAL_TABLET | Freq: Every day | ORAL | Status: DC
  Administered 2019-11-21 – 2019-11-25 (×5): 100 mg via ORAL
  Filled 2019-11-20 (×5): qty 1

## 2019-11-20 MED ORDER — DARIFENACIN HYDROBROMIDE ER 15 MG PO TB24
15.0000 mg | ORAL_TABLET | Freq: Every day | ORAL | Status: DC
  Administered 2019-11-21 – 2019-11-25 (×5): 15 mg via ORAL
  Filled 2019-11-20 (×5): qty 1

## 2019-11-20 MED ORDER — ACETAMINOPHEN 325 MG PO TABS
650.0000 mg | ORAL_TABLET | Freq: Four times a day (QID) | ORAL | Status: DC | PRN
  Administered 2019-11-24 – 2019-11-25 (×2): 650 mg via ORAL
  Filled 2019-11-20 (×2): qty 2

## 2019-11-20 MED ORDER — SODIUM CHLORIDE 0.9 % IV SOLN: 100.0000 mg | Freq: Every day | INTRAVENOUS | Status: DC

## 2019-11-20 MED ORDER — PRAVASTATIN SODIUM 40 MG PO TABS
40.0000 mg | ORAL_TABLET | Freq: Every day | ORAL | Status: DC
  Administered 2019-11-21 – 2019-11-25 (×5): 40 mg via ORAL
  Filled 2019-11-20: qty 2
  Filled 2019-11-20 (×4): qty 1

## 2019-11-20 MED ORDER — ENOXAPARIN SODIUM 40 MG/0.4ML ~~LOC~~ SOLN
40.0000 mg | SUBCUTANEOUS | Status: DC
  Administered 2019-11-20 – 2019-11-24 (×5): 40 mg via SUBCUTANEOUS
  Filled 2019-11-20 (×5): qty 0.4

## 2019-11-20 MED ORDER — ACETAMINOPHEN 325 MG PO TABS
650.0000 mg | ORAL_TABLET | Freq: Once | ORAL | Status: AC
  Administered 2019-11-20: 650 mg via ORAL
  Filled 2019-11-20: qty 2

## 2019-11-20 MED ORDER — ZINC SULFATE 220 (50 ZN) MG PO CAPS
220.0000 mg | ORAL_CAPSULE | Freq: Every day | ORAL | Status: DC
  Administered 2019-11-21 – 2019-11-25 (×5): 220 mg via ORAL
  Filled 2019-11-20 (×5): qty 1

## 2019-11-20 MED ORDER — VITAMIN D3 25 MCG (1000 UNIT) PO TABS
1000.0000 [IU] | ORAL_TABLET | Freq: Every day | ORAL | Status: DC
  Administered 2019-11-21 – 2019-11-25 (×5): 1000 [IU] via ORAL
  Filled 2019-11-20 (×9): qty 1

## 2019-11-20 MED ORDER — ALBUTEROL SULFATE HFA 108 (90 BASE) MCG/ACT IN AERS
2.0000 | INHALATION_SPRAY | Freq: Four times a day (QID) | RESPIRATORY_TRACT | Status: DC
  Administered 2019-11-21 (×2): 2 via RESPIRATORY_TRACT
  Filled 2019-11-20: qty 6.7

## 2019-11-20 MED ORDER — ASCORBIC ACID 500 MG PO TABS
500.0000 mg | ORAL_TABLET | Freq: Every day | ORAL | Status: DC
  Administered 2019-11-21 – 2019-11-25 (×5): 500 mg via ORAL
  Filled 2019-11-20 (×5): qty 1

## 2019-11-20 NOTE — ED Triage Notes (Signed)
He c/o persistent and worsening shortness of breath. He has tested positive (where?) for COVID. EMS found him to have an SPO2 in the low 80's on rm. Air. His oxygenation was found to be sufficient at 4 l.p.m. he arrives awake, alert and in no distress.

## 2019-11-20 NOTE — H&P (Signed)
History and Physical        Hospital Admission Note Date: 11/20/2019  Patient name: Jesse [name] Medical record number: [id number] Date of birth: Sep 27, 1932 Age: 86 y.o. Gender: male  PCP: Jani Gravel, MD    Patient coming from: Home  I have reviewed all records in the [place].    Chief Complaint:  Shortness of breath  HPI: Patient is a 86 year old male with history of anxiety, BPH, bladder CA post radioactive prostate seed implant, XRT presented to ED for worsening shortness of breath.  Patient reported that he and his wife had tested positive for Covid on 1/14.  Patient reported that his symptoms started on last Tuesday on 11/15/2019, initially with fevers and chills, loss of appetite, fatigue generalized weakness.  Patient reported that his symptoms are progressively worsening and now felt more short of breath.   Patient and his wife are brought to ED today.  EMS noted his O2 sats in low 80s on room air. At the time of my examination, patient is visibly short of breath, O2 sats 91% on 4 L.  Alert and oriented.  At baseline, not dependent on O2.  ED work-up/course:  Temp 97.9, RR 24, heart rate 91, BP 127/70, O2 sats 91% on 4 L during my examination. Point-of-care Covid test negative however confirmatory test pending Unable to find patient's Covid positive test that was done outpatient  Sodium 137, potassium 3.4, BUN 44, creatinine 1.28 LDH 269, ferritin 706, CRP 25.2, lactic acid 1.6 WBCs 11.6 D-dimer 1.46 Procalcitonin pending Fibrinogen> 800  Review of Systems: Positives marked in 'bold' Constitutional: +  fever, chills, diaphoresis, poor appetite and fatigue.  HEENT: Denies photophobia, eye pain, redness, hearing loss, ear pain, congestion, sore throat, rhinorrhea, sneezing, mouth sores, trouble swallowing, neck pain, neck stiffness and tinnitus.     Respiratory: Please see HPI Cardiovascular: Denies chest pain, palpitations and leg swelling.  Gastrointestinal: Nausea, poor appetite, no abdominal pain, no vomiting or diarrhea. Genitourinary: Denies dysuria, urgency, frequency, hematuria, flank pain and difficulty urinating.  Musculoskeletal: Denies  back pain, joint swelling, arthralgias and gait problem.  Skin: Denies pallor, rash and wound.  Neurological: Worsening generalized weakness, myalgias Hematological: Denies adenopathy. Easy bruising, personal or family bleeding history  Psychiatric/Behavioral: Denies suicidal ideation, mood changes, confusion, nervousness, sleep disturbance and agitation  Past Medical History: Past Medical History:  Diagnosis Date  . Anxiety   . Asthma    as child  . Bladder cancer [facility]) urologist-  dr Elliot Gault [facility] in [place], [place])   dx 01/ 2016--- post TURBT and post Bladder bx 02-10-2017  . Bladder tumor   . BPH (benign prostatic hyperplasia)   . Frequency of urination   . History of asthma    childhood  . History of external beam radiation therapy    2002  prostate/ pelvis and boost w/ radioactive prostate seed implants   . History of hypercalcemia    s/p  parathyroidectomy 2003  . History of prostate cancer current urologist-  dr Elliot Gault [facility] in [place], [place])-- per lov note (care everywhere) last PSA undetectable   dx 2002--  urologist-- dr dahlstedt/ oncologist dr  murray---  Gleason 7 out of 10, PSA 4.9---post Radioactive Prostate Seed Implant and External Beam Radiation therapy  . History of skin cancer   . Hyperlipidemia   . Multiple lung nodules    since 2006--- followed by pcp --- dr Maudie Mercury  . Renal cyst, left   . Wears glasses   . Wears hearing aid in both ears     Past Surgical History:  Procedure Laterality Date  . CARDIOVASCULAR STRESS TEST  01/01/2004   normal nuclear perfustion study w/ no ischemia/  normal LV function and wall motion, ef  65%  . CATARACT EXTRACTION W/ INTRAOCULAR LENS  IMPLANT, BILATERAL  2010  . COLONOSCOPY    . CYSTO/  BILATERAL RETROGRADE PYELOGRAM/  BLADDER BX'S AND FULGERATION  02-10-2017   dr Anabel Bene [facility] in [place], [place]  . CYSTOSCOPY W/ RETROGRADES Bilateral 07/27/2017   Procedure: CYSTOSCOPY,SELECTIVE CYTOLOGIES,RETROGRADE PYELOGRAM;  Surgeon: Cleon Gustin, MD;  Location[facility];  Service: Urology;  Laterality: Bilateral;  . CYSTOSCOPY W/ RETROGRADES Bilateral 12/08/2017   Procedure: CYSTOSCOPY WITH RETROGRADE PYELOGRAM;  Surgeon: Cleon Gustin, MD;  Location: [facility];  Service: Urology;  Laterality: Bilateral;  . CYSTOSCOPY WITH BIOPSY N/A 07/27/2017   Procedure: CYSTOSCOPY WITH BIOPSY OF BLADDER AND PROSTATIC URETHRA;  Surgeon: Cleon Gustin, MD;  Location: [facility];  Service: Urology;  Laterality: N/A;  . CYSTOSCOPY WITH BIOPSY Bilateral 12/08/2017   Procedure: CYSTOSCOPY WITH RENAL WASHINGS;  Surgeon: Cleon Gustin, MD;  Location: [facility];  Service: Urology;  Laterality: Bilateral;  . FIBEROPTIC BRONCHOSCOPY  08-20-2005   dr wert   w/ Left lower lobe bx  . PARATHYROIDECTOMY  2003  . RADIOACTIVE PROSTATE SEED IMPLANTS  04-05-2001    dr Diona Fanti [facility]  . TONSILLECTOMY  child  . TRANSURETHRAL RESECTION OF BLADDER TUMOR  11-24-2014   dr Anabel Bene [facility] in [place], [place]  . TRANSURETHRAL RESECTION OF BLADDER TUMOR N/A 04/08/2018   Procedure: TRANSURETHRAL RESECTION OF BLADDER TUMOR (TURBT);  Surgeon: Cleon Gustin, MD;  Location: [facility];  Service: Urology;  Laterality: N/A;  . TRANSURETHRAL RESECTION OF PROSTATE N/A 02/28/2019   Procedure: TRANSURETHRAL RESECTION OF THE PROSTATE (TURP);  Surgeon: Cleon Gustin, MD;  Location: [facility];  Service: Urology;  Laterality: N/A;  30 MINS  . UPPER GI ENDOSCOPY      Medications: Prior to Admission  medications   Medication Sig Start Date End Date Taking? Authorizing Provider  acetaminophen (TYLENOL) 325 MG tablet Take 325 mg by mouth every 6 (six) hours as needed (for pain).    [provider]  chlordiazePOXIDE (LIBRIUM) 10 MG capsule Take 10 mg by mouth daily. In the morning    [provider]  Cholecalciferol (VITAMIN D3) 1000 units CAPS Take 1,000 Units by mouth daily.    [provider]  HYDROcodone-acetaminophen (NORCO) 5-325 MG tablet Take 1 tablet by mouth every 6 (six) hours as needed for moderate pain. 02/28/19   McKenzie, Candee Furbish, MD  Multiple Vitamins-Minerals (PRESERVISION AREDS 2) CAPS Take 1 capsule by mouth 2 (two) times daily.    [provider]  nystatin cream (MYCOSTATIN) Apply 1 application topically 2 (two) times daily as needed for dry skin (itching.).    [provider]  pravastatin (PRAVACHOL) 40 MG tablet Take 40 mg by mouth daily. In the morning. 09/30/14   [provider]  solifenacin (VESICARE) 10 MG tablet Take 10 mg  by mouth daily. In the morning.    [provider]  tamsulosin (FLOMAX) 0.4 MG CAPS capsule Take 0.4 mg by mouth 2 (two) times a day.     [provider]  thiamine 100 MG tablet Take 100 mg by mouth daily. In the morning. Vitamin B-1    [provider]    Allergies:   Allergies  Allergen Reactions  . Adhesive [Tape] Other (See Comments)    Tears skin off PAPER TAPE IS OKAY    Social History:  reports that he quit smoking about 35 years ago. His smoking use included cigarettes. He quit after 50.00 years of use. He has never used smokeless tobacco. He reports current alcohol use of about 7.0 standard drinks of alcohol per week. He reports that he does not use drugs.  Family History: No family history on file.  Physical Exam: Blood pressure 116/78, pulse (!) 101, temperature 97.9 F (36.6 C), temperature source Oral, resp. rate (!) 29, height 5\' 7"  (1.702 m),  weight 58.1 kg, SpO2 96 %. General: Alert, awake, oriented x3, in moderate acute distress, able to complete full sentences however visibly short of breath Eyes: pink conjunctiva,anicteric sclera, pupils equal and reactive to light and accomodation, HEENT: normocephalic, atraumatic, oropharynx clear Neck: supple, no masses or lymphadenopathy, no goiter, no bruits, no JVD CVS: Regular rate and rhythm, without murmurs, rubs or gallops. No lower extremity edema Resp : Decreased breath sound at the bases GI : Soft, nontender, nondistended, positive bowel sounds, no masses. No hepatomegaly. No hernia.  Musculoskeletal: No clubbing or cyanosis, positive pedal pulses. No contracture. ROM intact  Neuro: Grossly intact, no focal neurological deficits, strength 5/5 upper and lower extremities bilaterally Psych: alert and oriented x 3, normal mood and affect Skin: no rashes or lesions, warm and dry   LABS on Admission: I have personally reviewed all the labs and imagings below    Basic Metabolic Panel: Recent Labs  Lab 11/20/19 1202  NA 137  K 3.4*  CL 99  CO2 26  GLUCOSE 112*  BUN 44*  CREATININE 1.28*  CALCIUM 8.4*   Liver Function Tests: Recent Labs  Lab 11/20/19 1202  AST 57*  ALT 54*  ALKPHOS 60  BILITOT 1.0  PROT 7.1  ALBUMIN 3.4*   No results for input(s): LIPASE, AMYLASE in the last 168 hours. No results for input(s): AMMONIA in the last 168 hours. CBC: Recent Labs  Lab 11/20/19 1202  WBC 11.6*  NEUTROABS 8.5*  HGB 14.1  HCT 42.3  MCV 94.0  PLT 233   Cardiac Enzymes: No results for input(s): CKTOTAL, CKMB, CKMBINDEX, TROPONINI in the last 168 hours. BNP: Invalid input(s): POCBNP CBG: No results for input(s): GLUCAP in the last 168 hours.  Radiological Exams on Admission:  DG Chest Port 1 View  Result Date: 11/20/2019 CLINICAL DATA:  Shortness of breath. EXAM: PORTABLE CHEST 1 VIEW COMPARISON:  August 20, 2005. FINDINGS: The heart size and mediastinal  contours are within normal limits. No pneumothorax or pleural effusion is noted. Faint bibasilar opacities are noted concerning for subsegmental atelectasis or possibly infiltrate. The visualized skeletal structures are unremarkable. IMPRESSION: Faint bibasilar opacities are noted concerning for subsegmental atelectasis or possibly infiltrate. Electronically Signed   By: Marijo Conception M.D.   On: 11/20/2019 13:17      EKG: Independently reviewed.  Rate 94, biatrial enlargement, normal sinus rhythm   Assessment/Plan Principal Problem: Acute hypoxic respiratory failure due to acute COVID-19 viral pneumonia during the  ongoing COVID-19 pandemic- POA - Patient presented with hypoxia, shortness of breath, progressively worsening fatigue, myalgias, nausea, poor appetite, fevers and chills.  Chest x-ray showed bibasilar opacities noted concerning for subsegmental atelectasis or possible infiltrates - Currently hypoxic, requiring 4 L nasal cannula - Started on IV Decadron 6 mg IV daily, Remdesivir per pharmacy protocol -CRP elevated 25.2 with inflammatory markers elevated  - awaiting on confirmatory Covid test and procalcitonin.  Discussed with the patient about Actemra off label use - patient was told that if COVID-19 pneumonitis gets worse we might potentially use Actemra off label, patient denies any known history of tuberculosis or hepatitis, not on active chemotherapy, understands the risks and benefits and wants to proceed with Actemra treatment if required. -Patient agreeable for transfusion of convalescent plasma, if available - Continue Supportive care: vitamin C/zinc, albuterol, Tylenol. - Continue to wean oxygen, ambulatory O2 screening daily as tolerated  - Oxygen - SpO2: 96 % O2 Flow Rate (L/min): 4 L/min - Continue to follow labs as below  No results found for: [place]  Lab 11/20/19 1202 11/20/19 1203  DDIMER 1.46*  --   FERRITIN  --  706*  CRP  --  25.2*  ALT  54*  --   PROCALCITON 1.86  --        Active Problems:   BPH (benign prostatic hyperplasia) -Continue Vesicare, Flomax    History of prostate cancer -History of radioactive prostate seed implant, patient reports not on any active chemotherapy   DVT prophylaxis: Lovenox  CODE STATUS: Discussed in detail with the patient, requested DNR status  Consults called: None  Admission status: Inpatient, stepdown  The medical decision making on this patient was of high complexity and the patient is at high risk for clinical deterioration, therefore this is a level 3 admission.  Severity of Illness:      The appropriate patient status for this patient is INPATIENT. Inpatient status is judged to be reasonable and necessary in order to provide the required intensity of service to ensure the patient's safety. The patient's presenting symptoms, physical exam findings, and initial radiographic and laboratory data in the context of their chronic comorbidities is felt to place them at high risk for further clinical deterioration. Furthermore, it is not anticipated that the patient will be medically stable for discharge from the hospital within 2 midnights of admission. The following factors support the patient status of inpatient.   " The patient's presenting symptoms include short of breath, hypoxic respiratory failure, on 4 L O2, progressively worsening fever chills nausea, myalgias, weakness " The worrisome physical exam findings include hypoxia, visibly short of breath " The initial radiographic and laboratory data are worrisome because of elevated inflammatory markers for Covid, confirmatory test pending.  Chest x-ray with bilateral pneumonia " The chronic co-morbidities include age, history of childhood asthma, BPH   * I certify that at the point of admission it is my clinical judgment that the patient will require inpatient hospital care spanning beyond 2 midnights from the point of admission due  to high intensity of service, high risk for further deterioration and high frequency of surveillance required.*    Time Spent on Admission: 70 minutes     Katherene Dinino M.D. [facility] 11/20/2019, 2:57 PM

## 2019-11-20 NOTE — ED Provider Notes (Signed)
[facility] DEPT Provider Note   CSN: [id number] Arrival date & time: 11/20/19  1031     History Chief Complaint  Patient presents with  . Shortness of Breath    Jesse [name] is a 86 y.o. male with PMH significant for HLD, malignancy, and recent positive COVID-19 testing who presents to the ED via EMS from [facility] with complaints of persistent, worsening SOB symptoms.  Patient reports that he became symptomatic 11/15/2019 and his positive COVID-19 labs were drawn 11/17/2019 however I cannot find them in the system.  EMS reportedly found him to be hypoxic in the low 80s on RA.  Currently he is on 4 L supplemental oxygen via nasal cannula.  Patient is complaining of worsening shortness of breath symptoms and chills, but denies any headache or dizziness, chest pain or significant cough, abdominal pain, nausea or vomiting, inability to eat or drink, neurologic deficits, or changes in bowel habits.  He reports that he has been eating drinking plenty of Ensure.    HPI     Past Medical History:  Diagnosis Date  . Anxiety   . Asthma    as child  . Bladder cancer [facility]) urologist-  dr Elliot Gault [facility] in [place], [place])   dx 01/ 2016--- post TURBT and post Bladder bx 02-10-2017  . Bladder tumor   . BPH (benign prostatic hyperplasia)   . Frequency of urination   . History of asthma    childhood  . History of external beam radiation therapy    2002  prostate/ pelvis and boost w/ radioactive prostate seed implants   . History of hypercalcemia    s/p  parathyroidectomy 2003  . History of prostate cancer current urologist-  dr Elliot Gault [facility] in [place], [place])-- per lov note (care everywhere) last PSA undetectable   dx 2002--  urologist-- dr dahlstedt/ oncologist dr Valere Dross---  Gleason 7 out of 10, PSA 4.9---post Radioactive Prostate Seed Implant and External Beam Radiation therapy  . History of skin cancer   .  Hyperlipidemia   . Multiple lung nodules    since 2006--- followed by pcp --- dr Maudie Mercury  . Renal cyst, left   . Wears glasses   . Wears hearing aid in both ears     Patient Active Problem List   Diagnosis Date Noted  . Acute respiratory failure due to COVID-19 ([place]) 11/20/2019  . BPH (benign prostatic hyperplasia) 11/20/2019  . History of prostate cancer 11/20/2019  . Acute respiratory failure ([place]) 11/20/2019    Past Surgical History:  Procedure Laterality Date  . CARDIOVASCULAR STRESS TEST  01/01/2004   normal nuclear perfustion study w/ no ischemia/  normal LV function and wall motion, ef 65%  . CATARACT EXTRACTION W/ INTRAOCULAR LENS  IMPLANT, BILATERAL  2010  . COLONOSCOPY    . CYSTO/  BILATERAL RETROGRADE PYELOGRAM/  BLADDER BX'S AND FULGERATION  02-10-2017   dr Anabel Bene [facility] in [place], [place]  . CYSTOSCOPY W/ RETROGRADES Bilateral 07/27/2017   Procedure: CYSTOSCOPY,SELECTIVE CYTOLOGIES,RETROGRADE PYELOGRAM;  Surgeon: Cleon Gustin, MD;  Location: [facility];  Service: Urology;  Laterality: Bilateral;  . CYSTOSCOPY W/ RETROGRADES Bilateral 12/08/2017   Procedure: CYSTOSCOPY WITH RETROGRADE PYELOGRAM;  Surgeon: Cleon Gustin, MD;  Location: [facility];  Service: Urology;  Laterality: Bilateral;  . CYSTOSCOPY WITH BIOPSY N/A 07/27/2017   Procedure: CYSTOSCOPY WITH BIOPSY OF BLADDER AND PROSTATIC URETHRA;  Surgeon: Cleon Gustin, MD;  Location: [facility];  Service: Urology;  Laterality: N/A;  . CYSTOSCOPY WITH BIOPSY Bilateral 12/08/2017   Procedure: CYSTOSCOPY WITH RENAL WASHINGS;  Surgeon: Cleon Gustin, MD;  Location: [facility];  Service: Urology;  Laterality: Bilateral;  . FIBEROPTIC BRONCHOSCOPY  08-20-2005   dr wert   w/ Left lower lobe bx  . PARATHYROIDECTOMY  2003  . RADIOACTIVE PROSTATE SEED IMPLANTS  04-05-2001    dr Diona Fanti [facility]  . TONSILLECTOMY  child  . TRANSURETHRAL RESECTION OF  BLADDER TUMOR  11-24-2014   dr Anabel Bene [facility] in [place], [place]  . TRANSURETHRAL RESECTION OF BLADDER TUMOR N/A 04/08/2018   Procedure: TRANSURETHRAL RESECTION OF BLADDER TUMOR (TURBT);  Surgeon: Cleon Gustin, MD;  Location: [facility];  Service: Urology;  Laterality: N/A;  . TRANSURETHRAL RESECTION OF PROSTATE N/A 02/28/2019   Procedure: TRANSURETHRAL RESECTION OF THE PROSTATE (TURP);  Surgeon: Cleon Gustin, MD;  Location: [facility];  Service: Urology;  Laterality: N/A;  30 MINS  . UPPER GI ENDOSCOPY         No family history on file.  Social History   Tobacco Use  . Smoking status: Former Smoker    Years: 50.00    Types: Cigarettes    Quit date: 11/03/1984    Years since quitting: 35.0  . Smokeless tobacco: Never Used  Substance Use Topics  . Alcohol use: Yes    Alcohol/week: 7.0 standard drinks    Types: 7 Glasses of wine per week    Comment: daily wine  . Drug use: No    Home Medications Prior to Admission medications   Medication Sig Start Date End Date Taking? Authorizing Provider  acetaminophen (TYLENOL) 325 MG tablet Take 325 mg by mouth every 6 (six) hours as needed (for pain).    [provider]  chlordiazePOXIDE (LIBRIUM) 10 MG capsule Take 10 mg by mouth daily. In the morning    [provider]  Cholecalciferol (VITAMIN D3) 1000 units CAPS Take 1,000 Units by mouth daily.    [provider]  HYDROcodone-acetaminophen (NORCO) 5-325 MG tablet Take 1 tablet by mouth every 6 (six) hours as needed for moderate pain. 02/28/19   McKenzie, Candee Furbish, MD  Multiple Vitamins-Minerals (PRESERVISION AREDS 2) CAPS Take 1 capsule by mouth 2 (two) times daily.    [provider]  nystatin cream (MYCOSTATIN) Apply 1 application topically 2 (two) times daily as needed for dry skin (itching.).    [provider]  pravastatin (PRAVACHOL) 40 MG tablet Take 40 mg by mouth daily. In the  morning. 09/30/14   [provider]  solifenacin (VESICARE) 10 MG tablet Take 10 mg by mouth daily. In the morning.    [provider]  tamsulosin (FLOMAX) 0.4 MG CAPS capsule Take 0.4 mg by mouth 2 (two) times a day.     [provider]  thiamine 100 MG tablet Take 100 mg by mouth daily. In the morning. Vitamin B-1    [provider]    Allergies    Adhesive [tape]  Review of Systems   Review of Systems  All other systems reviewed and are negative.   Physical Exam Updated Vital Signs BP 116/78   Pulse (!) 101   Temp 97.9 F (36.6 C) (Oral)   Resp (!) 29   Ht 5\' 7"  (1.702 m)   Wt 58.1 kg   SpO2 96%   BMI 20.05 kg/m   Physical Exam Vitals and  nursing note reviewed. Exam conducted with a chaperone present.  Constitutional:      Appearance: Normal appearance. He is ill-appearing.  HENT:     Head: Normocephalic and atraumatic.  Eyes:     General: No scleral icterus.    Conjunctiva/sclera: Conjunctivae normal.  Cardiovascular:     Rate and Rhythm: Normal rate and regular rhythm.     Pulses: Normal pulses.     Heart sounds: Normal heart sounds.  Pulmonary:     Comments: Increased respiratory effort.  Breath sounds intact bilaterally.  Rales appreciated.  No accessory muscle use. Abdominal:     General: Abdomen is flat. There is no distension.     Palpations: Abdomen is soft.     Tenderness: There is no abdominal tenderness. There is no guarding.  Musculoskeletal:        General: Normal range of motion.  Skin:    General: Skin is dry.     Capillary Refill: Capillary refill takes less than 2 seconds.  Neurological:     Mental Status: He is alert and oriented to person, place, and time.     GCS: GCS eye subscore is 4. GCS verbal subscore is 5. GCS motor subscore is 6.     Cranial Nerves: No cranial nerve deficit.     Sensory: No sensory deficit.     Motor: No weakness.     Coordination: Coordination normal.     Gait: Gait normal.   Psychiatric:        Mood and Affect: Mood normal.        Behavior: Behavior normal.        Thought Content: Thought content normal.     ED Results / Procedures / Treatments   Labs (all labs ordered are listed, but only abnormal results are displayed) Labs Reviewed  CBC WITH DIFFERENTIAL/PLATELET - Abnormal; Notable for the following components:      Result Value   WBC 11.6 (*)    Neutro Abs 8.5 (*)    Eosinophils Absolute 0.7 (*)    All other components within normal limits  COMPREHENSIVE METABOLIC PANEL - Abnormal; Notable for the following components:   Potassium 3.4 (*)    Glucose, Bld 112 (*)    BUN 44 (*)    Creatinine, Ser 1.28 (*)    Calcium 8.4 (*)    Albumin 3.4 (*)    AST 57 (*)    ALT 54 (*)    GFR calc non Af Amer 51 (*)    GFR calc Af Amer 59 (*)    All other components within normal limits  D-DIMER, QUANTITATIVE (NOT AT [facility]) - Abnormal; Notable for the following components:   D-Dimer, Quant 1.46 (*)    All other components within normal limits  LACTATE DEHYDROGENASE - Abnormal; Notable for the following components:   LDH 269 (*)    All other components within normal limits  FERRITIN - Abnormal; Notable for the following components:   Ferritin 706 (*)    All other components within normal limits  FIBRINOGEN - Abnormal; Notable for the following components:   Fibrinogen >800 (*)    All other components within normal limits  C-REACTIVE PROTEIN - Abnormal; Notable for the following components:   CRP 25.2 (*)    All other components within normal limits  CULTURE, BLOOD (ROUTINE X 2)  CULTURE, BLOOD (ROUTINE X 2)  SARS CORONAVIRUS 2 (TAT 6-24 HRS)  LACTIC ACID, PLASMA  TRIGLYCERIDES  LACTIC ACID, PLASMA  PROCALCITONIN  POC SARS CORONAVIRUS 2 AG -  ED    EKG EKG Interpretation  Date/Time:  Sunday November 20 2019 10:55:19 EST Ventricular Rate:  94 PR Interval:    QRS Duration: 101 QT Interval:  362 QTC Calculation: 453 R Axis:   78 Text  Interpretation: Sinus rhythm LAE, consider biatrial enlargement Confirmed by Veryl Speak [phone]) on 11/20/2019 2:01:18 PM   Radiology DG Chest Port 1 View  Result Date: 11/20/2019 CLINICAL DATA:  Shortness of breath. EXAM: PORTABLE CHEST 1 VIEW COMPARISON:  August 20, 2005. FINDINGS: The heart size and mediastinal contours are within normal limits. No pneumothorax or pleural effusion is noted. Faint bibasilar opacities are noted concerning for subsegmental atelectasis or possibly infiltrate. The visualized skeletal structures are unremarkable. IMPRESSION: Faint bibasilar opacities are noted concerning for subsegmental atelectasis or possibly infiltrate. Electronically Signed   By: Marijo Conception M.D.   On: 11/20/2019 13:17    Procedures Procedures (including critical care time)  Medications Ordered in ED Medications - No data to display  ED Course  I have reviewed the triage vital signs and the nursing notes.  Pertinent labs & imaging results that were available during my care of the patient were reviewed by me and considered in my medical decision making (see chart for details).    MDM Rules/Calculators/A&P                      Patient reports that he received a phone call this morning from the clinic at which he received COVID-19 testing, confirming his positive diagnosis.  He reports that it was a [facility] urgent care clinic on [address].  Per EMS, he was saturating in the low 80s on RA and was subsequently placed on 4 L supplemental oxygen.  He reports that he does not typically require supplemental oxygen at home.  He reports that he is typically able to ambulate without any assistive walking devices.  I removed the 4 L supplemental oxygen temporarily to observe his oxygenation, and it dipped to 88% with an associated increased work of breathing and tachypnea to 33.    Will obtain preadmission labs and will consult hospitalist for admission due to new oxygen requirements  and hypoxia in the setting of COVID-19 infection.  Spoke with Dr. Tana Coast, [facility] hospitalist, who will admit patient.   Final Clinical Impression(s) / ED Diagnoses Final diagnoses:  [id number]    Rx / DC Orders ED Discharge Orders    None       Corena Herter, PA-C 11/20/19 1414    Veryl Speak, MD 11/20/19 1445

## 2019-11-20 NOTE — ED Notes (Signed)
ED TO INPATIENT HANDOFF REPORT  ED Nurse Name and Phone #:  Ria Comment, RN  S Name/Age/Gender Jesse [name] 86 y.o. male Room/Bed: WA14/WA14  Code Status   Code Status: DNR  Home/SNF/Other Home Patient oriented to: self, place, time and situation Is this baseline? Yes   Triage Complete: Triage complete  Chief Complaint Acute respiratory failure ([place]) [J96.00]  Triage Note He c/o persistent and worsening shortness of breath. He has tested positive (where?) for COVID. EMS found him to have an SPO2 in the low 80's on rm. Air. His oxygenation was found to be sufficient at 4 l.p.m. he arrives awake, alert and in no distress.    Allergies Allergies  Allergen Reactions  . Adhesive [Tape] Other (See Comments)    Tears skin off PAPER TAPE IS OKAY    Level of Care/Admitting Diagnosis ED Disposition    ED Disposition Condition Comment   Admit  Hospital Area: [facility] [[id number]]  Level of Care: Stepdown [14]  Admit to SDU based on following criteria: Respiratory Distress:  Frequent assessment and/or intervention to maintain adequate ventilation/respiration, pulmonary toilet, and respiratory treatment.  Covid Evaluation: Symptomatic Person Under Investigation (PUI)  Diagnosis: Acute respiratory failure ([place]) [518.81.ICD-9-CM]  Admitting Physician: RAI, [place] K [[id number]]  Attending Physician: RAI, RIPUDEEP K [[id number]]  Estimated length of stay: past midnight tomorrow  Certification:: I certify this patient will need inpatient services for at least 2 midnights       B Medical/Surgery History Past Medical History:  Diagnosis Date  . Anxiety   . Asthma    as child  . Bladder cancer [facility]) urologist-  dr Elliot Gault [facility] in [place], [place])   dx 01/ 2016--- post TURBT and post Bladder bx 02-10-2017  . Bladder tumor   . BPH (benign prostatic hyperplasia)   . Frequency of urination   . History of asthma    childhood  . History of  external beam radiation therapy    2002  prostate/ pelvis and boost w/ radioactive prostate seed implants   . History of hypercalcemia    s/p  parathyroidectomy 2003  . History of prostate cancer current urologist-  dr Elliot Gault [facility] in [place], [place])-- per lov note (care everywhere) last PSA undetectable   dx 2002--  urologist-- dr dahlstedt/ oncologist dr Valere Dross---  Gleason 7 out of 10, PSA 4.9---post Radioactive Prostate Seed Implant and External Beam Radiation therapy  . History of skin cancer   . Hyperlipidemia   . Multiple lung nodules    since 2006--- followed by pcp --- dr Maudie Mercury  . Renal cyst, left   . Wears glasses   . Wears hearing aid in both ears    Past Surgical History:  Procedure Laterality Date  . CARDIOVASCULAR STRESS TEST  01/01/2004   normal nuclear perfustion study w/ no ischemia/  normal LV function and wall motion, ef 65%  . CATARACT EXTRACTION W/ INTRAOCULAR LENS  IMPLANT, BILATERAL  2010  . COLONOSCOPY    . CYSTO/  BILATERAL RETROGRADE PYELOGRAM/  BLADDER BX'S AND FULGERATION  02-10-2017   dr Anabel Bene [facility] in [place], [place]  . CYSTOSCOPY W/ RETROGRADES Bilateral 07/27/2017   Procedure: CYSTOSCOPY,SELECTIVE CYTOLOGIES,RETROGRADE PYELOGRAM;  Surgeon: Cleon Gustin, MD;  Location: [facility];  Service: Urology;  Laterality: Bilateral;  . CYSTOSCOPY W/ RETROGRADES Bilateral 12/08/2017   Procedure: CYSTOSCOPY WITH RETROGRADE PYELOGRAM;  Surgeon: Cleon Gustin, MD;  Location: [facility];  Service: Urology;  Laterality:  Bilateral;  . CYSTOSCOPY WITH BIOPSY N/A 07/27/2017   Procedure: CYSTOSCOPY WITH BIOPSY OF BLADDER AND PROSTATIC URETHRA;  Surgeon: Cleon Gustin, MD;  Location: [facility];  Service: Urology;  Laterality: N/A;  . CYSTOSCOPY WITH BIOPSY Bilateral 12/08/2017   Procedure: CYSTOSCOPY WITH RENAL WASHINGS;  Surgeon: Cleon Gustin, MD;  Location: [facility];   Service: Urology;  Laterality: Bilateral;  . FIBEROPTIC BRONCHOSCOPY  08-20-2005   dr wert   w/ Left lower lobe bx  . PARATHYROIDECTOMY  2003  . RADIOACTIVE PROSTATE SEED IMPLANTS  04-05-2001    dr Diona Fanti [facility]  . TONSILLECTOMY  child  . TRANSURETHRAL RESECTION OF BLADDER TUMOR  11-24-2014   dr Anabel Bene [facility] in [place], [place]  . TRANSURETHRAL RESECTION OF BLADDER TUMOR N/A 04/08/2018   Procedure: TRANSURETHRAL RESECTION OF BLADDER TUMOR (TURBT);  Surgeon: Cleon Gustin, MD;  Location: [facility];  Service: Urology;  Laterality: N/A;  . TRANSURETHRAL RESECTION OF PROSTATE N/A 02/28/2019   Procedure: TRANSURETHRAL RESECTION OF THE PROSTATE (TURP);  Surgeon: Cleon Gustin, MD;  Location: [facility];  Service: Urology;  Laterality: N/A;  30 MINS  . UPPER GI ENDOSCOPY       A IV Location/Drains/Wounds Patient Lines/Drains/Airways Status   Active Line/Drains/Airways    Name:   Placement date:   Placement time:   Site:   Days:   Peripheral IV 11/20/19 Right;Lateral Forearm   11/20/19    1203    Forearm   less than 1   Peripheral IV 11/20/19 Left;Lateral Forearm   11/20/19    1204    Forearm   less than 1          Intake/Output Last 24 hours No intake or output data in the 24 hours ending 11/20/19 2306  Labs/Imaging Results for orders placed or performed during the hospital encounter of 11/20/19 (from the past 48 hour(s))  Lactic acid, plasma     Status: None   Collection Time: 11/20/19 12:02 PM  Result Value [facility]   Lactic Acid, Venous 1.4 0.5 - 1.9 mmol/L    Comment: Performed at [facility], [place] [address], [place], [place] [address]  CBC WITH DIFFERENTIAL     Status: Abnormal   Collection Time: 11/20/19 12:02 PM  Result Value Ref [facility]   WBC 11.6 (H) 4.0 - 10.5 K/uL    Comment: WHITE COUNT CONFIRMED ON SMEAR   RBC 4.50 4.22 - 5.81 MIL/uL   Hemoglobin 14.1 13.0 - 17.0 g/dL   HCT 42.3 39.0 - 52.0 %    MCV 94.0 80.0 - 100.0 fL   MCH 31.3 26.0 - 34.0 pg   MCHC 33.3 30.0 - 36.0 g/dL   RDW 13.8 11.5 - 15.5 %   Platelets 233 150 - 400 K/uL   nRBC 0.0 0.0 - 0.2 %   Neutrophils Relative % 73 %   Neutro Abs 8.5 (H) 1.7 - 7.7 K/uL   Lymphocytes Relative 11 %   Lymphs Abs 1.2 0.7 - 4.0 K/uL   Monocytes Relative 9 %   Monocytes Absolute 1.0 0.1 - 1.0 K/uL   Eosinophils Relative 6 %   Eosinophils Absolute 0.7 (H) 0.0 - 0.5 K/uL   Basophils Relative 0 %   Basophils Absolute 0.0 0.0 - 0.1 K/uL   WBC Morphology MILD LEFT SHIFT (1-5% METAS, OCC MYELO, OCC BANDS)    Immature Granulocytes 1 %   Abs Immature Granulocytes 0.06 0.00 -  0.07 K/uL    Comment: Performed at [facility], [place] [address] [place], [place] [address]  Comprehensive metabolic panel     Status: Abnormal   Collection Time: 11/20/19 12:02 PM  Result Value Ref [facility]   Sodium 137 135 - 145 mmol/L   Potassium 3.4 (L) 3.5 - 5.1 mmol/L   Chloride 99 98 - 111 mmol/L   CO2 26 22 - 32 mmol/L   Glucose, Bld 112 (H) 70 - 99 mg/dL   BUN 44 (H) 8 - 23 mg/dL   Creatinine, Ser 1.28 (H) 0.61 - 1.24 mg/dL   Calcium 8.4 (L) 8.9 - 10.3 mg/dL   Total Protein 7.1 6.5 - 8.1 g/dL   Albumin 3.4 (L) 3.5 - 5.0 g/dL   AST 57 (H) 15 - 41 U/L   ALT 54 (H) 0 - 44 U/L   Alkaline Phosphatase 60 38 - 126 U/L   Total Bilirubin 1.0 0.3 - 1.2 mg/dL   GFR calc non Af Amer 51 (L) >60 mL/min   GFR calc Af Amer 59 (L) >60 mL/min   Anion gap 12 5 - 15    Comment: Performed at [facility], [place] [address] [place], [place] [address]  D-dimer, quantitative     Status: Abnormal   Collection Time: 11/20/19 12:02 PM  Result Value Ref [facility]   D-Dimer, Quant 1.46 (H) 0.00 - 0.50 ug/mL-FEU    Comment: (NOTE) At the manufacturer cut-off of 0.50 ug/mL FEU, this assay has been documented to exclude PE with a sensitivity and negative predictive value of 97 to 99%.  At this time, this assay has not been approved by the FDA to  exclude DVT/VTE. Results should be correlated with clinical presentation. Performed at [facility], [place] [address], [place], [place] [address]   Procalcitonin     Status: None   Collection Time: 11/20/19 12:02 PM  Result Value Ref [facility]   Procalcitonin 1.86 ng/mL    Comment:        Interpretation: PCT > 0.5 ng/mL and <= 2 ng/mL: Systemic infection (sepsis) is possible, but other conditions are known to elevate PCT as well. (NOTE)       Sepsis PCT Algorithm           Lower Respiratory Tract                                      Infection PCT Algorithm    ----------------------------     ----------------------------         PCT < 0.25 ng/mL                PCT < 0.10 ng/mL         Strongly encourage             Strongly discourage   discontinuation of antibiotics    initiation of antibiotics    ----------------------------     -----------------------------       PCT 0.25 - 0.50 ng/mL            PCT 0.10 - 0.25 ng/mL               OR       >80% decrease in PCT            Discourage initiation of  antibiotics      Encourage discontinuation           of antibiotics    ----------------------------     -----------------------------         PCT >= 0.50 ng/mL              PCT 0.26 - 0.50 ng/mL                AND       <80% decrease in PCT             Encourage initiation of                                             antibiotics       Encourage continuation           of antibiotics    ----------------------------     -----------------------------        PCT >= 0.50 ng/mL                  PCT > 0.50 ng/mL               AND         increase in PCT                  Strongly encourage                                      initiation of antibiotics    Strongly encourage escalation           of antibiotics                                     -----------------------------                                           PCT <= 0.25  ng/mL                                                 OR                                        > 80% decrease in PCT                                     Discontinue / Do not initiate                                             antibiotics Performed at [place] [address], [place], [place] [address]   Lactate dehydrogenase     Status: Abnormal   Collection Time: 11/20/19  12:02 PM  Result Value [facility]   LDH 269 (H) 98 - 192 U/L    Comment: Performed at [facility], [place] [address] [place], [place] [address]  Fibrinogen     Status: Abnormal   Collection Time: 11/20/19 12:02 PM  Result Value [facility]   Fibrinogen >800 (H) 210 - 475 mg/dL    Comment: Performed at [facility], [place] [address] [place], [place] [address]  Ferritin     Status: Abnormal   Collection Time: 11/20/19 12:03 PM  Result Value Ref [facility]   Ferritin 706 (H) 24 - 336 ng/mL    Comment: Performed at [facility], [place] [address] [place], [place] [address]  Triglycerides     Status: None   Collection Time: 11/20/19 12:03 PM  Result Value [facility]   Triglycerides 91 <150 mg/dL    Comment: Performed at [facility], [place] [address], [place], [place] [address]  C-reactive protein     Status: Abnormal   Collection Time: 11/20/19 12:03 PM  Result Value [facility]   CRP 25.2 (H) <1.0 mg/dL    Comment: Performed at [facility], [place] [address] [place], [place] [address]  POC SARS Coronavirus 2 Ag-ED - Nasal Swab (BD Veritor Kit)     Status: None   Collection Time: 11/20/19 12:27 PM  Result Value Ref [facility]   SARS Coronavirus 2 Ag NEGATIVE NEGATIVE    Comment: (NOTE) SARS-CoV-2 antigen NOT DETECTED.  Negative results are presumptive.  Negative results do not preclude SARS-CoV-2 infection and should not be used as the sole basis for treatment or other patient management decisions, including  infection  control decisions, particularly in the presence of clinical signs and  symptoms consistent with COVID-19, or in those who have been in contact with the virus.  Negative results must be combined with clinical observations, patient history, and epidemiological information. The expected result is Negative. Fact Sheet for Patients: PodPark.tn Fact Sheet for Healthcare Providers: GiftContent.is This test is not yet approved or cleared by the [place] FDA and  has been authorized for detection and/or diagnosis of SARS-CoV-2 by FDA under an Emergency Use Authorization (EUA).  This EUA will remain in effect (meaning this test can be used) for the duration of  the COVID-19 de claration under Section 564(b)(1) of the Act, 21 U.S.C. section 360bbb-3(b)(1), unless the authorization is terminated or revoked sooner.   Lactic acid, plasma     Status: None   Collection Time: 11/20/19  1:28 PM  Result Value Ref [facility]   Lactic Acid, Venous 1.6 0.5 - 1.9 mmol/L    Comment: Performed at [facility], [place] [address], [place], [place] [address]  CBC     Status: Abnormal   Collection Time: 11/20/19  9:42 PM  Result Value [facility]   WBC 12.9 (H) 4.0 - 10.5 K/uL   RBC 4.31 4.22 - 5.81 MIL/uL   Hemoglobin 13.7 13.0 - 17.0 g/dL   HCT 40.1 39.0 - 52.0 %   MCV 93.0 80.0 - 100.0 fL   MCH 31.8 26.0 - 34.0 pg   MCHC 34.2 30.0 - 36.0 g/dL   RDW 13.9 11.5 - 15.5 %   Platelets 225 150 - 400 K/uL   nRBC 0.0 0.0 - 0.2 %    Comment: Performed at [facility], [place] [address] [place] [address], [place], [place] [address]  Creatinine, serum     Status: None  Collection Time: 11/20/19  9:42 PM  Result Value Ref [facility]   Creatinine, Ser 1.10 0.61 - 1.24 mg/dL   GFR calc non Af Amer >60 >60 mL/min   GFR calc Af Amer >60 >60 mL/min    Comment: Performed at [facility], [place] [address] [place], [place]  [address]   DG Chest Port 1 View  Result Date: 11/20/2019 CLINICAL DATA:  Shortness of breath. EXAM: PORTABLE CHEST 1 VIEW COMPARISON:  August 20, 2005. FINDINGS: The heart size and mediastinal contours are within normal limits. No pneumothorax or pleural effusion is noted. Faint bibasilar opacities are noted concerning for subsegmental atelectasis or possibly infiltrate. The visualized skeletal structures are unremarkable. IMPRESSION: Faint bibasilar opacities are noted concerning for subsegmental atelectasis or possibly infiltrate. Electronically Signed   By: Marijo Conception M.D.   On: 11/20/2019 13:17    Pending Labs Unresulted Labs (From admission, onward)    Start     Ordered   11/27/19 0500  Creatinine, serum  (enoxaparin (LOVENOX)    CrCl >/= 30 ml/min)  Weekly,   R    Comments: while on enoxaparin therapy    11/20/19 2025   11/21/19 0500  CBC with Differential/Platelet  Daily,   R     11/20/19 2025   11/21/19 0500  C-reactive protein  Daily,   R     11/20/19 2025   11/21/19 0500  D-dimer, quantitative (not at [facility])  Daily,   R     11/20/19 2025   11/21/19 0500  Ferritin  Daily,   R     11/20/19 2025   11/20/19 2025  ABO/Rh  Once,   STAT     11/20/19 2025   11/20/19 1237  SARS CORONAVIRUS 2 (TAT 6-24 HRS) Nasopharyngeal Nasopharyngeal Swab  (Tier 3 (TAT 6-24 hrs))  Once,   STAT    Question Answer Comment  Is this test for diagnosis or screening Diagnosis of ill patient   Symptomatic for COVID-19 as defined by CDC Yes   Date of Symptom Onset 11/15/2019   Hospitalized for COVID-19 No   Admitted to ICU for COVID-19 No   Previously tested for COVID-19 Yes   Resident in a congregate (group) care setting No   Employed in healthcare setting No      11/20/19 1237   11/20/19 1132  Blood Culture (routine x 2)  BLOOD CULTURE X 2,   STAT     11/20/19 1132          Vitals/Pain Today's Vitals   11/20/19 2000 11/20/19 2030 11/20/19 2100 11/20/19 2130  BP: (!) 120/56 (!) 116/59  118/61 104/65  Pulse: 85 80 79 85  Resp: (!) 25 (!) 22 (!) 24 17  Temp:      TempSrc:      SpO2: 94% 92% 93% 90%  Weight:      Height:      PainSc:        Isolation Precautions Airborne and Contact precautions  Medications Medications  dexamethasone (DECADRON) injection 6 mg (6 mg Intravenous Given 11/20/19 1543)  albuterol (VENTOLIN HFA) 108 (90 Base) MCG/ACT inhaler 2 puff (2 puffs Inhalation Given 11/20/19 1543)  remdesivir 200 mg in sodium chloride 0.9% 250 mL IVPB (0 mg Intravenous Stopped 11/20/19 1652)    Followed by  remdesivir 100 mg in sodium chloride 0.9 % 100 mL IVPB (has no administration in time range)  pravastatin (PRAVACHOL) tablet 40 mg (has no administration in time range)  darifenacin (  ENABLEX) 24 hr tablet 15 mg (has no administration in time range)  tamsulosin (FLOMAX) capsule 0.4 mg (0.4 mg Oral Given 11/20/19 2141)  cholecalciferol (VITAMIN D) tablet 1,000 Units (has no administration in time range)  multivitamin (PROSIGHT) tablet 1 tablet (1 tablet Oral Given 11/20/19 2140)  thiamine tablet 100 mg (has no administration in time range)  enoxaparin (LOVENOX) injection 40 mg (40 mg Subcutaneous Given 11/20/19 2141)  albuterol (VENTOLIN HFA) 108 (90 Base) MCG/ACT inhaler 2 puff (has no administration in time range)  dexamethasone (DECADRON) injection 6 mg (6 mg Intravenous Given 11/20/19 2140)  chlorpheniramine-HYDROcodone (TUSSIONEX) 10-8 MG/5ML suspension 5 mL (has no administration in time range)  ascorbic acid (VITAMIN C) tablet 500 mg (has no administration in time range)  zinc sulfate capsule 220 mg (has no administration in time range)  acetaminophen (TYLENOL) tablet 650 mg (has no administration in time range)  acetaminophen (TYLENOL) tablet 650 mg (650 mg Oral Given 11/20/19 1652)    Mobility walks Low fall risk   Focused Assessments    R Recommendations: See Admitting Provider Note  Report given to:   Additional Notes:

## 2019-11-21 ENCOUNTER — Other Ambulatory Visit: Payer: Self-pay

## 2019-11-21 ENCOUNTER — Encounter (HOSPITAL_COMMUNITY): Payer: Self-pay | Admitting: Internal Medicine

## 2019-11-21 LAB — CBC WITH DIFFERENTIAL/PLATELET
Abs Immature Granulocytes: 0.06 10*3/uL (ref 0.00–0.07)
Basophils Absolute: 0 10*3/uL (ref 0.0–0.1)
Basophils Relative: 0 %
Eosinophils Absolute: 0 10*3/uL (ref 0.0–0.5)
Eosinophils Relative: 0 %
HCT: 42.7 % (ref 39.0–52.0)
Hemoglobin: 14.2 g/dL (ref 13.0–17.0)
Immature Granulocytes: 1 %
Lymphocytes Relative: 6 %
Lymphs Abs: 0.7 10*3/uL (ref 0.7–4.0)
MCH: 31.3 pg (ref 26.0–34.0)
MCHC: 33.3 g/dL (ref 30.0–36.0)
MCV: 94.3 fL (ref 80.0–100.0)
Monocytes Absolute: 0.3 10*3/uL (ref 0.1–1.0)
Monocytes Relative: 3 %
Neutro Abs: 10.1 10*3/uL — ABNORMAL HIGH (ref 1.7–7.7)
Neutrophils Relative %: 90 %
Platelets: 242 10*3/uL (ref 150–400)
RBC: 4.53 MIL/uL (ref 4.22–5.81)
RDW: 13.9 % (ref 11.5–15.5)
WBC: 11.2 10*3/uL — ABNORMAL HIGH (ref 4.0–10.5)
nRBC: 0 % (ref 0.0–0.2)

## 2019-11-21 LAB — ABO/RH: ABO/RH(D): O POS

## 2019-11-21 LAB — GLUCOSE, CAPILLARY: Glucose-Capillary: 132 mg/dL — ABNORMAL HIGH (ref 70–99)

## 2019-11-21 LAB — MRSA PCR SCREENING: MRSA by PCR: POSITIVE — AB

## 2019-11-21 LAB — FERRITIN: Ferritin: 875 ng/mL — ABNORMAL HIGH (ref 24–336)

## 2019-11-21 LAB — SARS CORONAVIRUS 2 (TAT 6-24 HRS): SARS Coronavirus 2: POSITIVE — AB

## 2019-11-21 LAB — C-REACTIVE PROTEIN: CRP: 24.6 mg/dL — ABNORMAL HIGH (ref <1.0)

## 2019-11-21 LAB — D-DIMER, QUANTITATIVE: D-Dimer, Quant: 1.38 ug/mL-FEU — ABNORMAL HIGH (ref 0.00–0.50)

## 2019-11-21 MED ORDER — CHLORDIAZEPOXIDE HCL 5 MG PO CAPS
10.0000 mg | ORAL_CAPSULE | Freq: Every day | ORAL | Status: DC
  Administered 2019-11-21 – 2019-11-25 (×5): 10 mg via ORAL
  Filled 2019-11-21 (×5): qty 2

## 2019-11-21 MED ORDER — DEXAMETHASONE SODIUM PHOSPHATE 10 MG/ML IJ SOLN: 10.0000 mg | INTRAMUSCULAR | Status: DC

## 2019-11-21 MED ORDER — SODIUM CHLORIDE 0.9 % IV SOLN: 500.0000 mg | INTRAVENOUS | Status: DC

## 2019-11-21 MED ORDER — VITAMIN B-12 1000 MCG PO TABS
1000.0000 ug | ORAL_TABLET | Freq: Every day | ORAL | Status: DC
  Administered 2019-11-22 – 2019-11-25 (×4): 1000 ug via ORAL
  Filled 2019-11-21 (×4): qty 1

## 2019-11-21 MED ORDER — HYDROCODONE-ACETAMINOPHEN 5-325 MG PO TABS: 1.0000 | ORAL_TABLET | Freq: Four times a day (QID) | ORAL | Status: DC | PRN

## 2019-11-21 MED ORDER — SODIUM CHLORIDE 0.9% IV SOLUTION: Freq: Once | INTRAVENOUS | Status: DC

## 2019-11-21 MED ORDER — CHLORHEXIDINE GLUCONATE CLOTH 2 % EX PADS
6.0000 | MEDICATED_PAD | Freq: Every day | CUTANEOUS | Status: DC
  Administered 2019-11-21 – 2019-11-25 (×5): 6 via TOPICAL

## 2019-11-21 MED ORDER — TOCILIZUMAB 400 MG/20ML IV SOLN
440.0000 mg | Freq: Once | INTRAVENOUS | Status: AC
  Administered 2019-11-21: 440 mg via INTRAVENOUS
  Filled 2019-11-21: qty 20

## 2019-11-21 MED ORDER — MUPIROCIN 2 % EX OINT
TOPICAL_OINTMENT | Freq: Two times a day (BID) | CUTANEOUS | Status: DC
  Administered 2019-11-21 – 2019-11-24 (×4): 1 via NASAL
  Filled 2019-11-21: qty 22

## 2019-11-21 MED ORDER — SODIUM CHLORIDE 0.9 % IV SOLN: 2.0000 g | INTRAVENOUS | Status: DC

## 2019-11-21 MED ORDER — ALBUTEROL SULFATE HFA 108 (90 BASE) MCG/ACT IN AERS
2.0000 | INHALATION_SPRAY | RESPIRATORY_TRACT | Status: DC | PRN
  Administered 2019-11-25: 2 via RESPIRATORY_TRACT

## 2019-11-21 NOTE — Progress Notes (Signed)
[facility]                                                                              Patient Demographics  Jesse [name], is a 86 y.o. male, DOB - [birth year], QC:[id number]  Admit date - 11/20/2019   Admitting Physician Natha Guin Krystal Eaton, MD  Outpatient Primary MD for the patient is Jani Gravel, MD  Outpatient specialists:   LOS - 1  days   Medical records reviewed and are as summarized below:    Chief Complaint  Patient presents with  . Shortness of Breath       Brief summary   Patient is a 86 year old male with history of anxiety, BPH, bladder CA post radioactive prostate seed implant, XRT presented to ED for worsening shortness of breath.  Patient reported that he and his wife had tested positive for Covid on 1/14.  Patient reported that his symptoms started on last Tuesday on 11/15/2019, initially with fevers and chills, loss of appetite, fatigue generalized weakness.  Patient reported that his symptoms are progressively worsening and now felt more short of breath.   Patient and his wife are brought to ED today.  EMS noted his O2 sats in low 80s on room air. At the time of my examination, patient is visibly short of breath, O2 sats 91% on 4 L.  Alert and oriented.  At baseline, not dependent on O2.  ED work-up/course:  Temp 97.9, RR 24, heart rate 91, BP 127/70, O2 sats 91% on 4 L during my examination. Point-of-care Covid test negative however confirmatory test pending Unable to find patient's Covid positive test that was done outpatient  COVID-19 test positive   Assessment & Plan    Acute hypoxic respiratory failure due to acute COVID-19 viral pneumonia during the ongoing COVID-19 pandemic- POA - Patient presented with hypoxia, shortness of breath, progressively worsening fatigue, myalgias, nausea, poor appetite fevers and chills.  Chest x-ray showed subsegmental atelectasis or possible infiltrates -Overnight hypoxia worse, noted to be on 15 L  O2 NRB at the time of my encounter. -Increase Decadron to 10 mg daily, continue  Remdesivir per pharmacy protocol -Procalcitonin low, stopped IV Zithromax, Rocephin  -Status post Actemra x1, transfuse convalescent plasma if available - Continue Supportive care: vitamin C/zinc, albuterol, Tylenol. - Continue to wean oxygen, ambulatory O2 screening daily as tolerated  - Oxygen - SpO2: 96 % O2 Flow Rate (L/min): 11 L/min - Continue to follow labs as below  Lab Results  Component Value Date   SARSCOV2NAA POSITIVE (A) 11/20/2019     Recent Labs  Lab 11/20/19 1202 11/20/19 1203 11/21/19 0219  DDIMER 1.46*  --  1.38*  FERRITIN  --  706* 875*  CRP  --  25.2* 24.6*  ALT 54*  --   --   PROCALCITON 1.86  --   --     Active Problems:   BPH (benign prostatic hyperplasia) -Continue Vesicare, Flomax -No acute issues    History of prostate cancer -History of radioactive prostate seed implant, patient reports not on any active chemotherapy   Moderate protein calorie malnutrition BMI 18.9 Dietitian consult  Code Status:  DNR status DVT Prophylaxis: Lovenox Family Communication: Discussed in detail with the patient, agreeable to transfer to [facility].  Called patient's son but unable to make contact.   Disposition Plan: Pending clinical status, currently inpatient given acute hypoxic respiratory failure, acute COVID-19 illness.  Once ambulatory without overt symptoms or hypoxia would consider discharge home.  Transfer to [facility] when bed available    Time Spent in minutes 35 minutes  Procedures:  None  Consultants:   None  Antimicrobials:   Anti-infectives (From admission, onward)   Start     Dose/Rate Route Frequency Ordered Stop   11/21/19 1215  azithromycin (ZITHROMAX) 500 mg in sodium chloride 0.9 % 250 mL IVPB  Status:  Discontinued     500 mg 250 mL/hr over 60 Minutes Intravenous Every 24 hours 11/21/19 1205 11/21/19 1220   11/21/19 1215  cefTRIAXone (ROCEPHIN) 2 g in sodium  chloride 0.9 % 100 mL IVPB  Status:  Discontinued     2 g 200 mL/hr over 30 Minutes Intravenous Every 24 hours 11/21/19 1205 11/21/19 1220   11/21/19 1000  remdesivir 100 mg in sodium chloride 0.9 % 100 mL IVPB     100 mg 200 mL/hr over 30 Minutes Intravenous Daily 11/20/19 1447 11/25/19 0959   11/21/19 1000  remdesivir 100 mg in sodium chloride 0.9 % 100 mL IVPB  Status:  Discontinued     100 mg 200 mL/hr over 30 Minutes Intravenous Daily 11/20/19 2025 11/20/19 2036   11/20/19 2024  remdesivir 200 mg in sodium chloride 0.9% 250 mL IVPB  Status:  Discontinued     200 mg 580 mL/hr over 30 Minutes Intravenous Once 11/20/19 2025 11/20/19 2036   11/20/19 1600  remdesivir 200 mg in sodium chloride 0.9% 250 mL IVPB     200 mg 580 mL/hr over 30 Minutes Intravenous Once 11/20/19 1447 11/20/19 1652          Medications  Scheduled Meds: . sodium chloride   Intravenous Once  . albuterol  2 puff Inhalation Q6H  . vitamin C  500 mg Oral Daily  . Chlorhexidine Gluconate Cloth  6 each Topical Daily  . cholecalciferol  1,000 Units Oral Daily  . darifenacin  15 mg Oral Daily  . enoxaparin (LOVENOX) injection  40 mg Subcutaneous Q24H  . multivitamin  1 tablet Oral BID  . mupirocin ointment   Nasal BID  . pravastatin  40 mg Oral Daily  . tamsulosin  0.4 mg Oral BID  . thiamine  100 mg Oral Daily  . zinc sulfate  220 mg Oral Daily   Continuous Infusions: . remdesivir 100 mg in NS 100 mL Stopped (11/21/19 1007)   PRN Meds:.acetaminophen, chlorpheniramine-HYDROcodone      Subjective:   Jesse [name] was seen and examined today.  No fevers or chills however still short of breath and hypoxic, on NRB, 15 L at the time of my encounter.  Denies any chest pain. Patient denies  abdominal pain, N/V/D/C, new weakness, numbess, tingling.   Objective:   Vitals:   11/21/19 1014 11/21/19 1024 11/21/19 1032 11/21/19 1200  BP: (!) 122/55  118/63 115/63  Pulse: 89 92 87 97  Resp: (!) 23  (!) 21 17 (!) 23  Temp: (!) 96.2 F (35.7 C)  (!) 96.2 F (35.7 C) (!) 97.4 F (36.3 C)  TempSrc: Axillary  Axillary Oral  SpO2:  98% 100% 96%  Weight:      Height:        Intake/Output  Summary (Last 24 hours) at 11/21/2019 1245 Last data filed at 11/21/2019 1032 Gross per 24 hour  Intake 246.53 ml  Output --  Net 246.53 ml     Wt Readings from Last 3 Encounters:  11/21/19 54.8 kg  02/28/19 59.4 kg  02/27/19 63.5 kg     Exam  General: Alert and oriented x 3, on NRB,  Eyes:   HEENT:  Atraumatic, normocephalic  Cardiovascular: S1 S2 auscultated, no murmurs, RRR  Respiratory: Decreased breath sound at the bases, scattered rhonchi  Gastrointestinal: Soft, nontender, nondistended, + bowel sounds  Ext: no pedal edema bilaterally  Neuro: No neurological deficits  Musculoskeletal: No digital cyanosis, clubbing  Skin: No rashes  Psych: Normal affect and demeanor, alert and oriented x3    Data Reviewed:  I have personally reviewed following labs and imaging studies  Micro Results Recent Results (from the past 240 hour(s))  Blood Culture (routine x 2)     Status: None (Preliminary result)   Collection Time: 11/20/19 12:02 PM   Specimen: BLOOD  Result Value Ref Range Status   Specimen Description   Final    BLOOD BLOOD LEFT FOREARM Performed at [facility], [address], [place], [place] [address]    Special Requests   Final    BOTTLES DRAWN AEROBIC AND ANAEROBIC Blood Culture adequate volume Performed at [facility] [address], [place], [place] [address]    Culture   Final    NO GROWTH < 24 HOURS Performed at [facility] [address], [place], [place] [address]    Report Status PENDING  Incomplete  Blood Culture (routine x 2)     Status: None (Preliminary result)   Collection Time: 11/20/19 12:03 PM   Specimen: BLOOD  Result Value Ref Range Status   Specimen Description   Final    BLOOD BLOOD  RIGHT FOREARM Performed at [facility] [address], [place], [place] [address]    Special Requests   Final    BOTTLES DRAWN AEROBIC AND ANAEROBIC Blood Culture adequate volume Performed at [facility] [address] [place], [place] [address]    Culture   Final    NO GROWTH < 24 HOURS Performed at [facility] [address], [place],  [address]    Report Status PENDING  Incomplete  SARS CORONAVIRUS 2 (TAT 6-24 HRS) Nasopharyngeal Nasopharyngeal Swab     Status: Abnormal   Collection Time: 11/20/19  1:21 PM   Specimen: Nasopharyngeal Swab  Result Value Ref Range Status   SARS Coronavirus 2 POSITIVE (A) NEGATIVE Final    Comment: RESULT CALLED TO, READ BACK BY AND VERIFIED WITH: Katha Cabal [id number] 11/21/2019 T. TYSOR (NOTE) SARS-CoV-2 target nucleic acids are DETECTED. The SARS-CoV-2 RNA is generally detectable in upper and lower respiratory specimens during the acute phase of infection. Positive results are indicative of the presence of SARS-CoV-2 RNA. Clinical correlation with patient history and other diagnostic information is  necessary to determine patient infection status. Positive results do not rule out bacterial infection or co-infection with other viruses.  The expected result is Negative. Fact Sheet for Patients: SugarRoll.be Fact Sheet for Healthcare Providers: https://www.woods-mathews.com/ This test is not yet approved or cleared by the [place] FDA and  has been authorized for detection and/or diagnosis of SARS-CoV-2 by FDA under an Emergency Use Authorization (EUA). This EUA will remain  in effect (meaning this test can be used) for  t he duration of the COVID-19 declaration under Section 564(b)(1) of the Act, 21 U.S.C. section 360bbb-3(b)(1), unless the authorization is terminated or revoked sooner. Performed at [facility], [place] [address]  [place], [place] [address]   MRSA PCR Screening     Status: Abnormal   Collection Time: 11/21/19  1:36 AM   Specimen: Nasal Mucosa; Nasopharyngeal  Result Value Ref Range Status   MRSA by PCR POSITIVE (A) NEGATIVE Final    Comment:        The GeneXpert MRSA Assay (FDA approved for NASAL specimens only), is one component of a comprehensive MRSA colonization surveillance program. It is not intended to diagnose MRSA infection nor to guide or monitor treatment for MRSA infections. RESULT CALLED TO, READ BACK BY AND VERIFIED WITH: ROTHERMEL, RN ON 11/20/21 @ 0331 BY LE Performed at [facility] [address] [place], [place] [address]     Radiology Reports DG Chest Port 1 View  Result Date: 11/20/2019 CLINICAL DATA:  Shortness of breath. EXAM: PORTABLE CHEST 1 VIEW COMPARISON:  August 20, 2005. FINDINGS: The heart size and mediastinal contours are within normal limits. No pneumothorax or pleural effusion is noted. Faint bibasilar opacities are noted concerning for subsegmental atelectasis or possibly infiltrate. The visualized skeletal structures are unremarkable. IMPRESSION: Faint bibasilar opacities are noted concerning for subsegmental atelectasis or possibly infiltrate. Electronically Signed   By: Marijo Conception M.D.   On: 11/20/2019 13:17    Lab Data:  CBC: Recent Labs  Lab 11/20/19 1202 11/20/19 2142 11/21/19 0219  WBC 11.6* 12.9* 11.2*  NEUTROABS 8.5*  --  10.1*  HGB 14.1 13.7 14.2  HCT 42.3 40.1 42.7  MCV 94.0 93.0 94.3  PLT 233 225 [id number]   Basic Metabolic Panel: Recent Labs  Lab 11/20/19 1202 11/20/19 2142  NA 137  --   K 3.4*  --   CL 99  --   CO2 26  --   GLUCOSE 112*  --   BUN 44*  --   CREATININE 1.28* 1.10  CALCIUM 8.4*  --    GFR: Estimated Creatinine Clearance: 38.1 mL/min (by C-G formula based on SCr of 1.1 mg/dL). Liver Function Tests: Recent Labs  Lab 11/20/19 1202  AST 57*  ALT 54*  ALKPHOS 60  BILITOT 1.0  PROT 7.1    ALBUMIN 3.4*   No results for input(s): LIPASE, AMYLASE in the last 168 hours. No results for input(s): AMMONIA in the last 168 hours. Coagulation Profile: No results for input(s): INR, PROTIME in the last 168 hours. Cardiac Enzymes: No results for input(s): CKTOTAL, CKMB, CKMBINDEX, TROPONINI in the last 168 hours. BNP (last 3 results) No results for input(s): PROBNP in the last 8760 hours. HbA1C: No results for input(s): HGBA1C in the last 72 hours. CBG: Recent Labs  Lab 11/21/19 1219  GLUCAP 132*   Lipid Profile: Recent Labs    11/20/19 1203  TRIG 91   Thyroid Function Tests: No results for input(s): TSH, T4TOTAL, FREET4, T3FREE, THYROIDAB in the last 72 hours. Anemia Panel: Recent Labs    11/20/19 1203 11/21/19 0219  FERRITIN 706* 875*   Urine analysis:    Component Value Date/Time   COLORURINE RED (A) 02/26/2019 1953   APPEARANCEUR TURBID (A) 02/26/2019 1953   LABSPEC  02/26/2019 1953    TEST NOT REPORTED DUE TO COLOR INTERFERENCE OF URINE PIGMENT   PHURINE  02/26/2019 1953    TEST NOT REPORTED DUE TO COLOR INTERFERENCE OF  URINE PIGMENT   GLUCOSEU (A) 02/26/2019 1953    TEST NOT REPORTED DUE TO COLOR INTERFERENCE OF URINE PIGMENT   HGBUR (A) 02/26/2019 1953    TEST NOT REPORTED DUE TO COLOR INTERFERENCE OF URINE PIGMENT   BILIRUBINUR (A) 02/26/2019 1953    TEST NOT REPORTED DUE TO COLOR INTERFERENCE OF URINE PIGMENT   KETONESUR (A) 02/26/2019 1953    TEST NOT REPORTED DUE TO COLOR INTERFERENCE OF URINE PIGMENT   PROTEINUR (A) 02/26/2019 1953    TEST NOT REPORTED DUE TO COLOR INTERFERENCE OF URINE PIGMENT   UROBILINOGEN 0.2 10/21/2014 0302   NITRITE (A) 02/26/2019 1953    TEST NOT REPORTED DUE TO COLOR INTERFERENCE OF URINE PIGMENT   LEUKOCYTESUR (A) 02/26/2019 1953    TEST NOT REPORTED DUE TO COLOR INTERFERENCE OF URINE PIGMENT     Danella Philson M.D. [facility] 11/21/2019, 12:45 PM   Call night coverage person covering after 7pm

## 2019-11-21 NOTE — Progress Notes (Signed)
Unused tussionex wasted witnessed by [employer], rn

## 2019-11-21 NOTE — Progress Notes (Addendum)
Jesse [name]  [id number] DOB: [birth year] DOA: 11/20/2019 PCP: Jani Gravel, MD    Brief Narrative:  86 year old with a history of BPH, prostate cancer, and anxiety who presented to the [facility] ED with shortness of breath.  He and his wife tested positive for Covid 1/14.  He also reported fevers, chills, loss of appetite, and severe generalized weakness.  EMS found his saturations to be 80% on room air.  Significant Events: 1/14 Covid test positive 1/17 admit via [facility] long ED 1/18 transfer to [facility]  COVID-19 specific Treatment: Decadron 1/17 > Remdesivir 1/17 > Actemra 1/18 Convalescent plasma 1/18  Antimicrobials:  None  Subjective: Patient seen for follow-up visit.  Assessment & Plan:  Covid pneumonia -acute hypoxic respiratory failure  BPH  History of prostate cancer  Moderate protein calorie malnutrition  DVT prophylaxis: Lovenox Code Status: NO CODE BLUE Family Communication:  Disposition Plan: PCU  Consultants:  none  Objective: Blood pressure 118/85, pulse 87, temperature (!) 96.8 F (36 C), temperature source Axillary, resp. rate 19, height 5\' 7"  (1.702 m), weight 54.8 kg, SpO2 93 %.  Intake/Output Summary (Last 24 hours) at 11/21/2019 1815 Last data filed at 11/21/2019 1500 Gross per 24 hour  Intake 466.53 ml  Output 1 ml  Net 465.53 ml   Filed Weights   11/20/19 1052 11/21/19 0030  Weight: 58.1 kg 54.8 kg    Examination:   CBC: Recent Labs  Lab 11/20/19 1202 11/20/19 2142 11/21/19 0219  WBC 11.6* 12.9* 11.2*  NEUTROABS 8.5*  --  10.1*  HGB 14.1 13.7 14.2  HCT 42.3 40.1 42.7  MCV 94.0 93.0 94.3  PLT 233 225 [id number]   Basic Metabolic Panel: Recent Labs  Lab 11/20/19 1202 11/20/19 2142  NA 137  --   K 3.4*  --   CL 99  --   CO2 26  --   GLUCOSE 112*  --   BUN 44*  --   CREATININE 1.28* 1.10  CALCIUM 8.4*  --    GFR: Estimated Creatinine Clearance: 38.1 mL/min (by C-G formula based on SCr of 1.1  mg/dL).  Liver Function Tests: Recent Labs  Lab 11/20/19 1202  AST 57*  ALT 54*  ALKPHOS 60  BILITOT 1.0  PROT 7.1  ALBUMIN 3.4*    CBG: Recent Labs  Lab 11/21/19 1219  GLUCAP 132*    Recent Results (from the past 240 hour(s))  Blood Culture (routine x 2)     Status: None (Preliminary result)   Collection Time: 11/20/19 12:02 PM   Specimen: BLOOD  Result Value Ref Range Status   Specimen Description   Final    BLOOD BLOOD LEFT FOREARM Performed at [facility] [address] [place], [place] [address]    Special Requests   Final    BOTTLES DRAWN AEROBIC AND ANAEROBIC Blood Culture adequate volume Performed at [facility] [address], [place], [place] [address]    Culture   Final    NO GROWTH < 24 HOURS Performed at [facility] [address] [place], [place] [address]    Report Status PENDING  Incomplete  Blood Culture (routine x 2)     Status: None (Preliminary result)   Collection Time: 11/20/19 12:03 PM   Specimen: BLOOD  Result Value Ref Range Status   Specimen Description   Final    BLOOD BLOOD RIGHT FOREARM Performed at [facility] [address] [place], [place] [address]  Special Requests   Final    BOTTLES DRAWN AEROBIC AND ANAEROBIC Blood Culture adequate volume Performed at [facility] [address], [place], [place] [address]    Culture   Final    NO GROWTH < 24 HOURS Performed at [facility] [address], [place], [place] [address]    Report Status PENDING  Incomplete  SARS CORONAVIRUS 2 (TAT 6-24 HRS) Nasopharyngeal Nasopharyngeal Swab     Status: Abnormal   Collection Time: 11/20/19  1:21 PM   Specimen: Nasopharyngeal Swab  Result Value Ref Range Status   SARS Coronavirus 2 POSITIVE (A) NEGATIVE Final    Comment: RESULT CALLED TO, READ BACK BY AND VERIFIED WITH: Katha Cabal [id number] 11/21/2019 T. TYSOR (NOTE) SARS-CoV-2 target  nucleic acids are DETECTED. The SARS-CoV-2 RNA is generally detectable in upper and lower respiratory specimens during the acute phase of infection. Positive results are indicative of the presence of SARS-CoV-2 RNA. Clinical correlation with patient history and other diagnostic information is  necessary to determine patient infection status. Positive results do not rule out bacterial infection or co-infection with other viruses.  The expected result is Negative. Fact Sheet for Patients: SugarRoll.be Fact Sheet for Healthcare Providers: https://www.woods-mathews.com/ This test is not yet approved or cleared by the [place] FDA and  has been authorized for detection and/or diagnosis of SARS-CoV-2 by FDA under an Emergency Use Authorization (EUA). This EUA will remain  in effect (meaning this test can be used) for t he duration of the COVID-19 declaration under Section 564(b)(1) of the Act, 21 U.S.C. section 360bbb-3(b)(1), unless the authorization is terminated or revoked sooner. Performed at [facility], [place] [address] [place], [place] [address]   MRSA PCR Screening     Status: Abnormal   Collection Time: 11/21/19  1:36 AM   Specimen: Nasal Mucosa; Nasopharyngeal  Result Value Ref Range Status   MRSA by PCR POSITIVE (A) NEGATIVE Final    Comment:        The GeneXpert MRSA Assay (FDA approved for NASAL specimens only), is one component of a comprehensive MRSA colonization surveillance program. It is not intended to diagnose MRSA infection nor to guide or monitor treatment for MRSA infections. RESULT CALLED TO, READ BACK BY AND VERIFIED WITH: ROTHERMEL, RN ON 11/20/21 @ 0331 BY LE Performed at [facility] [address] [place], [place] [address]      Scheduled Meds: . sodium chloride   Intravenous Once  . albuterol  2 puff Inhalation Q6H  . vitamin C  500 mg Oral Daily  . Chlorhexidine Gluconate  Cloth  6 each Topical Daily  . cholecalciferol  1,000 Units Oral Daily  . darifenacin  15 mg Oral Daily  . enoxaparin (LOVENOX) injection  40 mg Subcutaneous Q24H  . multivitamin  1 tablet Oral BID  . mupirocin ointment   Nasal BID  . pravastatin  40 mg Oral Daily  . tamsulosin  0.4 mg Oral BID  . thiamine  100 mg Oral Daily  . zinc sulfate  220 mg Oral Daily   Continuous Infusions: . remdesivir 100 mg in NS 100 mL Stopped (11/21/19 1007)     LOS: 1 day   Cherene Altes, MD [facility] Office  [phone] Pager - Text Page per Amion  If 7PM-7AM, please contact night-coverage per Amion 11/21/2019, 6:15 PM

## 2019-11-21 NOTE — Plan of Care (Signed)

## 2019-11-22 LAB — PREPARE FRESH FROZEN PLASMA

## 2019-11-22 LAB — CBC WITH DIFFERENTIAL/PLATELET
Abs Immature Granulocytes: 0.04 10*3/uL (ref 0.00–0.07)
Basophils Absolute: 0 10*3/uL (ref 0.0–0.1)
Basophils Relative: 0 %
Eosinophils Absolute: 0 10*3/uL (ref 0.0–0.5)
Eosinophils Relative: 0 %
HCT: 39.2 % (ref 39.0–52.0)
Hemoglobin: 13.1 g/dL (ref 13.0–17.0)
Immature Granulocytes: 0 %
Lymphocytes Relative: 11 %
Lymphs Abs: 1.1 10*3/uL (ref 0.7–4.0)
MCH: 31 pg (ref 26.0–34.0)
MCHC: 33.4 g/dL (ref 30.0–36.0)
MCV: 92.9 fL (ref 80.0–100.0)
Monocytes Absolute: 0.8 10*3/uL (ref 0.1–1.0)
Monocytes Relative: 8 %
Neutro Abs: 8.7 10*3/uL — ABNORMAL HIGH (ref 1.7–7.7)
Neutrophils Relative %: 81 %
Platelets: 284 10*3/uL (ref 150–400)
RBC: 4.22 MIL/uL (ref 4.22–5.81)
RDW: 13.9 % (ref 11.5–15.5)
WBC: 10.7 10*3/uL — ABNORMAL HIGH (ref 4.0–10.5)
nRBC: 0 % (ref 0.0–0.2)

## 2019-11-22 LAB — D-DIMER, QUANTITATIVE: D-Dimer, Quant: 0.95 ug/mL-FEU — ABNORMAL HIGH (ref 0.00–0.50)

## 2019-11-22 LAB — COMPREHENSIVE METABOLIC PANEL
ALT: 94 U/L — ABNORMAL HIGH (ref 0–44)
AST: 99 U/L — ABNORMAL HIGH (ref 15–41)
Albumin: 2.7 g/dL — ABNORMAL LOW (ref 3.5–5.0)
Alkaline Phosphatase: 58 U/L (ref 38–126)
Anion gap: 10 (ref 5–15)
BUN: 54 mg/dL — ABNORMAL HIGH (ref 8–23)
CO2: 27 mmol/L (ref 22–32)
Calcium: 8.4 mg/dL — ABNORMAL LOW (ref 8.9–10.3)
Chloride: 104 mmol/L (ref 98–111)
Creatinine, Ser: 0.92 mg/dL (ref 0.61–1.24)
GFR calc Af Amer: >60 mL/min (ref >60)
GFR calc non Af Amer: >60 mL/min (ref >60)
Glucose, Bld: 151 mg/dL — ABNORMAL HIGH (ref 70–99)
Potassium: 3.5 mmol/L (ref 3.5–5.1)
Sodium: 141 mmol/L (ref 135–145)
Total Bilirubin: 0.5 mg/dL (ref 0.3–1.2)
Total Protein: 6 g/dL — ABNORMAL LOW (ref 6.5–8.1)

## 2019-11-22 LAB — FERRITIN: Ferritin: 1162 ng/mL — ABNORMAL HIGH (ref 24–336)

## 2019-11-22 LAB — TYPE AND SCREEN
ABO/RH(D): O POS
ABO/RH(D): O POS
Antibody Screen: NEGATIVE
Antibody Screen: NEGATIVE

## 2019-11-22 LAB — C-REACTIVE PROTEIN: CRP: 15.4 mg/dL — ABNORMAL HIGH (ref <1.0)

## 2019-11-22 LAB — BPAM FFP
Blood Product Expiration Date: 202101190849
ISSUE DATE / TIME: 202101180945
Unit Type and Rh: 5100

## 2019-11-22 LAB — ABO/RH: ABO/RH(D): O POS

## 2019-11-22 NOTE — Plan of Care (Signed)

## 2019-11-22 NOTE — Progress Notes (Signed)
Occupational Therapy Evaluation Patient Details Name: Jesse [name] MRN: [id number] DOB: Sep 02, 1932 Today's Date: 11/22/2019    History of Present Illness 86 year old with a history of BPH, prostate cancer, and anxiety who presented to the [facility] ED with shortness of breath.  He and his wife tested positive for Covid 1/14.  He also reported fevers, chills, loss of appetite, and severe generalized weakness.  EMS found his saturations to be 80% on room air.   Clinical Impression   PTA, pt lived in [place] apt at [facility] and was independent with ADL and mobility without the use of an assistive device. Pt able to transfer to toilet, complete hygiene after toileting with min A, then ambulate @ 25 ft in room on 10L @ RW level with SpO2 low 80s and min SOB with quick rebound to 90s. Pt will need rehab at SNF prior to return to his apt - pt in agreement. Pt very appreciative. Encourage pt to mobilize with nsg. Pt left on 8L HFNC with SpO2 95. Will follow acutely.     Follow Up Recommendations  SNF;Other (comment)(at [facility])    Equipment Recommendations  None recommended by OT    Recommendations for Other Services       Precautions / Restrictions Precautions Precautions: Fall Precaution Comments: monitor O2 Sats      Mobility Bed Mobility               General bed mobility comments: OOB with nursing  Transfers Overall transfer level: Needs assistance Equipment used: Rolling walker (2 wheeled) Transfers: Sit to/from Stand Sit to Stand: Min assist         General transfer comment: vc for hand placement    Balance Overall balance assessment: Needs assistance   Sitting balance-Leahy Scale: Good       Standing balance-Leahy Scale: Poor Standing balance comment: reliant on external support                           ADL either performed or assessed with clinical judgement   ADL Overall ADL's : Needs  assistance/impaired Eating/Feeding: Set up   Grooming: Set up;Sitting   Upper Body Bathing: Set up;Sitting   Lower Body Bathing: Moderate assistance;Sit to/from stand   Upper Body Dressing : Minimal assistance;Sitting   Lower Body Dressing: Moderate assistance;Sit to/from stand   Toilet Transfer: Minimal assistance;Stand-pivot   Toileting- Clothing Manipulation and Hygiene: Minimal assistance;Sit to/from stand Toileting - Clothing Manipulation Details (indicate cue type and reason): Pt has his own technique     Functional mobility during ADLs: Minimal assistance;Rolling walker;Cueing for safety General ADL Comments: Ambulated @ 25 ft on 10 L with SpO2 low 80s with quick rebound to 90s     Vision Baseline Vision/History: Wears glasses       Perception     Praxis      Pertinent Vitals/Pain Pain Assessment: No/denies pain     Hand Dominance Right   Extremity/Trunk Assessment Upper Extremity Assessment Upper Extremity Assessment: Generalized weakness   Lower Extremity Assessment Lower Extremity Assessment: Defer to PT evaluation   Cervical / Trunk Assessment Cervical / Trunk Assessment: Normal   Communication Communication Communication: HOH(wears hearing aids)   Cognition Arousal/Alertness: Awake/alert Behavior During Therapy: WFL for tasks assessed/performed Overall Cognitive Status: No family/caregiver present to determine baseline cognitive functioning  General Comments: will further assess cognition but appears most likely close to baseline. Pt is HOH adn comes across as confused at times, however feel it is due to him not understanding due to his hearing   General Comments  Pt very talkative; worked in [employer]; very active    Exercises     Shoulder [place] expects to be discharged to:: Skilled nursing facility                                         Prior Functioning/Environment Level of Independence: Independent        Comments: Lived in [place] at [facility] in an apt. Independent without an AD; still works as a Optometrist for businesses        OT Problem List: Decreased strength;Decreased activity tolerance;Impaired balance (sitting and/or standing);Decreased safety awareness;Decreased knowledge of use of DME or AE;Cardiopulmonary status limiting activity      OT Treatment/Interventions: Self-care/ADL training;Therapeutic exercise;Energy conservation;DME and/or AE instruction;Therapeutic activities;Cognitive remediation/compensation;Patient/family education;Balance training    OT Goals(Current goals can be found in the care plan section) Acute Rehab OT Goals Patient Stated Goal: to be independent and take care of himself OT Goal Formulation: With patient Time For Goal Achievement: 12/06/19 Potential to Achieve Goals: Good  OT Frequency: Min 2X/week   Barriers to D/C:            Co-evaluation              AM-PAC OT "6 Clicks" Daily Activity     Outcome Measure Help from another person eating meals?: A Little Help from another person taking care of personal grooming?: A Little Help from another person toileting, which includes using toliet, bedpan, or urinal?: A Little Help from another person bathing (including washing, rinsing, drying)?: A Little Help from another person to put on and taking off regular upper body clothing?: A Little Help from another person to put on and taking off regular lower body clothing?: A Lot 6 Click Score: 17   End of Session Equipment Utilized During Treatment: Rolling walker;Gait belt;Oxygen(10L) Nurse Communication: Mobility status  Activity Tolerance: Patient tolerated treatment well Patient left: in chair;with call bell/phone within reach;with chair alarm set  OT Visit Diagnosis: Unsteadiness on feet (R26.81);Muscle weakness (generalized) (M62.81)                 Time: [id number] OT Time Calculation (min): 40 min Charges:  OT General Charges $OT Visit: 1 Visit OT Evaluation $OT Eval Moderate Complexity: 1 Mod OT Treatments $Self Care/Home Management : 23-37 mins  Maurie Boettcher, OT/L   Acute OT Clinical Specialist Acute Rehabilitation Services Pager [phone] Office [phone]   [facility] 11/22/2019, 2:50 PM

## 2019-11-22 NOTE — Progress Notes (Signed)
Jesse [name]  [id number] DOB: 1932-01-18 DOA: 11/20/2019 PCP: Jani Gravel, MD    Brief Narrative:  86 year old with a history of BPH, prostate cancer, and anxiety who presented to the [facility] ED with shortness of breath.  He and his wife tested positive for Covid 1/14.  He also reported fevers, chills, loss of appetite, and severe generalized weakness.  EMS found his saturations to be 80% on room air.  Significant Events: 1/14 Covid test positive 1/17 admit via [facility] long ED 1/18 transfer to [facility]  COVID-19 specific Treatment: Decadron 1/17 > Remdesivir 1/17 > 1/21 Actemra 1/18 Convalescent plasma 1/18  Antimicrobials:  None  Subjective: Maintaining saturations in the low 90s on 10 L high flow nasal cannula.  Respirations appear comfortable at the time of exam.  Patient reports very poor appetite and loss of sense of taste.  He denies abdominal pain.  Assessment & Plan:  Covid pneumonia -acute hypoxic respiratory failure Continue Decadron and remdesivir -has been dosed with Actemra and convalescent plasma -appears to be slowly improving at this time -mobilize -recruitment measures  Recent Labs  Lab 11/20/19 1202 11/20/19 1203 11/21/19 0219 11/22/19 0500  DDIMER 1.46*  --  1.38* 0.95*  FERRITIN  --  706* 875* 1,162*  CRP  --  25.2* 24.6* 15.4*  ALT 54*  --   --  94*  PROCALCITON 1.86  --   --   --     BPH Continue home medical therapy  History of prostate cancer  Moderate protein calorie malnutrition Encouraged increased intake  DVT prophylaxis: Lovenox Code Status: NO CODE BLUE - DNR Family Communication:  Disposition Plan: PCU  Consultants:  none  Objective: Blood pressure 116/62, pulse 77, temperature 97.8 F (36.6 C), temperature source Axillary, resp. rate 20, height 5\' 7"  (1.702 m), weight 54.8 kg, SpO2 91 %.  Intake/Output Summary (Last 24 hours) at 11/22/2019 0902 Last data filed at 11/22/2019 0400 Gross per 24 hour  Intake  466.53 ml  Output 326 ml  Net 140.53 ml   Filed Weights   11/20/19 1052 11/21/19 0030  Weight: 58.1 kg 54.8 kg    Examination: General: No acute respiratory distress Lungs: Fine crackles diffusely Cardiovascular: Regular rate and rhythm without murmur gallop or rub normal S1 and S2 Abdomen: Nontender, nondistended, soft, bowel sounds positive, no rebound, no ascites, no appreciable mass - thin  Extremities: No significant cyanosis, clubbing, or edema bilateral lower extremities  CBC: Recent Labs  Lab 11/20/19 1202 11/20/19 1202 11/20/19 2142 11/21/19 0219 11/22/19 0500  WBC 11.6*   < > 12.9* 11.2* 10.7*  NEUTROABS 8.5*  --   --  10.1* 8.7*  HGB 14.1   < > 13.7 14.2 13.1  HCT 42.3   < > 40.1 42.7 39.2  MCV 94.0   < > 93.0 94.3 92.9  PLT 233   < > 225 242 284   < > = values in this interval not displayed.   Basic Metabolic Panel: Recent Labs  Lab 11/20/19 1202 11/20/19 2142 11/22/19 0500  NA 137  --  141  K 3.4*  --  3.5  CL 99  --  104  CO2 26  --  27  GLUCOSE 112*  --  151*  BUN 44*  --  54*  CREATININE 1.28* 1.10 0.92  CALCIUM 8.4*  --  8.4*   GFR: Estimated Creatinine Clearance: 45.5 mL/min (by C-G formula based on SCr of 0.92 mg/dL).  Liver Function Tests: Recent Labs  Lab 11/20/19 1202 11/22/19 0500  AST 57* 99*  ALT 54* 94*  ALKPHOS 60 58  BILITOT 1.0 0.5  PROT 7.1 6.0*  ALBUMIN 3.4* 2.7*    CBG: Recent Labs  Lab 11/21/19 1219  GLUCAP 132*    Recent Results (from the past 240 hour(s))  Blood Culture (routine x 2)     Status: None (Preliminary result)   Collection Time: 11/20/19 12:02 PM   Specimen: BLOOD  Result Value Ref Range Status   Specimen Description   Final    BLOOD BLOOD LEFT FOREARM Performed at [facility], [place] [address], [place], [place] [address]    Special Requests   Final    BOTTLES DRAWN AEROBIC AND ANAEROBIC Blood Culture adequate volume Performed at [facility]  [address], [place], [place] [address]    Culture   Final    NO GROWTH 2 DAYS Performed at [facility] [address], [place], [place] [address]    Report Status PENDING  Incomplete  Blood Culture (routine x 2)     Status: None (Preliminary result)   Collection Time: 11/20/19 12:03 PM   Specimen: BLOOD  Result Value Ref Range Status   Specimen Description   Final    BLOOD BLOOD RIGHT FOREARM Performed at [facility] [address], [place], [place] [address]    Special Requests   Final    BOTTLES DRAWN AEROBIC AND ANAEROBIC Blood Culture adequate volume Performed at [facility] [address] [place], [place] [address]    Culture   Final    NO GROWTH 2 DAYS Performed at [facility] [address] [place], [place] [address]    Report Status PENDING  Incomplete  SARS CORONAVIRUS 2 (TAT 6-24 HRS) Nasopharyngeal Nasopharyngeal Swab     Status: Abnormal   Collection Time: 11/20/19  1:21 PM   Specimen: Nasopharyngeal Swab  Result Value Ref Range Status   SARS Coronavirus 2 POSITIVE (A) NEGATIVE Final    Comment: RESULT CALLED TO, READ BACK BY AND VERIFIED WITH: Katha Cabal [id number] 11/21/2019 T. TYSOR (NOTE) SARS-CoV-2 target nucleic acids are DETECTED. The SARS-CoV-2 RNA is generally detectable in upper and lower respiratory specimens during the acute phase of infection. Positive results are indicative of the presence of SARS-CoV-2 RNA. Clinical correlation with patient history and other diagnostic information is  necessary to determine patient infection status. Positive results do not rule out bacterial infection or co-infection with other viruses.  The expected result is Negative. Fact Sheet for Patients: SugarRoll.be Fact Sheet for Healthcare Providers: https://www.woods-mathews.com/ This test is not yet approved or cleared by the [place] FDA and  has been authorized for  detection and/or diagnosis of SARS-CoV-2 by FDA under an Emergency Use Authorization (EUA). This EUA will remain  in effect (meaning this test can be used) for t he duration of the COVID-19 declaration under Section 564(b)(1) of the Act, 21 U.S.C. section 360bbb-3(b)(1), unless the authorization is terminated or revoked sooner. Performed at [facility], [place] [address] [place],  [address]   MRSA PCR Screening     Status: Abnormal   Collection Time: 11/21/19  1:36 AM   Specimen: Nasal Mucosa; Nasopharyngeal  Result Value Ref Range Status   MRSA by PCR POSITIVE (A) NEGATIVE Final    Comment:        The GeneXpert MRSA Assay (FDA approved for NASAL specimens only), is one component of  a comprehensive MRSA colonization surveillance program. It is not intended to diagnose MRSA infection nor to guide or monitor treatment for MRSA infections. RESULT CALLED TO, READ BACK BY AND VERIFIED WITH: ROTHERMEL, RN ON 11/20/21 @ 0331 BY LE Performed at [facility] [address], [place], [place] [address]      Scheduled Meds: . vitamin C  500 mg Oral Daily  . chlordiazePOXIDE  10 mg Oral Daily  . Chlorhexidine Gluconate Cloth  6 each Topical Daily  . cholecalciferol  1,000 Units Oral Daily  . darifenacin  15 mg Oral Daily  . enoxaparin (LOVENOX) injection  40 mg Subcutaneous Q24H  . multivitamin  1 tablet Oral BID  . mupirocin ointment   Nasal BID  . pravastatin  40 mg Oral Daily  . tamsulosin  0.4 mg Oral BID  . thiamine  100 mg Oral Daily  . vitamin B-12  1,000 mcg Oral Daily  . zinc sulfate  220 mg Oral Daily   Continuous Infusions: . remdesivir 100 mg in NS 100 mL Stopped (11/21/19 1007)     LOS: 2 days   Cherene Altes, MD [facility] Office  [phone] Pager - Text Page per Amion  If 7PM-7AM, please contact night-coverage per Amion 11/22/2019, 9:02 AM

## 2019-11-23 DIAGNOSIS — E876 Hypokalemia: Secondary | ICD-10-CM

## 2019-11-23 DIAGNOSIS — J1282 Pneumonia due to coronavirus disease 2019: Secondary | ICD-10-CM

## 2019-11-23 DIAGNOSIS — J9601 Acute respiratory failure with hypoxia: Secondary | ICD-10-CM

## 2019-11-23 DIAGNOSIS — R7401 Elevation of levels of liver transaminase levels: Secondary | ICD-10-CM

## 2019-11-23 LAB — CBC WITH DIFFERENTIAL/PLATELET
Abs Immature Granulocytes: 0.02 10*3/uL (ref 0.00–0.07)
Basophils Absolute: 0 10*3/uL (ref 0.0–0.1)
Basophils Relative: 0 %
Eosinophils Absolute: 0 10*3/uL (ref 0.0–0.5)
Eosinophils Relative: 1 %
HCT: 45.5 % (ref 39.0–52.0)
Hemoglobin: 15.1 g/dL (ref 13.0–17.0)
Immature Granulocytes: 0 %
Lymphocytes Relative: 44 %
Lymphs Abs: 2.3 10*3/uL (ref 0.7–4.0)
MCH: 31.5 pg (ref 26.0–34.0)
MCHC: 33.2 g/dL (ref 30.0–36.0)
MCV: 94.8 fL (ref 80.0–100.0)
Monocytes Absolute: 0.4 10*3/uL (ref 0.1–1.0)
Monocytes Relative: 7 %
Neutro Abs: 2.5 10*3/uL (ref 1.7–7.7)
Neutrophils Relative %: 48 %
Platelets: 317 10*3/uL (ref 150–400)
RBC: 4.8 MIL/uL (ref 4.22–5.81)
RDW: 14.1 % (ref 11.5–15.5)
WBC: 5.3 10*3/uL (ref 4.0–10.5)
nRBC: 0 % (ref 0.0–0.2)

## 2019-11-23 LAB — COMPREHENSIVE METABOLIC PANEL
ALT: 89 U/L — ABNORMAL HIGH (ref 0–44)
AST: 67 U/L — ABNORMAL HIGH (ref 15–41)
Albumin: 3.1 g/dL — ABNORMAL LOW (ref 3.5–5.0)
Alkaline Phosphatase: 52 U/L (ref 38–126)
Anion gap: 10 (ref 5–15)
BUN: 50 mg/dL — ABNORMAL HIGH (ref 8–23)
CO2: 28 mmol/L (ref 22–32)
Calcium: 8.6 mg/dL — ABNORMAL LOW (ref 8.9–10.3)
Chloride: 104 mmol/L (ref 98–111)
Creatinine, Ser: 0.99 mg/dL (ref 0.61–1.24)
GFR calc Af Amer: >60 mL/min (ref >60)
GFR calc non Af Amer: >60 mL/min (ref >60)
Glucose, Bld: 105 mg/dL — ABNORMAL HIGH (ref 70–99)
Potassium: 3.2 mmol/L — ABNORMAL LOW (ref 3.5–5.1)
Sodium: 142 mmol/L (ref 135–145)
Total Bilirubin: 1.1 mg/dL (ref 0.3–1.2)
Total Protein: 6.4 g/dL — ABNORMAL LOW (ref 6.5–8.1)

## 2019-11-23 LAB — C-REACTIVE PROTEIN: CRP: 8 mg/dL — ABNORMAL HIGH (ref <1.0)

## 2019-11-23 LAB — D-DIMER, QUANTITATIVE: D-Dimer, Quant: 1.08 ug/mL-FEU — ABNORMAL HIGH (ref 0.00–0.50)

## 2019-11-23 LAB — FERRITIN: Ferritin: 860 ng/mL — ABNORMAL HIGH (ref 24–336)

## 2019-11-23 MED ORDER — POTASSIUM CHLORIDE CRYS ER 20 MEQ PO TBCR
40.0000 meq | EXTENDED_RELEASE_TABLET | Freq: Once | ORAL | Status: AC
  Administered 2019-11-23: 40 meq via ORAL
  Filled 2019-11-23: qty 2

## 2019-11-23 NOTE — Evaluation (Signed)
Physical Therapy Evaluation Patient Details Name: Jesse [name] MRN: [id number] DOB: 1932-04-25 Today's Date: 11/23/2019   History of Present Illness  86 year old with a history of BPH, prostate cancer, and anxiety who presented to the [facility] ED with shortness of breath.  He and his wife tested positive for Covid 1/14.  He also reported fevers, chills, loss of appetite, and severe generalized weakness.  EMS found his saturations to be 80% on room air.  Clinical Impression   Pt admitted with above dx was living at independent living facility with spouse and was very independent prior to illness. Spouse also has COVID and was at [place] unit of facility. This am pt is moving quite slow, he initially declined to eat his breakfast then stated he needs to eat to be able to perform any mobility. Nurse on hand to help encourage pt to participate in assessment and tx. Pt needed mod a with most mobility able to get to edge of bed, sit unsupported with fair trunk control, sit<>stand with RW and mod a( 2+ for line management). Pt ambulated approx 50ft in room with RW and min-mod a, with max vcs and redirection to task. Pt was on 5L/min via HFNC while at rest sats in high 80s and into 90s, with ambulation minimal desat noted into low to mid 80s, with cues able to recover quite quickly to high 80s again. Pt would benefit from continued skilled PT tx while in hospital to address deficits in strength, balance and coordination, independence, activity tolerance and safety with mobility for safe d/c back to home community. He would also benefit from post acute care level PT intervention after he gets home.     Follow Up Recommendations SNF;Other (comment)(at home community)    Equipment Recommendations  None recommended by PT    Recommendations for Other Services       Precautions / Restrictions Precautions Precautions: Fall Precaution Comments: monitor O2 Sats Restrictions Weight Bearing Restrictions:  No      Mobility  Bed Mobility Overal bed mobility: Needs Assistance Bed Mobility: Supine to Sit     Supine to sit: Mod assist        Transfers Overall transfer level: Needs assistance Equipment used: Rolling walker (2 wheeled) Transfers: Sit to/from Stand Sit to Stand: Mod assist            Ambulation/Gait Ambulation/Gait assistance: Min assist;Mod assist;+2 safety/equipment Gait Distance (Feet): 30 Feet Assistive device: Rolling walker (2 wheeled) Gait Pattern/deviations: Shuffle Gait velocity: decreased      Stairs            Wheelchair Mobility    Modified Rankin (Stroke Patients Only)       Balance Overall balance assessment: Needs assistance Sitting-balance support: Feet supported Sitting balance-Leahy Scale: Fair     Standing balance support: During functional activity;Bilateral upper extremity supported Standing balance-Leahy Scale: Poor                               Pertinent Vitals/Pain Pain Assessment: Faces Faces Pain Scale: Hurts little more Pain Location: general pain with mobility Pain Intervention(s): Limited activity within patient's tolerance    Home Living Family/patient expects to be discharged to:: Skilled nursing facility                 Additional Comments: pt was living in independent living facility but will need skilled portion of this at dc    Prior Function  Level of Independence: Independent               Hand Dominance   Dominant Hand: Right    Extremity/Trunk Assessment   Upper Extremity Assessment Upper Extremity Assessment: Generalized weakness    Lower Extremity Assessment Lower Extremity Assessment: Generalized weakness    Cervical / Trunk Assessment Cervical / Trunk Assessment: Normal  Communication   Communication: HOH  Cognition Arousal/Alertness: Lethargic Behavior During Therapy: WFL for tasks assessed/performed Overall Cognitive Status: No family/caregiver  present to determine baseline cognitive functioning                                        General Comments      Exercises     Assessment/Plan    PT Assessment Patient needs continued PT services  PT Problem List Decreased strength;Decreased activity tolerance;Decreased balance;Decreased mobility;Decreased coordination;Decreased knowledge of use of DME;Decreased safety awareness       PT Treatment Interventions DME instruction;Gait training;Functional mobility training;Therapeutic activities;Therapeutic exercise;Balance training;Neuromuscular re-education;Patient/family education    PT Goals (Current goals can be found in the Care Plan section)  Acute Rehab PT Goals Patient Stated Goal: to be independent and take care of himself PT Goal Formulation: With patient Time For Goal Achievement: 12/07/19 Potential to Achieve Goals: Fair    Frequency Min 2X/week   Barriers to discharge   spouse also had COVID and was at [place] unit of community    Co-evaluation               AM-PAC PT "6 Clicks" Mobility  Outcome Measure Help needed turning from your back to your side while in a flat bed without using bedrails?: A Little Help needed moving from lying on your back to sitting on the side of a flat bed without using bedrails?: A Little Help needed moving to and from a bed to a chair (including a wheelchair)?: A Lot Help needed standing up from a chair using your arms (e.g., wheelchair or bedside chair)?: A Lot Help needed to walk in hospital room?: A Lot Help needed climbing 3-5 steps with a railing? : A Lot 6 Click Score: 14    End of Session Equipment Utilized During Treatment: Oxygen Activity Tolerance: Patient limited by fatigue;Patient limited by lethargy;Treatment limited secondary to medical complications (Comment) Patient left: in chair;with call bell/phone within reach;with nursing/sitter in room Nurse Communication: Other (comment)(nurse in room  most of session) PT Visit Diagnosis: Other abnormalities of gait and mobility (R26.89);Muscle weakness (generalized) (M62.81)    Time: [id number] PT Time Calculation (min) (ACUTE ONLY): 26 min   Charges:   PT Evaluation $PT Eval Moderate Complexity: 1 Mod PT Treatments $Gait Training: 8-22 mins        Horald Chestnut, PT   [facility]elford [facility] 11/23/2019, 12:45 PM

## 2019-11-23 NOTE — Progress Notes (Signed)
Jesse [name]  [id number] DOB: 12-15-1931 DOA: 11/20/2019 PCP: Jani Gravel, MD    Brief Narrative:  86 year old with a history of BPH, prostate cancer, and anxiety who presented to the [facility] ED with shortness of breath.  He and his wife tested positive for Covid 1/14.  He also reported fevers, chills, loss of appetite, and severe generalized weakness.  EMS found his saturations to be 80% on room air.  Significant Events: 1/14 Covid test positive 1/17 admit via [facility] long ED 1/18 transfer to [facility]  COVID-19 specific Treatment: Decadron 1/17 > Remdesivir 1/17 > 1/21 Actemra 1/18 Convalescent plasma 1/18  Antimicrobials:  None  Subjective: Patient states that he is feeling better.  Not as short of breath as he was previously.  Occasional dry cough.  No nausea vomiting.  No chest pain.    Assessment & Plan:  Pneumonia due to COVID-19/acute respiratory failure with hypoxia Patient seems to be stable from a respiratory standpoint.  His oxygen requirements appear to have improved.  He was on 10 L of oxygen yesterday.  He is down to 3 to 4 L today.  D-dimer is stable.  CRP improved to 8.0.  Patient remains on remdesivir and steroids.  His procalcitonin was elevated at 1.86.  It does not appear like he was started on antibacterials.  Continue to hold for now.  Mobilize.  Incentive spirometry.  Prone positioning as possible.    Recent Labs  Lab 11/20/19 1202 11/20/19 1203 11/21/19 0219 11/22/19 0500 11/23/19 0542  DDIMER 1.46*  --  1.38* 0.95* 1.08*  FERRITIN  --  706* 875* 1,162* 860*  CRP  --  25.2* 24.6* 15.4* 8.0*  ALT 54*  --   --  94* 89*  PROCALCITON 1.86  --   --   --   --     BPH Continue home medical therapy  History of prostate cancer  Moderate protein calorie malnutrition Encouraged increased intake  Mild transaminitis Secondary to COVID-19.  Hypokalemia Will be repleted.  Check magnesium.  DVT prophylaxis: Lovenox Code Status:  DNR Family Communication: We will try to update family later today. Disposition Plan: PT and OT evaluation.  Consultants:  none  Objective: Blood pressure (!) 102/55, pulse 70, temperature (!) 97.4 F (36.3 C), temperature source Oral, resp. rate (!) 22, height 5\' 7"  (1.702 m), weight 54.8 kg, SpO2 95 %.  Intake/Output Summary (Last 24 hours) at 11/23/2019 1621 Last data filed at 11/23/2019 1200 Gross per 24 hour  Intake 820 ml  Output --  Net 820 ml   Filed Weights   11/20/19 1052 11/21/19 0030  Weight: 58.1 kg 54.8 kg    Examination:  General appearance: Awake alert.  In no distress Resp: Normal effort at rest.  Coarse breath sounds with crackles at the bases.  No wheezing or rhonchi. Cardio: S1-S2 is normal regular.  No S3-S4.  No rubs murmurs or bruit GI: Abdomen is soft.  Nontender nondistended.  Bowel sounds are present normal.  No masses organomegaly Extremities: No edema.  Full range of motion of lower extremities. Neurologic: Alert and oriented x3.  No focal neurological deficits.    CBC: Recent Labs  Lab 11/21/19 0219 11/22/19 0500 11/23/19 0542  WBC 11.2* 10.7* 5.3  NEUTROABS 10.1* 8.7* 2.5  HGB 14.2 13.1 15.1  HCT 42.7 39.2 45.5  MCV 94.3 92.9 94.8  PLT 242 284 [id number]   Basic Metabolic Panel: Recent Labs  Lab 11/20/19 1202 11/20/19 1202 11/20/19 2142 11/22/19  0500 11/23/19 0542  NA 137  --   --  141 142  K 3.4*  --   --  3.5 3.2*  CL 99  --   --  104 104  CO2 26  --   --  27 28  GLUCOSE 112*  --   --  151* 105*  BUN 44*  --   --  54* 50*  CREATININE 1.28*   < > 1.10 0.92 0.99  CALCIUM 8.4*  --   --  8.4* 8.6*   < > = values in this interval not displayed.   GFR: Estimated Creatinine Clearance: 42.3 mL/min (by C-G formula based on SCr of 0.99 mg/dL).  Liver Function Tests: Recent Labs  Lab 11/20/19 1202 11/22/19 0500 11/23/19 0542  AST 57* 99* 67*  ALT 54* 94* 89*  ALKPHOS 60 58 52  BILITOT 1.0 0.5 1.1  PROT 7.1 6.0* 6.4*  ALBUMIN  3.4* 2.7* 3.1*    CBG: Recent Labs  Lab 11/21/19 1219  GLUCAP 132*    Recent Results (from the past 240 hour(s))  Blood Culture (routine x 2)     Status: None (Preliminary result)   Collection Time: 11/20/19 12:02 PM   Specimen: BLOOD  Result Value Ref Range Status   Specimen Description   Final    BLOOD BLOOD LEFT FOREARM Performed at [facility], [place] [address], [place], [place] [address]    Special Requests   Final    BOTTLES DRAWN AEROBIC AND ANAEROBIC Blood Culture adequate volume Performed at [place] [address], [place], [place] [address]    Culture   Final    NO GROWTH 3 DAYS Performed at [facility], [place] [address], [place], [place] [address]    Report Status PENDING  Incomplete  Blood Culture (routine x 2)     Status: None (Preliminary result)   Collection Time: 11/20/19 12:03 PM   Specimen: BLOOD  Result Value Ref Range Status   Specimen Description   Final    BLOOD BLOOD RIGHT FOREARM Performed at [place] [address], [place], [place] [address]    Special Requests   Final    BOTTLES DRAWN AEROBIC AND ANAEROBIC Blood Culture adequate volume Performed at [place] [address], [place], [place] [address]    Culture   Final    NO GROWTH 3 DAYS Performed at [facility], [place] [address] [place], [place] [address]    Report Status PENDING  Incomplete  SARS CORONAVIRUS 2 (TAT 6-24 HRS) Nasopharyngeal Nasopharyngeal Swab     Status: Abnormal   Collection Time: 11/20/19  1:21 PM   Specimen: Nasopharyngeal Swab  Result Value Ref Range Status   SARS Coronavirus 2 POSITIVE (A) NEGATIVE Final    Comment: RESULT CALLED TO, READ BACK BY AND VERIFIED WITH: Katha Cabal [id number] 11/21/2019 T. TYSOR (NOTE) SARS-CoV-2 target nucleic acids are DETECTED. The SARS-CoV-2 RNA is generally detectable in upper and lower respiratory specimens during the acute phase of  infection. Positive results are indicative of the presence of SARS-CoV-2 RNA. Clinical correlation with patient history and other diagnostic information is  necessary to determine patient infection status. Positive results do not rule out bacterial infection or co-infection with other viruses.  The expected result is Negative. Fact Sheet for Patients: SugarRoll.be Fact Sheet for Healthcare Providers: https://www.woods-mathews.com/ This test is not yet approved or cleared by the [place] FDA and  has  been authorized for detection and/or diagnosis of SARS-CoV-2 by FDA under an Emergency Use Authorization (EUA). This EUA will remain  in effect (meaning this test can be used) for t he duration of the COVID-19 declaration under Section 564(b)(1) of the Act, 21 U.S.C. section 360bbb-3(b)(1), unless the authorization is terminated or revoked sooner. Performed at [facility], [place] [address], [place], [place] [address]   MRSA PCR Screening     Status: Abnormal   Collection Time: 11/21/19  1:36 AM   Specimen: Nasal Mucosa; Nasopharyngeal  Result Value Ref Range Status   MRSA by PCR POSITIVE (A) NEGATIVE Final    Comment:        The GeneXpert MRSA Assay (FDA approved for NASAL specimens only), is one component of a comprehensive MRSA colonization surveillance program. It is not intended to diagnose MRSA infection nor to guide or monitor treatment for MRSA infections. RESULT CALLED TO, READ BACK BY AND VERIFIED WITH: ROTHERMEL, RN ON 11/20/21 @ 0331 BY LE Performed at [place] [address], [place], [place] [address]      Radiology  DG Chest [place] 1 View  Result Date: 11/20/2019 CLINICAL DATA:  Shortness of breath. EXAM: PORTABLE CHEST 1 VIEW COMPARISON:  August 20, 2005. FINDINGS: The heart size and mediastinal contours are within normal limits. No pneumothorax or pleural effusion is noted. Faint bibasilar  opacities are noted concerning for subsegmental atelectasis or possibly infiltrate. The visualized skeletal structures are unremarkable. IMPRESSION: Faint bibasilar opacities are noted concerning for subsegmental atelectasis or possibly infiltrate. Electronically Signed   By: Marijo Conception M.D.   On: 11/20/2019 13:17     Scheduled Meds: . vitamin C  500 mg Oral Daily  . chlordiazePOXIDE  10 mg Oral Daily  . Chlorhexidine Gluconate Cloth  6 each Topical Daily  . cholecalciferol  1,000 Units Oral Daily  . darifenacin  15 mg Oral Daily  . enoxaparin (LOVENOX) injection  40 mg Subcutaneous Q24H  . multivitamin  1 tablet Oral BID  . mupirocin ointment   Nasal BID  . pravastatin  40 mg Oral Daily  . tamsulosin  0.4 mg Oral BID  . thiamine  100 mg Oral Daily  . vitamin B-12  1,000 mcg Oral Daily  . zinc sulfate  220 mg Oral Daily   Continuous Infusions: . remdesivir 100 mg in NS 100 mL 100 mg (11/23/19 1031)     LOS: 3 days   [place] [facility] Office  [phone] Pager - Text Page per Shea Evans  If 7PM-7AM, please contact night-coverage per Amion 11/23/2019, 4:21 PM

## 2019-11-24 LAB — CBC WITH DIFFERENTIAL/PLATELET
Abs Immature Granulocytes: 0.03 10*3/uL (ref 0.00–0.07)
Basophils Absolute: 0 10*3/uL (ref 0.0–0.1)
Basophils Relative: 1 %
Eosinophils Absolute: 0 10*3/uL (ref 0.0–0.5)
Eosinophils Relative: 1 %
HCT: 40 % (ref 39.0–52.0)
Hemoglobin: 13.3 g/dL (ref 13.0–17.0)
Immature Granulocytes: 1 %
Lymphocytes Relative: 37 %
Lymphs Abs: 1.4 10*3/uL (ref 0.7–4.0)
MCH: 31.5 pg (ref 26.0–34.0)
MCHC: 33.3 g/dL (ref 30.0–36.0)
MCV: 94.8 fL (ref 80.0–100.0)
Monocytes Absolute: 0.4 10*3/uL (ref 0.1–1.0)
Monocytes Relative: 11 %
Neutro Abs: 1.9 10*3/uL (ref 1.7–7.7)
Neutrophils Relative %: 49 %
Platelets: 266 10*3/uL (ref 150–400)
RBC: 4.22 MIL/uL (ref 4.22–5.81)
RDW: 14.1 % (ref 11.5–15.5)
WBC: 3.9 10*3/uL — ABNORMAL LOW (ref 4.0–10.5)
nRBC: 0 % (ref 0.0–0.2)

## 2019-11-24 LAB — COMPREHENSIVE METABOLIC PANEL
ALT: 66 U/L — ABNORMAL HIGH (ref 0–44)
AST: 39 U/L (ref 15–41)
Albumin: 2.7 g/dL — ABNORMAL LOW (ref 3.5–5.0)
Alkaline Phosphatase: 45 U/L (ref 38–126)
Anion gap: 9 (ref 5–15)
BUN: 42 mg/dL — ABNORMAL HIGH (ref 8–23)
CO2: 26 mmol/L (ref 22–32)
Calcium: 8.3 mg/dL — ABNORMAL LOW (ref 8.9–10.3)
Chloride: 106 mmol/L (ref 98–111)
Creatinine, Ser: 0.81 mg/dL (ref 0.61–1.24)
GFR calc Af Amer: >60 mL/min (ref >60)
GFR calc non Af Amer: >60 mL/min (ref >60)
Glucose, Bld: 104 mg/dL — ABNORMAL HIGH (ref 70–99)
Potassium: 3.7 mmol/L (ref 3.5–5.1)
Sodium: 141 mmol/L (ref 135–145)
Total Bilirubin: 0.9 mg/dL (ref 0.3–1.2)
Total Protein: 5.2 g/dL — ABNORMAL LOW (ref 6.5–8.1)

## 2019-11-24 LAB — D-DIMER, QUANTITATIVE: D-Dimer, Quant: 0.9 ug/mL-FEU — ABNORMAL HIGH (ref 0.00–0.50)

## 2019-11-24 LAB — FERRITIN: Ferritin: 408 ng/mL — ABNORMAL HIGH (ref 24–336)

## 2019-11-24 LAB — C-REACTIVE PROTEIN: CRP: 3.6 mg/dL — ABNORMAL HIGH (ref <1.0)

## 2019-11-24 LAB — MAGNESIUM: Magnesium: 2.4 mg/dL (ref 1.7–2.4)

## 2019-11-24 MED ORDER — FUROSEMIDE 10 MG/ML IJ SOLN
20.0000 mg | Freq: Once | INTRAMUSCULAR | Status: AC
  Administered 2019-11-24: 20 mg via INTRAVENOUS
  Filled 2019-11-24: qty 2

## 2019-11-24 NOTE — Progress Notes (Addendum)
Occupational Therapy Treatment Patient Details Name: Jesse [name] MRN: [id number] DOB: 12/17/1931 Today's Date: 11/24/2019    History of present illness 86 year old with a history of BPH, prostate cancer, and anxiety who presented to the [facility] ED with shortness of breath.  He and his wife tested positive for Covid 1/14.  He also reported fevers, chills, loss of appetite, and severe generalized weakness.  EMS found his saturations to be 80% on room air.   OT comments  Patient very pleasant and looking much improved since last visit.  His cognition was clearer and SpO2 was doing well with mobility.  Remained on 2L throughout the session and was in upper 90s at rest and lower 90s with prolonged standing after ambulating with the nurse.  Completed ADLs at sink with setup.  OT will continue to follow acutely to address the deficits listed below.    Follow Up Recommendations  SNF;Other (comment)    Equipment Recommendations  None recommended by OT    Recommendations for Other Services      Precautions / Restrictions Precautions Precautions: Fall Restrictions Weight Bearing Restrictions: No       Mobility Bed Mobility               General bed mobility comments: Up in chair  Transfers Overall transfer level: Needs assistance Equipment used: Rolling walker (2 wheeled) Transfers: Sit to/from Stand Sit to Stand: Min assist              Balance Overall balance assessment: Needs assistance Sitting-balance support: Feet supported Sitting balance-Leahy Scale: Fair     Standing balance support: Bilateral upper extremity supported;During functional activity Standing balance-Leahy Scale: Poor                             ADL either performed or assessed with clinical judgement   ADL Overall ADL's : Needs assistance/impaired     Grooming: Wash/dry hands;Wash/dry face;Oral care;Brushing hair;Set up;Standing   Upper Body Bathing: Set up;Sitting                            Functional mobility during ADLs: Min guard;Rolling walker       Vision       Perception     Praxis      Cognition Arousal/Alertness: Awake/alert Behavior During Therapy: WFL for tasks assessed/performed Overall Cognitive Status: No family/caregiver present to determine baseline cognitive functioning                                          Exercises     Shoulder Instructions       General Comments      Pertinent Vitals/ Pain       Pain Assessment: No/denies pain  Home Living                                          Prior Functioning/Environment              Frequency  Min 2X/week        Progress Toward Goals  OT Goals(current goals can now be found in the care plan section)  Progress towards OT goals: Progressing toward goals  Acute Rehab  OT Goals Patient Stated Goal: to be independent and take care of himself OT Goal Formulation: With patient Time For Goal Achievement: 12/06/19 Potential to Achieve Goals: Good  Plan Discharge plan remains appropriate    Co-evaluation                 AM-PAC OT "6 Clicks" Daily Activity     Outcome Measure   Help from another person eating meals?: None Help from another person taking care of personal grooming?: A Little Help from another person toileting, which includes using toliet, bedpan, or urinal?: A Little Help from another person bathing (including washing, rinsing, drying)?: A Little Help from another person to put on and taking off regular upper body clothing?: A Little Help from another person to put on and taking off regular lower body clothing?: A Little 6 Click Score: 19    End of Session Equipment Utilized During Treatment: Rolling walker;Gait belt;Oxygen  OT Visit Diagnosis: Unsteadiness on feet (R26.81);Muscle weakness (generalized) (M62.81)   Activity Tolerance Patient tolerated treatment well   Patient Left in  chair;with call bell/phone within reach;with chair alarm set   Nurse Communication Mobility status        Time: [id number] OT Time Calculation (min): 39 min  Charges: OT General Charges $OT Visit: 1 Visit OT Treatments $Self Care/Home Management : 38-52 mins  August Luz, OTR/L   Phylliss Bob 11/24/2019, 3:17 PM

## 2019-11-24 NOTE — Progress Notes (Signed)
Patient ambulated into hallway with front wheel walker. Ambulated about 80 ft in total. Stood from bed to walker without assistance. Slow steady unassisted gait throughout walk.   Stopped at sink to allow patient to wash hand, brush teeth, and wash face.   Walked to chair from there and allowed patient to wash hair with the shower cap.   Up in chair at this time.  02 greater than 88 throughout all activities.

## 2019-11-24 NOTE — Progress Notes (Signed)
Jesse [name]  [id number] DOB: 1931-12-30 DOA: 11/20/2019 PCP: Jani Gravel, MD    Brief Narrative:  86 year old with a history of BPH, prostate cancer, and anxiety who presented to the [facility] ED with shortness of breath.  He and his wife tested positive for Covid 1/14.  He also reported fevers, chills, loss of appetite, and severe generalized weakness.  EMS found his saturations to be 80% on room air.  Significant Events: 1/14 Covid test positive 1/17 admit via [facility] long ED 1/18 transfer to [facility]  COVID-19 specific Treatment: Decadron 1/17 > Remdesivir 1/17 > 1/21 Actemra 1/18 Convalescent plasma 1/18  Antimicrobials:  Anti-infectives (From admission, onward)   Start     Dose/Rate Route Frequency Ordered Stop   11/21/19 1215  azithromycin (ZITHROMAX) 500 mg in sodium chloride 0.9 % 250 mL IVPB  Status:  Discontinued     500 mg 250 mL/hr over 60 Minutes Intravenous Every 24 hours 11/21/19 1205 11/21/19 1220   11/21/19 1215  cefTRIAXone (ROCEPHIN) 2 g in sodium chloride 0.9 % 100 mL IVPB  Status:  Discontinued     2 g 200 mL/hr over 30 Minutes Intravenous Every 24 hours 11/21/19 1205 11/21/19 1220   11/21/19 1000  remdesivir 100 mg in sodium chloride 0.9 % 100 mL IVPB     100 mg 200 mL/hr over 30 Minutes Intravenous Daily 11/20/19 1447 11/24/19 1103   11/21/19 1000  remdesivir 100 mg in sodium chloride 0.9 % 100 mL IVPB  Status:  Discontinued     100 mg 200 mL/hr over 30 Minutes Intravenous Daily 11/20/19 2025 11/20/19 2036   11/20/19 2024  remdesivir 200 mg in sodium chloride 0.9% 250 mL IVPB  Status:  Discontinued     200 mg 580 mL/hr over 30 Minutes Intravenous Once 11/20/19 2025 11/20/19 2036   11/20/19 1600  remdesivir 200 mg in sodium chloride 0.9% 250 mL IVPB     200 mg 580 mL/hr over 30 Minutes Intravenous Once 11/20/19 1447 11/20/19 1652       Subjective: Patient states that he is feeling better.  Not as short of breath as before.  Occasional  dry cough.  He states that his wife is in a skilled nursing facility and he is willing to go to the same facility.      Assessment & Plan:  Pneumonia due to COVID-19/acute respiratory failure with hypoxia Patient seems to be stable from a respiratory standpoint.  He is down to 2-3 L of oxygen from about 10 L that he was requiring a few days ago.  His D-dimer is stable.  Inflammatory markers have improved.  His procalcitonin was noted to be elevated at the time of admission but the patient was not started on antibacterials.  Since he is otherwise improved we will continue to hold.  Continue with incentive spirometry mobilization prone positioning.  Patient will complete course of remdesivir today.  He remains on steroids.  He also received Actemra and convalescent plasma.  Low-dose Lasix today.  Recent Labs  Lab 11/20/19 1202 11/20/19 1203 11/21/19 0219 11/22/19 0500 11/23/19 0542 11/24/19 0235  DDIMER 1.46*  --  1.38* 0.95* 1.08* 0.90*  FERRITIN  --  706* 875* 1,162* 860* 408*  CRP  --  25.2* 24.6* 15.4* 8.0* 3.6*  ALT 54*  --   --  94* 89* 66*  PROCALCITON 1.86  --   --   --   --   --     BPH Continue home  medical therapy  History of prostate cancer  Moderate protein calorie malnutrition Encouraged increased intake  Mild transaminitis Secondary to COVID-19.  Hypokalemia Potassium is normal this morning.  Magnesium is 2.4.  DVT prophylaxis: Lovenox Code Status: DNR Family Communication: We will try and update family later today.  Could not do so yesterday. Disposition Plan: SNF recommended by physical therapy.  Patient agreeable.  Apparently his wife is also at [facility].  Will consult social worker.  Consultants:  none  Objective: Blood pressure (!) 115/51, pulse 63, temperature 97.8 F (36.6 C), temperature source Axillary, resp. rate 17, height 5\' 7"  (1.702 m), weight 54.8 kg, SpO2 91 %.  Intake/Output Summary (Last 24 hours) at 11/24/2019 1124 Last data filed at  11/24/2019 0800 Gross per 24 hour  Intake 1060 ml  Output 700 ml  Net 360 ml   Filed Weights   11/20/19 1052 11/21/19 0030  Weight: 58.1 kg 54.8 kg    Examination:  General appearance: Awake alert.  In no distress Resp: Crackles bilateral bases.  Normal effort at rest.  No wheezing or rhonchi.   Cardio: S1-S2 is normal regular.  No S3-S4.  No rubs murmurs or bruit GI: Abdomen is soft.  Nontender nondistended.  Bowel sounds are present normal.  No masses organomegaly Extremities: No edema.  Full range of motion of lower extremities. Neurologic: Alert and oriented x3.  No focal neurological deficits.    CBC: Recent Labs  Lab 11/22/19 0500 11/23/19 0542 11/24/19 0235  WBC 10.7* 5.3 3.9*  NEUTROABS 8.7* 2.5 1.9  HGB 13.1 15.1 13.3  HCT 39.2 45.5 40.0  MCV 92.9 94.8 94.8  PLT 284 317 [id number]   Basic Metabolic Panel: Recent Labs  Lab 11/22/19 0500 11/23/19 0542 11/24/19 0235  NA 141 142 141  K 3.5 3.2* 3.7  CL 104 104 106  CO2 27 28 26   GLUCOSE 151* 105* 104*  BUN 54* 50* 42*  CREATININE 0.92 0.99 0.81  CALCIUM 8.4* 8.6* 8.3*  MG  --   --  2.4   GFR: Estimated Creatinine Clearance: 51.7 mL/min (by C-G formula based on SCr of 0.81 mg/dL).  Liver Function Tests: Recent Labs  Lab 11/20/19 1202 11/22/19 0500 11/23/19 0542 11/24/19 0235  AST 57* 99* 67* 39  ALT 54* 94* 89* 66*  ALKPHOS 60 58 52 45  BILITOT 1.0 0.5 1.1 0.9  PROT 7.1 6.0* 6.4* 5.2*  ALBUMIN 3.4* 2.7* 3.1* 2.7*    CBG: Recent Labs  Lab 11/21/19 1219  GLUCAP 132*    Recent Results (from the past 240 hour(s))  Blood Culture (routine x 2)     Status: None (Preliminary result)   Collection Time: 11/20/19 12:02 PM   Specimen: BLOOD  Result Value Ref Range Status   Specimen Description   Final    BLOOD BLOOD LEFT FOREARM Performed at [facility], [place] [address], [place], [place] [address]    Special Requests   Final    BOTTLES DRAWN AEROBIC AND ANAEROBIC Blood Culture  adequate volume Performed at [place] [address], [place], [place] [address]    Culture   Final    NO GROWTH 4 DAYS Performed at [facility], [place] [address], [place], [place] [address]    Report Status PENDING  Incomplete  Blood Culture (routine x 2)     Status: None (Preliminary result)   Collection Time: 11/20/19 12:03 PM   Specimen: BLOOD  Result Value Ref Range Status  Specimen Description   Final    BLOOD BLOOD RIGHT FOREARM Performed at [place] [address], [place], [place] [address]    Special Requests   Final    BOTTLES DRAWN AEROBIC AND ANAEROBIC Blood Culture adequate volume Performed at [place] [address], [place], [place] [address]    Culture   Final    NO GROWTH 4 DAYS Performed at [facility], [place] [address] [place], [place] [address]    Report Status PENDING  Incomplete  SARS CORONAVIRUS 2 (TAT 6-24 HRS) Nasopharyngeal Nasopharyngeal Swab     Status: Abnormal   Collection Time: 11/20/19  1:21 PM   Specimen: Nasopharyngeal Swab  Result Value Ref Range Status   SARS Coronavirus 2 POSITIVE (A) NEGATIVE Final    Comment: RESULT CALLED TO, READ BACK BY AND VERIFIED WITH: Katha Cabal [id number] 11/21/2019 T. TYSOR (NOTE) SARS-CoV-2 target nucleic acids are DETECTED. The SARS-CoV-2 RNA is generally detectable in upper and lower respiratory specimens during the acute phase of infection. Positive results are indicative of the presence of SARS-CoV-2 RNA. Clinical correlation with patient history and other diagnostic information is  necessary to determine patient infection status. Positive results do not rule out bacterial infection or co-infection with other viruses.  The expected result is Negative. Fact Sheet for Patients: SugarRoll.be Fact Sheet for Healthcare Providers: https://www.woods-mathews.com/ This test is not yet approved  or cleared by the [place] FDA and  has been authorized for detection and/or diagnosis of SARS-CoV-2 by FDA under an Emergency Use Authorization (EUA). This EUA will remain  in effect (meaning this test can be used) for t he duration of the COVID-19 declaration under Section 564(b)(1) of the Act, 21 U.S.C. section 360bbb-3(b)(1), unless the authorization is terminated or revoked sooner. Performed at [facility], [place] [address], [place], [place] [address]   MRSA PCR Screening     Status: Abnormal   Collection Time: 11/21/19  1:36 AM   Specimen: Nasal Mucosa; Nasopharyngeal  Result Value Ref Range Status   MRSA by PCR POSITIVE (A) NEGATIVE Final    Comment:        The GeneXpert MRSA Assay (FDA approved for NASAL specimens only), is one component of a comprehensive MRSA colonization surveillance program. It is not intended to diagnose MRSA infection nor to guide or monitor treatment for MRSA infections. RESULT CALLED TO, READ BACK BY AND VERIFIED WITH: ROTHERMEL, RN ON 11/20/21 @ 0331 BY LE Performed at [place] [address], [place], [place] [address]      Radiology  DG Chest [place] 1 View  Result Date: 11/20/2019 CLINICAL DATA:  Shortness of breath. EXAM: PORTABLE CHEST 1 VIEW COMPARISON:  August 20, 2005. FINDINGS: The heart size and mediastinal contours are within normal limits. No pneumothorax or pleural effusion is noted. Faint bibasilar opacities are noted concerning for subsegmental atelectasis or possibly infiltrate. The visualized skeletal structures are unremarkable. IMPRESSION: Faint bibasilar opacities are noted concerning for subsegmental atelectasis or possibly infiltrate. Electronically Signed   By: Marijo Conception M.D.   On: 11/20/2019 13:17     Scheduled Meds: . vitamin C  500 mg Oral Daily  . chlordiazePOXIDE  10 mg Oral Daily  . Chlorhexidine Gluconate Cloth  6 each Topical Daily  . cholecalciferol  1,000 Units Oral  Daily  . darifenacin  15 mg Oral Daily  . enoxaparin (LOVENOX) injection  40 mg Subcutaneous Q24H  . multivitamin  1 tablet Oral BID  . mupirocin ointment   Nasal BID  . pravastatin  40 mg Oral Daily  . tamsulosin  0.4 mg Oral BID  . thiamine  100 mg Oral Daily  . vitamin B-12  1,000 mcg Oral Daily  . zinc sulfate  220 mg Oral Daily   Continuous Infusions:    LOS: 4 days   [place] [facility] Office  [phone] Pager - Text Page per Shea Evans  If 7PM-7AM, please contact night-coverage per Amion 11/24/2019, 11:24 AM

## 2019-11-25 LAB — CBC WITH DIFFERENTIAL/PLATELET
Abs Immature Granulocytes: 0.08 10*3/uL — ABNORMAL HIGH (ref 0.00–0.07)
Basophils Absolute: 0 10*3/uL (ref 0.0–0.1)
Basophils Relative: 1 %
Eosinophils Absolute: 0.1 10*3/uL (ref 0.0–0.5)
Eosinophils Relative: 1 %
HCT: 40.7 % (ref 39.0–52.0)
Hemoglobin: 13.7 g/dL (ref 13.0–17.0)
Immature Granulocytes: 2 %
Lymphocytes Relative: 33 %
Lymphs Abs: 1.4 10*3/uL (ref 0.7–4.0)
MCH: 32.1 pg (ref 26.0–34.0)
MCHC: 33.7 g/dL (ref 30.0–36.0)
MCV: 95.3 fL (ref 80.0–100.0)
Monocytes Absolute: 0.4 10*3/uL (ref 0.1–1.0)
Monocytes Relative: 10 %
Neutro Abs: 2.2 10*3/uL (ref 1.7–7.7)
Neutrophils Relative %: 53 %
Platelets: 307 10*3/uL (ref 150–400)
RBC: 4.27 MIL/uL (ref 4.22–5.81)
RDW: 14 % (ref 11.5–15.5)
WBC: 4.2 10*3/uL (ref 4.0–10.5)
nRBC: 0 % (ref 0.0–0.2)

## 2019-11-25 LAB — D-DIMER, QUANTITATIVE: D-Dimer, Quant: 0.67 ug/mL-FEU — ABNORMAL HIGH (ref 0.00–0.50)

## 2019-11-25 LAB — CULTURE, BLOOD (ROUTINE X 2)
Culture: NO GROWTH
Culture: NO GROWTH
Special Requests: ADEQUATE
Special Requests: ADEQUATE

## 2019-11-25 LAB — COMPREHENSIVE METABOLIC PANEL
ALT: 64 U/L — ABNORMAL HIGH (ref 0–44)
AST: 37 U/L (ref 15–41)
Albumin: 2.9 g/dL — ABNORMAL LOW (ref 3.5–5.0)
Alkaline Phosphatase: 49 U/L (ref 38–126)
Anion gap: 11 (ref 5–15)
BUN: 39 mg/dL — ABNORMAL HIGH (ref 8–23)
CO2: 31 mmol/L (ref 22–32)
Calcium: 9.3 mg/dL (ref 8.9–10.3)
Chloride: 102 mmol/L (ref 98–111)
Creatinine, Ser: 0.86 mg/dL (ref 0.61–1.24)
GFR calc Af Amer: >60 mL/min (ref >60)
GFR calc non Af Amer: >60 mL/min (ref >60)
Glucose, Bld: 110 mg/dL — ABNORMAL HIGH (ref 70–99)
Potassium: 3.6 mmol/L (ref 3.5–5.1)
Sodium: 144 mmol/L (ref 135–145)
Total Bilirubin: 0.7 mg/dL (ref 0.3–1.2)
Total Protein: 5.5 g/dL — ABNORMAL LOW (ref 6.5–8.1)

## 2019-11-25 LAB — FERRITIN: Ferritin: 316 ng/mL (ref 24–336)

## 2019-11-25 LAB — C-REACTIVE PROTEIN: CRP: 2 mg/dL — ABNORMAL HIGH (ref <1.0)

## 2019-11-25 MED ORDER — BENZONATATE 100 MG PO CAPS: 100.0000 mg | ORAL_CAPSULE | Freq: Three times a day (TID) | ORAL | 0 refills | Status: DC | PRN

## 2019-11-25 MED ORDER — ASCORBIC ACID 500 MG PO TABS: 500.0000 mg | ORAL_TABLET | Freq: Every day | ORAL | 0 refills | Status: AC

## 2019-11-25 MED ORDER — HYDROCODONE-ACETAMINOPHEN 5-325 MG PO TABS: 1.0000 | ORAL_TABLET | Freq: Four times a day (QID) | ORAL | 0 refills | Status: DC | PRN

## 2019-11-25 MED ORDER — CHLORDIAZEPOXIDE HCL 10 MG PO CAPS: 10.0000 mg | ORAL_CAPSULE | Freq: Every day | ORAL | 0 refills | Status: DC

## 2019-11-25 MED ORDER — ZINC SULFATE 220 (50 ZN) MG PO CAPS: 220.0000 mg | ORAL_CAPSULE | Freq: Every day | ORAL | 0 refills | Status: AC

## 2019-11-25 NOTE — NC FL2 (Signed)
Jesse Jesse [name] MEDICAID FL2 LEVEL OF CARE SCREENING TOOL     IDENTIFICATION  Patient Name: SALEM LASKOSKI Jesse [name] Birthdate: August 27, 1932 Sex: male Admission Date (Current Location): 11/20/2019  [facility] and [place] Number:  Herbalist and Address:  [facility] [place], [place], [place] [address]([place])      Provider Number: [id number]  Attending Physician Name and Address:  Jesse Haff, MD  Relative Name and Phone Number:  Jesse [name] (son) [phone]    Current Level of Care: Hospital Recommended Level of Care: [place] Prior Approval Number:    Date Approved/Denied:   PASRR Number: [id number]  Discharge Plan: SNF    Current Diagnoses: Patient Active Problem List   Diagnosis Date Noted  . Acute respiratory failure due to COVID-19 ([place]) 11/20/2019  . BPH (benign prostatic hyperplasia) 11/20/2019  . History of prostate cancer 11/20/2019  . Acute respiratory failure ([place]) 11/20/2019    Orientation RESPIRATION BLADDER Height & Weight     Self, Time, Situation, Place  O2(1-2 L nasal cannula) Incontinent, External catheter Weight: 120 lb 13 oz (54.8 kg) Height:  5\' 7"  (170.2 cm)  BEHAVIORAL SYMPTOMS/MOOD NEUROLOGICAL BOWEL NUTRITION STATUS      Continent Diet(see discharge summary)  AMBULATORY STATUS COMMUNICATION OF NEEDS Skin   Limited Assist Verbally Other (Comment)(pressury injury buttocks, ecchymosis)                       Personal Care Assistance Level of Assistance  Bathing, Feeding, Dressing, Total care Bathing Assistance: Limited assistance Feeding assistance: Independent Dressing Assistance: Limited assistance Total Care Assistance: Limited assistance   Functional Limitations Info  Sight, Hearing, Speech Sight Info: Adequate Hearing Info: Adequate Speech Info: Adequate    SPECIAL CARE FACTORS FREQUENCY  PT (By licensed PT), OT (By licensed OT)     PT Frequency: min 5x weekly OT  Frequency: min 5x weekly            Contractures Contractures Info: Not present    Additional Factors Info  Code Status, Allergies, Isolation Precautions Code Status Info: DNR Allergies Info: Adhesive (tape)     Isolation Precautions Info: COVID 19     Current Medications (11/25/2019):  This is the current hospital active medication list Current Facility-Administered Medications  Medication Dose Route Frequency Provider Last Rate Last Admin  . acetaminophen (TYLENOL) tablet 650 mg  650 mg Oral Q6H PRN Jesse [name], Jesse K, MD   650 mg at 11/25/19 1150  . albuterol (VENTOLIN HFA) 108 (90 Base) MCG/ACT inhaler 2 puff  2 puff Inhalation Q4H PRN Jesse Altes, MD   2 puff at 11/25/19 1153  . ascorbic acid (VITAMIN C) tablet 500 mg  500 mg Oral Daily Jesse [name], Jesse K, MD   500 mg at 11/25/19 1151  . chlordiazePOXIDE (LIBRIUM) capsule 10 mg  10 mg Oral Daily Jesse Altes, MD   10 mg at 11/25/19 1151  . Chlorhexidine Gluconate Cloth 2 % PADS 6 each  6 each Topical Daily Jesse [name], Jesse K, MD   6 each at 11/25/19 1152  . chlorpheniramine-HYDROcodone (TUSSIONEX) 10-8 MG/5ML suspension 5 mL  5 mL Oral Q12H PRN Jesse [name], Jesse K, MD      . cholecalciferol (VITAMIN D) tablet 1,000 Units  1,000 Units Oral Daily Jesse [name], Jesse K, MD   1,000 Units at 11/25/19 1151  . darifenacin (ENABLEX) 24 hr tablet 15 mg  15 mg Oral Daily Jesse [name], Jesse K, MD   15  mg at 11/25/19 1150  . enoxaparin (LOVENOX) injection 40 mg  40 mg Subcutaneous Q24H Jesse [name], Jesse K, MD   40 mg at 11/24/19 2050  . HYDROcodone-acetaminophen (NORCO/VICODIN) 5-325 MG per tablet 1 tablet  1 tablet Oral Q6H PRN Jesse Altes, MD      . multivitamin (PROSIGHT) tablet 1 tablet  1 tablet Oral BID Jesse [name], Jesse K, MD   1 tablet at 11/25/19 1152  . mupirocin ointment (BACTROBAN) 2 %   Nasal BID Jesse Corning, MD   Given at 11/25/19 1152  . pravastatin (PRAVACHOL) tablet 40 mg  40 mg Oral Daily Jesse [name], Jesse K, MD   40 mg at 11/25/19  1152  . tamsulosin (FLOMAX) capsule 0.4 mg  0.4 mg Oral BID Jesse [name], Jesse K, MD   0.4 mg at 11/25/19 1151  . thiamine tablet 100 mg  100 mg Oral Daily Jesse [name], Jesse K, MD   100 mg at 11/25/19 1151  . vitamin B-12 (CYANOCOBALAMIN) tablet 1,000 mcg  1,000 mcg Oral Daily Jesse Altes, MD   1,000 mcg at 11/25/19 1152  . zinc sulfate capsule 220 mg  220 mg Oral Daily Jesse [name], Jesse K, MD   220 mg at 11/25/19 1151     Discharge Medications: Please see discharge summary for a list of discharge medications.  Relevant Imaging Results:  Relevant Lab Results:   Additional Information [id number]  Jesse Sam, LCSW

## 2019-11-25 NOTE — Progress Notes (Signed)
Patient to go to SNF per orders. Team notes read. Will call report to facility number provided, team has updated family.  Patient ambulated with this RN standby assist and walker to chair. HR beginning sinus 90s, sinus tachy 120s, resting sinus take 110s. Patient denies CP/SOB, 2LNC. Patient given full bath, coccyx mepilex changed. Site cleansed, dried, barrier cream applied--without drainage. No other signs of skin breakdown present.   Patient denies pain. AO x3, forgetful at times but pleasant. Chair alarm audible, call bell in reach. Educated appropriately and made aware of plan of care and DC. No needs and questions answered appropriately.Patient ID band, high fall risk and DNR band on patient. Patient is extreme fall risk due to cognitive level and generalized weakness.   Marcie Bal RN Agency Nurse [phone]

## 2019-11-25 NOTE — Progress Notes (Signed)
   11/25/19 1420  Hand-Off documentation  Handoff Given Given to Transfer Unit/facility  Report given to (Full Name) Latoya Lumpkin LPN  Handoff Received Received from shift RN/LPN  Report received from (Full Name) Marcie Bal RN   Patient belongings all have patient sticker on outside bags.  Valuables include flip phone with charger (black) Bilateral hearing aids, in patients ears Glasses on patients face Bag of clothes, bag with shoes, bag with black wallet, black bag with papers and key card Patient made aware and agree all belongings are his own.   Vitals  AO x3  Denies pain 2LNC clear/dim  HR sinus tach 105 97/71 (80) RR 19 Chair alarm audible   Awaiting transport

## 2019-11-25 NOTE — TOC Transition Note (Addendum)
Transition of Care [facility]) - CM/SW Discharge Note   Patient Details  Name: DIMETRIUS LAZZARA MRN: [id number] Date of Birth: 1932/05/12  Transition of Care [facility]) CM/SW Contact:  Alberteen Sam, [place] Phone Number: [phone] 11/25/2019, 12:30 PM   Clinical Narrative:     Patient will DC to: [facility] Anticipated DC date: 11/25/19 Family notified: Addy (son) Transport by: Corey Harold  Per MD patient ready for DC to [place] . RN, patient, patient's family, and facility notified of DC. Discharge Summary sent to facility. RN given number for report  [phone] Room 921. DC packet on chart. Ambulance transport requested for patient.  CSW signing off.  [place], [place]   Final next level of care: Skilled Nursing Facility Barriers to Discharge: No Barriers Identified   Patient Goals and CMS Choice Patient states their goals for this hospitalization and ongoing recovery are:: to go to [place] CMS Medicare.gov Compare Post Acute Care list provided to:: Patient Choice offered to / list presented to : Patient  Discharge Placement PASRR number recieved: 11/25/19            Patient chooses bed at: [place] Patient to be transferred to facility by: [place] Name of family member notified: [place] (son) - lvm Patient and family notified of of transfer: 11/25/19  Discharge Plan and Services                                     Social Determinants of Health (SDOH) Interventions     Readmission Risk Interventions No flowsheet data found.

## 2019-11-25 NOTE — TOC Initial Note (Signed)
Transition of Care [facility]) - Initial/Assessment Note    Patient Details  Name: Jesse [name] MRN: [id number] Date of Birth: August 27, 1932  Transition of Care [facility]) CM/SW Contact:    Alberteen Sam, [place] Phone Number: [phone] 11/25/2019, 12:23 PM  Clinical Narrative:                  CSW spoke with patient via phone regarding discharge planning, he reports he is from [facility], however will be discharge to [facility] where his wife is, for short term rehab.   CSW called [place] [facility], they confirm above and report having bed avail today for patient. They have informed family as well.   Expected Discharge Plan: Skilled Nursing Facility Barriers to Discharge: No Barriers Identified   Patient Goals and CMS Choice Patient states their goals for this hospitalization and ongoing recovery are:: to go to [place] for rehab with his wife CMS Medicare.gov Compare Post Acute Care list provided to:: Patient Choice offered to / list presented to : Patient  Expected Discharge Plan and Services Expected Discharge Plan: [place]       Living arrangements for the past 2 months: Assisted Living Facility([facility]) Expected Discharge Date: 11/25/19                                    Prior Living Arrangements/Services Living arrangements for the past 2 months: Assisted Living Facility([facility]) Lives with:: Spouse Patient language and need for interpreter reviewed:: Yes Do you feel safe going back to the place where you live?: No   needs short term rehab  Need for Family Participation in Patient Care: Yes (Comment) Care giver support system in place?: Yes (comment)   Criminal Activity/Legal Involvement Pertinent to Current Situation/Hospitalization: No - Comment as needed  Activities of Daily Living Home Assistive Devices/Equipment: None ADL Screening (condition at time of  admission) Patient's cognitive ability adequate to safely complete daily activities?: Yes Is the patient deaf or have difficulty hearing?: Yes Does the patient have difficulty seeing, even when wearing glasses/contacts?: No Does the patient have difficulty concentrating, remembering, or making decisions?: No Patient able to express need for assistance with ADLs?: Yes Does the patient have difficulty dressing or bathing?: No Independently performs ADLs?: Yes (appropriate for developmental age) Does the patient have difficulty walking or climbing stairs?: Yes Weakness of Legs: Both Weakness of Arms/Hands: Both  Permission Sought/Granted Permission sought to share information with : Case Manager, Customer service manager, Family Supports Permission granted to share information with : Yes, Verbal Permission Granted  Share Information with NAME: [place]  Permission granted to share info w AGENCY: SNFs  Permission granted to share info w Relationship: son  Permission granted to share info w Contact Information: [phone]  Emotional Assessment Appearance:: Other (Comment Required(unable to assess- remote) Attitude/Demeanor/Rapport: Gracious Affect (typically observed): Calm Orientation: : Oriented to Place, Oriented to  Time, Oriented to Self, Oriented to Situation Alcohol / Substance Use: Not Applicable Psych Involvement: No (comment)  Admission diagnosis:  Acute respiratory failure ([place]) [J96.00] COVID-19 [U07.1] Acute respiratory failure due to COVID-19 ([place]) [U07.1, J96.00] Patient Active Problem List   Diagnosis Date Noted  . Acute respiratory failure due to COVID-19 ([place]) 11/20/2019  . BPH (benign prostatic hyperplasia) 11/20/2019  . History of prostate cancer 11/20/2019  . Acute respiratory failure ([place]) 11/20/2019   PCP:  Jani Gravel, MD Pharmacy:   [facility] [place], [address] [place] [address] Phone: [phone] Fax: [phone]     Social Determinants of Health (SDOH) Interventions    Readmission Risk Interventions No flowsheet data found.

## 2019-11-25 NOTE — Consult Note (Signed)
   [facility] CM Inpatient Consult   11/25/2019  SINA SLOANE III 04-Mar-1932 [id number]  Patient screened for disposition and transition needs in the Medicare [employer] [ACO]. Review of patient's medical record reveals patient is for a skilled  Nursing facility at [facility] for short term rehab.  This facility is not followed by our [facility] RN PAC. Admitted with COVID-19 positive.  No [facility]N follow up at this time.  For questions contact:   Natividad Brood, RN BSN [facility] Liaison  [phone] business mobile phone Toll free office [phone]  Fax number: [phone] [email or web address]

## 2019-11-25 NOTE — Discharge Summary (Signed)
[facility]  Physician Discharge Summary   Patient ID: Jesse [name] MRN: [id number] DOB/AGE: 05-02-1932 86 y.o.  Admit date: 11/20/2019 Discharge date: 11/25/2019  PCP: Jani Gravel, MD  DISCHARGE DIAGNOSES:  Pneumonia due to COVID-19 Acute respiratory failure with hypoxia, improved History of BPH History of prostate cancer Moderate protein calorie malnutrition Transaminitis  RECOMMENDATIONS FOR OUTPATIENT FOLLOW UP: 1. Outpatient follow-up with PCP. 2. Patient will need oxygen at discharge. 3. Check CBC and basic metabolic panel at least once a week   Home Health: Going to SNF Equipment/Devices: Oxygen  CODE STATUS: DNR  DISCHARGE CONDITION: fair  Diet recommendation: As before  INITIAL HISTORY: 86 year old with a history of BPH, prostate cancer, and anxiety who presented to the [facility] ED with shortness of breath.  He and his wife tested positive for Covid 1/14.  He also reported fevers, chills, loss of appetite, and severe generalized weakness.  EMS found his saturations to be 80% on room air.  Significant Events: 1/14 Covid test positive 1/17 admit via [facility] long ED 1/18 transfer to [facility]  COVID-19 specific Treatment: Decadron 1/17 > Remdesivir 1/17 > 1/21 Actemra 1/18 Convalescent plasma 1/18   HOSPITAL COURSE:   Pneumonia due to COVID-19/acute respiratory failure with hypoxia Patient was hospitalized and placed on remdesivir and steroids.  Patient was also given Actemra and convalescent plasma due to severity of his illness.  He has improved.  His inflammatory markers have improved.  His oxygen requirements came down from 10 L to 1 to 2 L of oxygen now.  He feels much better.  He was seen by PT and OT and they recommend skilled nursing facility for short-term rehab.  His wife is also apparently at a skilled nursing facility.  He is stable at this time for discharge.  Last Labs           Recent Labs  Lab 11/20/19 1202  11/20/19 1203 11/21/19 0219 11/22/19 0500 11/23/19 0542 11/24/19 0235  DDIMER 1.46*  --  1.38* 0.95* 1.08* 0.90*  FERRITIN  --  706* 875* 1,162* 860* 408*  CRP  --  25.2* 24.6* 15.4* 8.0* 3.6*  ALT 54*  --   --  94* 89* 66*  PROCALCITON 1.86  --   --   --   --   --       BPH Continue home medical therapy  History of prostate cancer  Moderate protein calorie malnutrition Encourage increased intake  Mild transaminitis Secondary to COVID-19.  Hypokalemia Repleted.  Pressure injury, unclear if present on admission Pressure Injury 11/21/19 Buttocks (Active)  11/21/19 0030  Location: Buttocks  Location Orientation:   Staging:   Wound Description (Comments):   Present on Admission:     Overall stable.  Okay for discharge to skilled nursing facility.   PERTINENT LABS:  The results of significant diagnostics from this hospitalization (including imaging, microbiology, ancillary and laboratory) are listed below for reference.    Microbiology: Recent Results (from the past 240 hour(s))  Blood Culture (routine x 2)     Status: None   Collection Time: 11/20/19 12:02 PM   Specimen: BLOOD  Result Value Ref Range Status   Specimen Description   Final    BLOOD BLOOD LEFT FOREARM Performed at [facility] [address] [place], [place] [address]    Special Requests   Final    BOTTLES DRAWN AEROBIC AND ANAEROBIC Blood Culture adequate volume Performed at [facility] [address]  [place], [place] [address]    Culture   Final    NO GROWTH 5 DAYS Performed at [facility], [place] [address], [place], [place] [address]    Report Status 11/25/2019 FINAL  Final  Blood Culture (routine x 2)     Status: None   Collection Time: 11/20/19 12:03 PM   Specimen: BLOOD  Result Value Ref Range Status   Specimen Description   Final    BLOOD BLOOD RIGHT FOREARM Performed at [facility] [address]  [place], [place] [address]    Special Requests   Final    BOTTLES DRAWN AEROBIC AND ANAEROBIC Blood Culture adequate volume Performed at [facility] [address] [place], [place] [address]    Culture   Final    NO GROWTH 5 DAYS Performed at [facility], [place] [address], [place], [place] [address]    Report Status 11/25/2019 FINAL  Final  SARS CORONAVIRUS 2 (TAT 6-24 HRS) Nasopharyngeal Nasopharyngeal Swab     Status: Abnormal   Collection Time: 11/20/19  1:21 PM   Specimen: Nasopharyngeal Swab  Result Value Ref Range Status   SARS Coronavirus 2 POSITIVE (A) NEGATIVE Final    Comment: RESULT CALLED TO, READ BACK BY AND VERIFIED WITH: Katha Cabal [id number] 11/21/2019 T. TYSOR (NOTE) SARS-CoV-2 target nucleic acids are DETECTED. The SARS-CoV-2 RNA is generally detectable in upper and lower respiratory specimens during the acute phase of infection. Positive results are indicative of the presence of SARS-CoV-2 RNA. Clinical correlation with patient history and other diagnostic information is  necessary to determine patient infection status. Positive results do not rule out bacterial infection or co-infection with other viruses.  The expected result is Negative. Fact Sheet for Patients: SugarRoll.be Fact Sheet for Healthcare Providers: https://www.woods-mathews.com/ This test is not yet approved or cleared by the [place] FDA and  has been authorized for detection and/or diagnosis of SARS-CoV-2 by FDA under an Emergency Use Authorization (EUA). This EUA will remain  in effect (meaning this test can be used) for t he duration of the COVID-19 declaration under Section 564(b)(1) of the Act, 21 U.S.C. section 360bbb-3(b)(1), unless the authorization is terminated or revoked sooner. Performed at [facility], [place] [address] [place], [place] [address]   MRSA PCR Screening     Status: Abnormal   Collection Time:  11/21/19  1:36 AM   Specimen: Nasal Mucosa; Nasopharyngeal  Result Value Ref Range Status   MRSA by PCR POSITIVE (A) NEGATIVE Final    Comment:        The GeneXpert MRSA Assay (FDA approved for NASAL specimens only), is one component of a comprehensive MRSA colonization surveillance program. It is not intended to diagnose MRSA infection nor to guide or monitor treatment for MRSA infections. RESULT CALLED TO, READ BACK BY AND VERIFIED WITH: ROTHERMEL, RN ON 11/20/21 @ 0331 BY LE Performed at [facility] [address] [place], [place] [address]      Labs:  COVID-19 Labs  Recent Labs    11/23/19 0542 11/24/19 0235 11/25/19 0104  DDIMER 1.08* 0.90* 0.67*  FERRITIN 860* 408* 316  CRP 8.0* 3.6* 2.0*    Lab Results  Component Value Date   SARSCOV2NAA POSITIVE (A) 11/20/2019      Basic Metabolic Panel: Recent Labs  Lab 11/20/19 1202 11/20/19 1202 11/20/19 2142 11/22/19 0500 11/23/19 0542 11/24/19 0235 11/25/19 0104  NA 137  --   --  141  142 141 144  K 3.4*  --   --  3.5 3.2* 3.7 3.6  CL 99  --   --  104 104 106 102  CO2 26  --   --  27 28 26 31   GLUCOSE 112*  --   --  151* 105* 104* 110*  BUN 44*  --   --  54* 50* 42* 39*  CREATININE 1.28*   < > 1.10 0.92 0.99 0.81 0.86  CALCIUM 8.4*  --   --  8.4* 8.6* 8.3* 9.3  MG  --   --   --   --   --  2.4  --    < > = values in this interval not displayed.   Liver Function Tests: Recent Labs  Lab 11/20/19 1202 11/22/19 0500 11/23/19 0542 11/24/19 0235 11/25/19 0104  AST 57* 99* 67* 39 37  ALT 54* 94* 89* 66* 64*  ALKPHOS 60 58 52 45 49  BILITOT 1.0 0.5 1.1 0.9 0.7  PROT 7.1 6.0* 6.4* 5.2* 5.5*  ALBUMIN 3.4* 2.7* 3.1* 2.7* 2.9*   CBC: Recent Labs  Lab 11/21/19 0219 11/22/19 0500 11/23/19 0542 11/24/19 0235 11/25/19 0104  WBC 11.2* 10.7* 5.3 3.9* 4.2  NEUTROABS 10.1* 8.7* 2.5 1.9 2.2  HGB 14.2 13.1 15.1 13.3 13.7  HCT 42.7 39.2 45.5 40.0 40.7  MCV 94.3 92.9 94.8 94.8 95.3   PLT 242 284 317 266 307    CBG: Recent Labs  Lab 11/21/19 1219  GLUCAP 132*     IMAGING STUDIES DG Chest Port 1 View  Result Date: 11/20/2019 CLINICAL DATA:  Shortness of breath. EXAM: PORTABLE CHEST 1 VIEW COMPARISON:  August 20, 2005. FINDINGS: The heart size and mediastinal contours are within normal limits. No pneumothorax or pleural effusion is noted. Faint bibasilar opacities are noted concerning for subsegmental atelectasis or possibly infiltrate. The visualized skeletal structures are unremarkable. IMPRESSION: Faint bibasilar opacities are noted concerning for subsegmental atelectasis or possibly infiltrate. Electronically Signed   By: Marijo Conception M.D.   On: 11/20/2019 13:17    DISCHARGE EXAMINATION: Vitals:   11/25/19 0700 11/25/19 0800 11/25/19 0900 11/25/19 1000  BP:  128/64    Pulse: 87 95 (!) 108 (!) 120  Resp: (!) 28 17 16 19   Temp:  98.7 F (37.1 C)    TempSrc:  Oral    SpO2: 90% 96% 94% 91%  Weight:      Height:       General appearance: Awake alert.  In no distress Resp: Clear to auscultation bilaterally.  Normal effort Cardio: S1-S2 is normal regular.  No S3-S4.  No rubs murmurs or bruit GI: Abdomen is soft.  Nontender nondistended.  Bowel sounds are present normal.  No masses organomegaly    DISPOSITION: SNF  Discharge Instructions    Call MD for:  difficulty breathing, headache or visual disturbances   Complete by: As directed    Call MD for:  extreme fatigue   Complete by: As directed    Call MD for:  persistant dizziness or light-headedness   Complete by: As directed    Call MD for:  persistant nausea and vomiting   Complete by: As directed    Call MD for:  severe uncontrolled pain   Complete by: As directed    Call MD for:  temperature >100.4   Complete by: As directed    Discharge instructions   Complete by: As directed    Please review instructions on the discharge  summary as well.  You were cared for by a hospitalist during your  hospital stay. If you have any questions about your discharge medications or the care you received while you were in the hospital after you are discharged, you can call the unit and asked to speak with the hospitalist on call if the hospitalist that took care of you is not available. Once you are discharged, your primary care physician will handle any further medical issues. Please note that NO REFILLS for any discharge medications will be authorized once you are discharged, as it is imperative that you return to your primary care physician (or establish a relationship with a primary care physician if you do not have one) for your aftercare needs so that they can reassess your need for medications and monitor your lab values. If you do not have a primary care physician, you can call [phone] for a physician referral.   Increase activity slowly   Complete by: As directed         Allergies as of 11/25/2019      Reactions   Adhesive [tape] Other (See Comments)   Tears skin off PAPER TAPE IS OKAY      Medication List    TAKE these medications   acetaminophen 325 MG tablet Commonly known as: TYLENOL Take 325 mg by mouth every 6 (six) hours as needed (for pain).   ascorbic acid 500 MG tablet Commonly known as: VITAMIN C Take 1 tablet (500 mg total) by mouth daily for 10 days.   benzonatate 100 MG capsule Commonly known as: Tessalon Perles Take 1 capsule (100 mg total) by mouth 3 (three) times daily as needed for cough.   chlordiazePOXIDE 10 MG capsule Commonly known as: LIBRIUM Take 1 capsule (10 mg total) by mouth daily. In the morning   HYDROcodone-acetaminophen 5-325 MG tablet Commonly known as: Norco Take 1 tablet by mouth every 6 (six) hours as needed for moderate pain.   pravastatin 40 MG tablet Commonly known as: PRAVACHOL Take 40 mg by mouth daily. In the morning.   solifenacin 10 MG tablet Commonly known as: VESICARE Take 10 mg by mouth daily. In the morning.    tamsulosin 0.4 MG Caps capsule Commonly known as: FLOMAX Take 0.4 mg by mouth daily.   thiamine 100 MG tablet Take 100 mg by mouth daily.   vitamin B-12 1000 MCG tablet Commonly known as: CYANOCOBALAMIN Take 1,000 mcg by mouth daily.   Vitamin D3 25 MCG (1000 UT) Caps Take 1,000 Units by mouth daily.   zinc sulfate 220 (50 Zn) MG capsule Take 1 capsule (220 mg total) by mouth daily for 10 days.            Durable Medical Equipment  (From admission, onward)         Start     Ordered   11/25/19 1039  DME Oxygen  Once    Question Answer Comment  Length of Need 6 Months   Mode or (Route) Nasal cannula   Liters per Minute 2   Frequency Continuous (stationary and portable oxygen unit needed)   Oxygen conserving device Yes   Oxygen delivery system Gas      11/25/19 1038   11/24/19 1130  DME Oxygen  (Discharge Planning)  Once    Question Answer Comment  Length of Need 6 Months   Mode or (Route) Nasal cannula   Liters per Minute 2   Frequency Continuous (stationary and portable oxygen unit needed)  Oxygen conserving device Yes   Oxygen delivery system Gas      11/24/19 1130           Follow-up Information    Jani Gravel, MD. Schedule an appointment as soon as possible for a visit in 2 week(s).   Specialty: Internal Medicine Contact information: [place]  [phone]           TOTAL DISCHARGE TIME: 16 minutes  [facility] Pager on [email or web address]  11/25/2019, 11:04 AM

## 2019-11-25 NOTE — Discharge Instructions (Signed)
COVID-19 COVID-19 is a respiratory infection that is caused by a virus called severe acute respiratory syndrome coronavirus 2 (SARS-CoV-2). The disease is also known as coronavirus disease or novel coronavirus. In some people, the virus may not cause any symptoms. In others, it may cause a serious infection. The infection can get worse quickly and can lead to complications, such as:  Pneumonia, or infection of the lungs.  Acute respiratory distress syndrome or ARDS. This is a condition in which fluid build-up in the lungs prevents the lungs from filling with air and passing oxygen into the blood.  Acute respiratory failure. This is a condition in which there is not enough oxygen passing from the lungs to the body or when carbon dioxide is not passing from the lungs out of the body.  Sepsis or septic shock. This is a serious bodily reaction to an infection.  Blood clotting problems.  Secondary infections due to bacteria or fungus.  Organ failure. This is when your body's organs stop working. The virus that causes COVID-19 is contagious. This means that it can spread from person to person through droplets from coughs and sneezes (respiratory secretions). What are the causes? This illness is caused by a virus. You may catch the virus by:  Breathing in droplets from an infected person. Droplets can be spread by a person breathing, speaking, singing, coughing, or sneezing.  Touching something, like a table or a doorknob, that was exposed to the virus (contaminated) and then touching your mouth, nose, or eyes. What increases the risk? Risk for infection You are more likely to be infected with this virus if you:  Are within 6 feet (2 meters) of a person with COVID-19.  Provide care for or live with a person who is infected with COVID-19.  Spend time in crowded indoor spaces or live in shared housing. Risk for serious illness You are more likely to become seriously ill from the virus if you:   Are 50 years of age or older. The higher your age, the more you are at risk for serious illness.  Live in a nursing home or long-term care facility.  Have cancer.  Have a long-term (chronic) disease such as: ? Chronic lung disease, including chronic obstructive pulmonary disease or asthma. ? A long-term disease that lowers your body's ability to fight infection (immunocompromised). ? Heart disease, including heart failure, a condition in which the arteries that lead to the heart become narrow or blocked (coronary artery disease), a disease which makes the heart muscle thick, weak, or stiff (cardiomyopathy). ? Diabetes. ? Chronic kidney disease. ? Sickle cell disease, a condition in which red blood cells have an abnormal "sickle" shape. ? Liver disease.  Are obese. What are the signs or symptoms? Symptoms of this condition can range from mild to severe. Symptoms may appear any time from 2 to 14 days after being exposed to the virus. They include:  A fever or chills.  A cough.  Difficulty breathing.  Headaches, body aches, or muscle aches.  Runny or stuffy (congested) nose.  A sore throat.  New loss of taste or smell. Some people may also have stomach problems, such as nausea, vomiting, or diarrhea. Other people may not have any symptoms of COVID-19. How is this diagnosed? This condition may be diagnosed based on:  Your signs and symptoms, especially if: ? You live in an area with a COVID-19 outbreak. ? You recently traveled to or from an area where the virus is common. ? You   provide care for or live with a person who was diagnosed with COVID-19. ? You were exposed to a person who was diagnosed with COVID-19.  A physical exam.  Lab tests, which may include: ? Taking a sample of fluid from the back of your nose and throat (nasopharyngeal fluid), your nose, or your throat using a swab. ? A sample of mucus from your lungs (sputum). ? Blood tests.  Imaging tests, which  may include, X-rays, CT scan, or ultrasound. How is this treated? At present, there is no medicine to treat COVID-19. Medicines that treat other diseases are being used on a trial basis to see if they are effective against COVID-19. Your health care provider will talk with you about ways to treat your symptoms. For most people, the infection is mild and can be managed at home with rest, fluids, and over-the-counter medicines. Treatment for a serious infection usually takes places in a hospital intensive care unit (ICU). It may include one or more of the following treatments. These treatments are given until your symptoms improve.  Receiving fluids and medicines through an IV.  Supplemental oxygen. Extra oxygen is given through a tube in the nose, a face mask, or a hood.  Positioning you to lie on your stomach (prone position). This makes it easier for oxygen to get into the lungs.  Continuous positive airway pressure (CPAP) or bi-level positive airway pressure (BPAP) machine. This treatment uses mild air pressure to keep the airways open. A tube that is connected to a motor delivers oxygen to the body.  Ventilator. This treatment moves air into and out of the lungs by using a tube that is placed in your windpipe.  Tracheostomy. This is a procedure to create a hole in the neck so that a breathing tube can be inserted.  Extracorporeal membrane oxygenation (ECMO). This procedure gives the lungs a chance to recover by taking over the functions of the heart and lungs. It supplies oxygen to the body and removes carbon dioxide. Follow these instructions at home: Lifestyle  If you are sick, stay home except to get medical care. Your health care provider will tell you how long to stay home. Call your health care provider before you go for medical care.  Rest at home as told by your health care provider.  Do not use any products that contain nicotine or tobacco, such as cigarettes, e-cigarettes, and  chewing tobacco. If you need help quitting, ask your health care provider.  Return to your normal activities as told by your health care provider. Ask your health care provider what activities are safe for you. General instructions  Take over-the-counter and prescription medicines only as told by your health care provider.  Drink enough fluid to keep your urine pale yellow.  Keep all follow-up visits as told by your health care provider. This is important. How is this prevented?  There is no vaccine to help prevent COVID-19 infection. However, there are steps you can take to protect yourself and others from this virus. To protect yourself:   Do not travel to areas where COVID-19 is a risk. The areas where COVID-19 is reported change often. To identify high-risk areas and travel restrictions, check the CDC travel website: wwwnc.cdc.gov/travel/notices  If you live in, or must travel to, an area where COVID-19 is a risk, take precautions to avoid infection. ? Stay away from people who are sick. ? Wash your hands often with soap and water for 20 seconds. If soap and water   are not available, use an alcohol-based hand sanitizer. ? Avoid touching your mouth, face, eyes, or nose. ? Avoid going out in public, follow guidance from your state and local health authorities. ? If you must go out in public, wear a cloth face covering or face mask. Make sure your mask covers your nose and mouth. ? Avoid crowded indoor spaces. Stay at least 6 feet (2 meters) away from others. ? Disinfect objects and surfaces that are frequently touched every day. This may include:  Counters and tables.  Doorknobs and light switches.  Sinks and faucets.  Electronics, such as phones, remote controls, keyboards, computers, and tablets. To protect others: If you have symptoms of COVID-19, take steps to prevent the virus from spreading to others.  If you think you have a COVID-19 infection, contact your health care  provider right away. Tell your health care team that you think you may have a COVID-19 infection.  Stay home. Leave your house only to seek medical care. Do not use public transport.  Do not travel while you are sick.  Wash your hands often with soap and water for 20 seconds. If soap and water are not available, use alcohol-based hand sanitizer.  Stay away from other members of your household. Let healthy household members care for children and pets, if possible. If you have to care for children or pets, wash your hands often and wear a mask. If possible, stay in your own room, separate from others. Use a different bathroom.  Make sure that all people in your household wash their hands well and often.  Cough or sneeze into a tissue or your sleeve or elbow. Do not cough or sneeze into your hand or into the air.  Wear a cloth face covering or face mask. Make sure your mask covers your nose and mouth. Where to find more information  Centers for Disease Control and Prevention: www.cdc.gov/coronavirus/2019-ncov/index.html  World Health Organization: www.who.int/health-topics/coronavirus Contact a health care provider if:  You live in or have traveled to an area where COVID-19 is a risk and you have symptoms of the infection.  You have had contact with someone who has COVID-19 and you have symptoms of the infection. Get help right away if:  You have trouble breathing.  You have pain or pressure in your chest.  You have confusion.  You have bluish lips and fingernails.  You have difficulty waking from sleep.  You have symptoms that get worse. These symptoms may represent a serious problem that is an emergency. Do not wait to see if the symptoms will go away. Get medical help right away. Call your local emergency services (911 in the [place] Do not drive yourself to the hospital. Let the emergency medical personnel know if you think you have COVID-19. Summary  COVID-19 is a  respiratory infection that is caused by a virus. It is also known as coronavirus disease or novel coronavirus. It can cause serious infections, such as pneumonia, acute respiratory distress syndrome, acute respiratory failure, or sepsis.  The virus that causes COVID-19 is contagious. This means that it can spread from person to person through droplets from breathing, speaking, singing, coughing, or sneezing.  You are more likely to develop a serious illness if you are 50 years of age or older, have a weak immune system, live in a nursing home, or have chronic disease.  There is no medicine to treat COVID-19. Your health care provider will talk with you about ways to treat your symptoms.    Take steps to protect yourself and others from infection. Wash your hands often and disinfect objects and surfaces that are frequently touched every day. Stay away from people who are sick and wear a mask if you are sick. This information is not intended to replace advice given to you by your health care provider. Make sure you discuss any questions you have with your health care provider. Document Revised: 08/19/2019 Document Reviewed: 11/25/2018 Elsevier Patient Education  2020 [employer] Inc.  

## 2019-11-28 ENCOUNTER — Encounter: Payer: Self-pay | Admitting: Nurse Practitioner

## 2019-11-28 ENCOUNTER — Other Ambulatory Visit: Payer: Self-pay | Admitting: Nurse Practitioner

## 2019-11-28 ENCOUNTER — Non-Acute Institutional Stay (SKILLED_NURSING_FACILITY): Payer: Medicare Other | Admitting: Nurse Practitioner

## 2019-11-28 DIAGNOSIS — U071 COVID-19: Secondary | ICD-10-CM

## 2019-11-28 DIAGNOSIS — N400 Enlarged prostate without lower urinary tract symptoms: Secondary | ICD-10-CM

## 2019-11-28 DIAGNOSIS — J9600 Acute respiratory failure, unspecified whether with hypoxia or hypercapnia: Secondary | ICD-10-CM

## 2019-11-28 DIAGNOSIS — R630 Anorexia: Secondary | ICD-10-CM | POA: Diagnosis not present

## 2019-11-28 DIAGNOSIS — F419 Anxiety disorder, unspecified: Secondary | ICD-10-CM | POA: Diagnosis not present

## 2019-11-28 DIAGNOSIS — I959 Hypotension, unspecified: Secondary | ICD-10-CM

## 2019-11-28 NOTE — Assessment & Plan Note (Signed)
Urination x1 last night, will hold Flomax in stetting of low Bp, continue Vesicare, observe.

## 2019-11-28 NOTE — Assessment & Plan Note (Signed)
No wheezes or O2 desaturation, encourage incentive spirometer use.

## 2019-11-28 NOTE — Assessment & Plan Note (Signed)
11/28/19 low BP since admission to SNF [facility], changed Flomax to pm, denied dizziness or headache. Generalized weakness, decreased oral intake, COVID PNA are contributory. Will hold Flomax, update CBC/diff, CMP/eGFR 11/29/19.

## 2019-11-28 NOTE — Assessment & Plan Note (Signed)
His mood is stable, continue Librium.  

## 2019-11-28 NOTE — Progress Notes (Signed)
Location:  [facility] Room Number: [place] of Service:  SNF (31) Provider:  Julane Crock Otho Darner, NP   Jani Gravel, MD  Patient Care Team: Jani Gravel, MD as PCP - General (Internal Medicine)  Extended Emergency Contact Information Primary Emergency Contact: [place] Phone: [phone] Relation: Son Secondary Emergency Contact: Cranford,Ann Address: [address], [place]          [place], [place] [address] Johnnette Litter of [place] Phone: [phone] Relation: Spouse  Code Status: FULL Goals of care: Advanced Directive information Advanced Directives 11/28/2019  Does Patient Have a Medical Advance Directive? No  Type of Advance Directive -  Does patient want to make changes to medical advance directive? -  Copy of [place] in Chart? -  Would patient like information on creating a medical advance directive? No - Patient declined     Chief Complaint  Patient presents with  . Acute Visit    Low Blood Pressure    HPI:  Pt is a 86 y.o. male seen today for an acute visit for has low Bp since admitted to SNF [facility], he denied dizziness, chest pain/pressure, palpitation, nausea, vomiting, constipation/diarrhea. He admitted his poor appetite and fatigue since onset of COVID positive 11/17/19. Hospitalized 11/20/19-11/25/19 for COVID PNA, placed on Vit D, zinc, Vit C. treated with Decadron, Remdesvir, Actemra 11/21/19, Convalescent Plasma 11/21/19. Fever and  Hypoxia resolved. Hx of BPH, on Flomax and Vesicare. Hx of anxiety, stable, on Librium,  104m qd.    Past Medical History:  Diagnosis Date  . Anxiety   . Asthma    as child  . Bladder cancer ([facility] urologist-  dr rElliot Gault([facility] [place]   dx 01/ 2016--- post TURBT and post Bladder bx 02-10-2017  . Bladder tumor   . BPH (benign prostatic hyperplasia)   . Frequency of urination   . History of asthma    childhood  .  History of external beam radiation therapy    2002  prostate/ pelvis and boost w/ radioactive prostate seed implants   . History of hypercalcemia    s/p  parathyroidectomy 2003  . History of prostate cancer current urologist-  [facility]iot [facility] [place] per lov note (care everywhere) last PSA undetectable   dx 2002--  urologist-- dr dahlstedt/ oncologist dr mValere Dross--  Gleason 7 out of 10, PSA 4.9---post Radioactive Prostate Seed Implant and External Beam Radiation therapy  . History of skin cancer   . Hyperlipidemia   . Multiple lung nodules    since 2006--- followed by pcp --- dr kMaudie Mercury . Renal cyst, left   . Wears glasses   . Wears hearing aid in both ears    Past Surgical History:  Procedure Laterality Date  . CARDIOVASCULAR STRESS TEST  01/01/2004   normal nuclear perfustion study w/ no ischemia/  normal LV function and wall motion, ef 65%  . CATARACT EXTRACTION W/ INTRAOCULAR LENS  IMPLANT, BILATERAL  2010  . COLONOSCOPY    . CYSTO/  BILATERAL RETROGRADE PYELOGRAM/  BLADDER BX'S AND FULGERATION  02-10-2017   [facility] [place] . CYSTOSCOPY W/ RETROGRADES Bilateral 07/27/2017   Procedure: CYSTOSCOPY,SELECTIVE CYTOLOGIES,RETROGRADE PYELOGRAM;  Surgeon: MCleon Gustin MD;  [facility]  Service: Urology;  Laterality: Bilateral;  . CYSTOSCOPY W/ RETROGRADES Bilateral 12/08/2017   Procedure: CYSTOSCOPY WITH RETROGRADE PYELOGRAM;  Surgeon: MCleon Gustin  MD;  Location: [place];  Service: Urology;  Laterality: Bilateral;  . CYSTOSCOPY WITH BIOPSY N/A 07/27/2017   Procedure: CYSTOSCOPY WITH BIOPSY OF BLADDER AND PROSTATIC URETHRA;  Surgeon: Cleon Gustin, MD;  Location[facility];  Service: Urology;  Laterality: N/A;  . CYSTOSCOPY WITH BIOPSY Bilateral 12/08/2017   Procedure: CYSTOSCOPY WITH RENAL WASHINGS;  Surgeon: Cleon Gustin, MD;  Location: [facility];  Service: Urology;  Laterality: Bilateral;  . FIBEROPTIC BRONCHOSCOPY  08-20-2005   dr wert   w/ Left lower lobe bx  . PARATHYROIDECTOMY  2003  . RADIOACTIVE PROSTATE SEED IMPLANTS  04-05-2001    dr Diona Fanti [facility]  . TONSILLECTOMY  child  . TRANSURETHRAL RESECTION OF BLADDER TUMOR  11-24-2014   dr Anabel Bene [facility] in [place], [place]  . TRANSURETHRAL RESECTION OF BLADDER TUMOR N/A 04/08/2018   Procedure: TRANSURETHRAL RESECTION OF BLADDER TUMOR (TURBT);  Surgeon: Cleon Gustin, MD;  Location: [facility];  Service: Urology;  Laterality: N/A;  . TRANSURETHRAL RESECTION OF PROSTATE N/A 02/28/2019   Procedure: TRANSURETHRAL RESECTION OF THE PROSTATE (TURP);  Surgeon: Cleon Gustin, MD;  Location: [facility];  Service: Urology;  Laterality: N/A;  30 MINS  . UPPER GI ENDOSCOPY      Allergies  Allergen Reactions  . Adhesive [Tape] Other (See Comments)    Tears skin off PAPER TAPE IS OKAY    Outpatient Encounter Medications as of 11/28/2019  Medication Sig  . acetaminophen (TYLENOL) 325 MG tablet Take 325 mg by mouth every 6 (six) hours as needed (for pain).  Marland Kitchen ascorbic acid (VITAMIN C) 500 MG tablet Take 1 tablet (500 mg total) by mouth daily for 10 days.  . benzonatate (TESSALON PERLES) 100 MG capsule Take 1 capsule (100 mg total) by mouth 3 (three) times daily as needed for cough.  . chlordiazePOXIDE (LIBRIUM) 10 MG capsule Take 1 capsule (10 mg total) by mouth daily. In the morning  . Cholecalciferol (VITAMIN D3) 1000 units CAPS Take 1,000 Units by mouth daily.  . folic acid (FOLVITE) 097 MCG tablet Take 400 mcg by mouth daily.  Marland Kitchen HYDROcodone-acetaminophen (NORCO) 5-325 MG tablet Take 1 tablet by mouth every 6 (six) hours as needed for moderate pain.  . mirtazapine (REMERON) 15 MG tablet Take 7.5 mg by mouth at bedtime.  . NON FORMULARY apply Vaseline to buttocks after each incontinent episode Every Shift  . OXYGEN Inhale 2 L into  the lungs continuous. OXYGEN AT 2L/[place] TO KEEP SATS EQUAL TO OR GREATER THAN 88% Every Shift  . pravastatin (PRAVACHOL) 40 MG tablet Take 40 mg by mouth daily. In the morning.  . solifenacin (VESICARE) 10 MG tablet Take 10 mg by mouth daily. In the morning.  . tamsulosin (FLOMAX) 0.4 MG CAPS capsule Take 0.4 mg by mouth daily.   Marland Kitchen thiamine 100 MG tablet Take 100 mg by mouth daily.  . vitamin B-12 (CYANOCOBALAMIN) 1000 MCG tablet Take 1,000 mcg by mouth daily.  Marland Kitchen zinc sulfate 220 (50 Zn) MG capsule Take 1 capsule (220 mg total) by mouth daily for 10 days.   No facility-administered encounter medications on file as of 11/28/2019.    Review of Systems  Constitutional: Positive for activity change, appetite change and fatigue. Negative for chills, diaphoresis and fever.       #2Ibs weight loss in the past 10 days.   HENT: Positive for hearing loss. Negative for congestion and voice change.  Respiratory: Positive for shortness of breath. Negative for cough and wheezing.        DOE  Cardiovascular: Negative for chest pain, palpitations and leg swelling.  Gastrointestinal: Negative for abdominal distention, abdominal pain, constipation, diarrhea, nausea and vomiting.  Genitourinary: Negative for difficulty urinating, dysuria and urgency.  Musculoskeletal: Positive for gait problem.  Skin: Negative for color change and pallor.  Neurological: Negative for dizziness, speech difficulty, weakness and headaches.  Psychiatric/Behavioral: Negative for agitation, behavioral problems, hallucinations and sleep disturbance. The patient is not nervous/anxious.     Immunization History  Administered Date(s) Administered  . Influenza,inj,Quad PF,6+ Mos 08/05/2018   Pertinent  Health Maintenance Due  Topic Date Due  . PNA vac Low Risk Adult (1 of 2 - PCV13) 04/22/1999  . INFLUENZA VACCINE  06/04/2019   No flowsheet data found. Functional Status Survey:    Vitals:   11/28/19 1155  BP: (!) 90/54    Pulse: 96  Resp: 18  Temp: (!) 97.2 F (36.2 C)  TempSrc: Oral  SpO2: 92%  Weight: 118 lb (53.5 kg)  Height: 6' (1.829 m)   Body mass index is 16 kg/m. Physical Exam Vitals and nursing note reviewed.  Constitutional:      General: He is not in acute distress.    Appearance: Normal appearance. He is not ill-appearing, toxic-appearing or diaphoretic.     Comments: Under weight.   HENT:     Head: Normocephalic and atraumatic.     Nose: Nose normal.     Mouth/Throat:     Mouth: Mucous membranes are moist.  Eyes:     Extraocular Movements: Extraocular movements intact.     Conjunctiva/sclera: Conjunctivae normal.     Pupils: Pupils are equal, round, and reactive to light.  Cardiovascular:     Rate and Rhythm: Normal rate and regular rhythm.     Heart sounds: No murmur.  Pulmonary:     Breath sounds: No wheezing, rhonchi or rales.  Abdominal:     General: Bowel sounds are normal. There is no distension.     Palpations: Abdomen is soft.     Tenderness: There is no abdominal tenderness. There is no right CVA tenderness, left CVA tenderness, guarding or rebound.  Musculoskeletal:     Cervical back: Normal range of motion and neck supple.     Right lower leg: No edema.     Left lower leg: No edema.  Skin:    General: Skin is warm and dry.  Neurological:     General: No focal deficit present.     Mental Status: He is alert and oriented to person, place, and time. Mental status is at baseline.     Motor: No weakness.     Coordination: Coordination normal.     Gait: Gait abnormal.  Psychiatric:        Mood and Affect: Mood normal.        Behavior: Behavior normal.        Thought Content: Thought content normal.        Judgment: Judgment normal.     Labs reviewed: Recent Labs    11/23/19 0542 11/24/19 0235 11/25/19 0104  NA 142 141 144  K 3.2* 3.7 3.6  CL 104 106 102  CO2 _0 GLUCOSE 105* 104* 110*  BUN 50* 42* 39*  CREATININE 0.99 0.81 0.86  CALCIUM 8.6*  8.3* 9.3  MG  --  2.4  --    Recent Labs    11/23/19  0542 11/24/19 0235 11/25/19 0104  AST 67* 39 37  ALT 89* 66* 64*  ALKPHOS 52 45 49  BILITOT 1.1 0.9 0.7  PROT 6.4* 5.2* 5.5*  ALBUMIN 3.1* 2.7* 2.9*   Recent Labs    11/23/19 0542 11/24/19 0235 11/25/19 0104  WBC 5.3 3.9* 4.2  NEUTROABS 2.5 1.9 2.2  HGB 15.1 13.3 13.7  HCT 45.5 40.0 40.7  MCV 94.8 94.8 95.3  PLT 317 266 307   No results found for: TSH No results found for: HGBA1C Lab Results  Component Value Date   TRIG 91 11/20/2019    Significant Diagnostic Results in last 30 days:  DG Chest Port 1 View  Result Date: 11/20/2019 CLINICAL DATA:  Shortness of breath. EXAM: PORTABLE CHEST 1 VIEW COMPARISON:  August 20, 2005. FINDINGS: The heart size and mediastinal contours are within normal limits. No pneumothorax or pleural effusion is noted. Faint bibasilar opacities are noted concerning for subsegmental atelectasis or possibly infiltrate. The visualized skeletal structures are unremarkable. IMPRESSION: Faint bibasilar opacities are noted concerning for subsegmental atelectasis or possibly infiltrate. Electronically Signed   By: Marijo Conception M.D.   On: 11/20/2019 13:17    Assessment/Plan Anxiety His mood is stable, continue Librium  Poor appetite Related to COVID PNA, adding Mirtazapine 7.76m qd, encourage oral intake and activity as tolerated.   Hypotension 11/28/19 low BP since admission to SNF [facility], changed Flomax to pm, denied dizziness or headache. Generalized weakness, decreased oral intake, COVID PNA are contributory. Will hold Flomax, update CBC/diff, CMP/eGFR 11/29/19.   Acute respiratory failure due to COVID-19 (HCC) No wheezes or O2 desaturation, encourage incentive spirometer use.   BPH (benign prostatic hyperplasia) Urination x1 last night, will hold Flomax in stetting of low Bp, continue Vesicare, observe.      Family/ staff Communication: plan of care reviewed with the patient and charge  nurse.   Labs/tests ordered: CBC/diff, CMP/eGFR 11/29/19  Time spend 35 minutes.

## 2019-11-28 NOTE — Assessment & Plan Note (Signed)
Related to COVID PNA, adding Mirtazapine 7.5mg  qd, encourage oral intake and activity as tolerated.

## 2019-11-29 ENCOUNTER — Encounter: Payer: Self-pay | Admitting: Internal Medicine

## 2019-11-29 ENCOUNTER — Other Ambulatory Visit: Payer: Self-pay | Admitting: Internal Medicine

## 2019-11-29 ENCOUNTER — Non-Acute Institutional Stay (SKILLED_NURSING_FACILITY): Payer: Medicare Other | Admitting: Internal Medicine

## 2019-11-29 DIAGNOSIS — U071 COVID-19: Secondary | ICD-10-CM

## 2019-11-29 DIAGNOSIS — R630 Anorexia: Secondary | ICD-10-CM | POA: Diagnosis not present

## 2019-11-29 DIAGNOSIS — F419 Anxiety disorder, unspecified: Secondary | ICD-10-CM

## 2019-11-29 DIAGNOSIS — N400 Enlarged prostate without lower urinary tract symptoms: Secondary | ICD-10-CM | POA: Diagnosis not present

## 2019-11-29 DIAGNOSIS — J9600 Acute respiratory failure, unspecified whether with hypoxia or hypercapnia: Secondary | ICD-10-CM

## 2019-11-29 DIAGNOSIS — J1282 Pneumonia due to coronavirus disease 2019: Secondary | ICD-10-CM

## 2019-11-29 MED ORDER — CHLORDIAZEPOXIDE HCL 10 MG PO CAPS: 10.0000 mg | ORAL_CAPSULE | Freq: Every day | ORAL | 0 refills | Status: DC

## 2019-11-29 NOTE — Progress Notes (Signed)
Provider:  Veleta Miners MD Location:   [place] Room Number: [id number] Place of Service:  SNF [phone])  PCP: Jani Gravel, MD Patient Care Team: Jani Gravel, MD as PCP - General (Internal Medicine)  Extended Emergency Contact Information Primary Emergency Contact: Cranford,Izrael  [place] Phone: [phone] Relation: Son Secondary Emergency Contact: Cranford,Ann Address: [address] [place]          [place], [place] [address] Johnnette Litter of [place] Phone: [phone] Relation: Spouse  Code Status: Full Code Goals of Care: Advanced Directive information Advanced Directives 11/29/2019  Does Patient Have a Medical Advance Directive? No  Type of Advance Directive -  Does patient want to make changes to medical advance directive? -  Copy of [place] in Chart? -  Would patient like information on creating a medical advance directive? -      Chief Complaint  Patient presents with  . New Admit To SNF    Admission  . Health Maintenance    PCV13, influenza vaccine, covid 19 vaccine    HPI: Patient is a 86 y.o. male seen today for admission to SNF for  Therapy and Monitoring  Patient was admitted in the hospital from 01/17-01/22 for acute respiratory failure due to Covid Patient has history of bladder cancer, prostate cancer and anxiety  He and his wife both got diagnosed with Covid on 01/14 .  They went to the hospital.  There he was admitted for shortness of breath fever chills loss of appetite and weakness. At one time he was needed 10 L of oxygen.  He was treated with Decadron, remdesivir, Actemra and convalescent plasma. His Xray showed possible infiltrate Patient did respond well to the above treatment and is now in SNF for therapy for further monitoring. He is doing very well .Is off his oxygen.  Continues to be little weak.. Walking with mild assist. Denies any nausea vomiting or diarrhea He does  say that he has lost almost 15 pounds since his diagnosis  [place]  Past Medical History:  Diagnosis Date  . Anxiety   . Asthma    as child  . Bladder cancer [facility]) urologist-  dr Elliot Gault [facility] in [place], [place])   dx 01/ 2016--- post TURBT and post Bladder bx 02-10-2017  . Bladder tumor   . BPH (benign prostatic hyperplasia)   . Frequency of urination   . History of asthma    childhood  . History of external beam radiation therapy    2002  prostate/ pelvis and boost w/ radioactive prostate seed implants   . History of hypercalcemia    s/p  parathyroidectomy 2003  . History of prostate cancer current urologist-  dr Elliot Gault [facility] in [place], [place])-- per lov note (care everywhere) last PSA undetectable   dx 2002--  urologist-- dr dahlstedt/ oncologist dr Valere Dross---  Gleason 7 out of 10, PSA 4.9---post Radioactive Prostate Seed Implant and External Beam Radiation therapy  . History of skin cancer   . Hyperlipidemia   . Multiple lung nodules    since 2006--- followed by pcp --- dr Maudie Mercury  . Renal cyst, left   . Wears glasses   . Wears hearing aid in both ears    Past Surgical History:  Procedure Laterality Date  . CARDIOVASCULAR STRESS TEST  01/01/2004   normal nuclear perfustion study w/ no ischemia/  normal LV  function and wall motion, ef 65%  . CATARACT EXTRACTION W/ INTRAOCULAR LENS  IMPLANT, BILATERAL  2010  . COLONOSCOPY    . CYSTO/  BILATERAL RETROGRADE PYELOGRAM/  BLADDER BX'S AND FULGERATION  02-10-2017   dr Anabel Bene [facility] in [place], [place]  . CYSTOSCOPY W/ RETROGRADES Bilateral 07/27/2017   Procedure: CYSTOSCOPY,SELECTIVE CYTOLOGIES,RETROGRADE PYELOGRAM;  Surgeon: Cleon Gustin, MD;  [facility];  Service: Urology;  Laterality: Bilateral;  . CYSTOSCOPY W/ RETROGRADES Bilateral 12/08/2017   Procedure: CYSTOSCOPY WITH RETROGRADE PYELOGRAM;  Surgeon: Cleon Gustin, MD;  Location: [facility];  Service: Urology;  Laterality: Bilateral;  . CYSTOSCOPY WITH BIOPSY N/A 07/27/2017   Procedure: CYSTOSCOPY WITH BIOPSY OF BLADDER AND PROSTATIC URETHRA;  Surgeon: Cleon Gustin, MD;  Location[facility];  Service: Urology;  Laterality: N/A;  . CYSTOSCOPY WITH BIOPSY Bilateral 12/08/2017   Procedure: CYSTOSCOPY WITH RENAL WASHINGS;  Surgeon: Cleon Gustin, MD;  Location: [facility];  Service: Urology;  Laterality: Bilateral;  . FIBEROPTIC BRONCHOSCOPY  08-20-2005   dr wert   w/ Left lower lobe bx  . PARATHYROIDECTOMY  2003  . RADIOACTIVE PROSTATE SEED IMPLANTS  04-05-2001    dr Diona Fanti [facility]  . TONSILLECTOMY  child  . TRANSURETHRAL RESECTION OF BLADDER TUMOR  11-24-2014   dr Anabel Bene [facility] in [place], [place]  . TRANSURETHRAL RESECTION OF BLADDER TUMOR N/A 04/08/2018   Procedure: TRANSURETHRAL RESECTION OF BLADDER TUMOR (TURBT);  Surgeon: Cleon Gustin, MD;  Location: [facility];  Service: Urology;  Laterality: N/A;  . TRANSURETHRAL RESECTION OF PROSTATE N/A 02/28/2019   Procedure: TRANSURETHRAL RESECTION OF THE PROSTATE (TURP);  Surgeon: Cleon Gustin, MD;  Location: [facility];  Service: Urology;  Laterality: N/A;  30 MINS  . UPPER GI ENDOSCOPY      reports that he quit smoking about 35 years ago. His smoking use included cigarettes. He quit after 50.00 years of use. He has never used smokeless tobacco. He reports current alcohol use of about 7.0 standard drinks of alcohol per week. He reports that he does not use drugs. Social History   Socioeconomic History  . Marital status: Married    Spouse name: Not on file  . Number of children: Not on file  . Years of education: Not on file  . Highest education level: Not on file  Occupational History  . Not on file  Tobacco Use  . Smoking status: Former Smoker    Years: 50.00    Types: Cigarettes     Quit date: 11/03/1984    Years since quitting: 35.0  . Smokeless tobacco: Never Used  Substance and Sexual Activity  . Alcohol use: Yes    Alcohol/week: 7.0 standard drinks    Types: 7 Glasses of wine per week    Comment: daily wine  . Drug use: No  . Sexual activity: Not Currently  Other Topics Concern  . Not on file  Social History Narrative  . Not on file   Social Determinants of Health   Financial Resource Strain:   . Difficulty of Paying Living Expenses: Not on file  Food Insecurity:   . Worried About Charity fundraiser in the Last Year: Not on file  . Ran Out of Food in the Last Year: Not on file  Transportation Needs:   . Lack of Transportation (Medical): Not on file  . Lack of Transportation (Non-Medical): Not on file  Physical  Activity:   . Days of Exercise per Week: Not on file  . Minutes of Exercise per Session: Not on file  Stress:   . Feeling of Stress : Not on file  Social Connections:   . Frequency of Communication with Friends and Family: Not on file  . Frequency of Social Gatherings with Friends and Family: Not on file  . Attends Religious Services: Not on file  . Active Member of Clubs or Organizations: Not on file  . Attends Archivist Meetings: Not on file  . Marital Status: Not on file  Intimate Partner Violence:   . Fear of Current or Ex-Partner: Not on file  . Emotionally Abused: Not on file  . Physically Abused: Not on file  . Sexually Abused: Not on file    Functional Status Survey:    History reviewed. No pertinent family history.  Health Maintenance  Topic Date Due  . TETANUS/TDAP  04/[birth year]/1953  . PNA vac Low Risk Adult (1 of 2 - PCV13) 04/22/1999  . INFLUENZA VACCINE  06/04/2019    Allergies  Allergen Reactions  . Adhesive [Tape] Other (See Comments)    Tears skin off PAPER TAPE IS OKAY    Allergies as of 11/29/2019      Reactions   Adhesive [tape] Other (See Comments)   Tears skin off PAPER TAPE IS OKAY        Medication List       Accurate as of November 29, 2019  9:42 AM. If you have any questions, ask your nurse or doctor.        STOP taking these medications   HYDROcodone-acetaminophen 5-325 MG tablet Commonly known as: Norco Stopped by: Virgie Dad, MD     TAKE these medications   acetaminophen 325 MG tablet Commonly known as: TYLENOL Take 325 mg by mouth every 6 (six) hours as needed (for pain).   ascorbic acid 500 MG tablet Commonly known as: VITAMIN C Take 1 tablet (500 mg total) by mouth daily for 10 days.   benzonatate 100 MG capsule Commonly known as: Tessalon Perles Take 1 capsule (100 mg total) by mouth 3 (three) times daily as needed for cough.   chlordiazePOXIDE 10 MG capsule Commonly known as: LIBRIUM Take 1 capsule (10 mg total) by mouth daily. In the morning   folic acid A999333 MCG tablet Commonly known as: FOLVITE Take 400 mcg by mouth daily.   mirtazapine 15 MG tablet Commonly known as: REMERON Take 7.5 mg by mouth at bedtime.   NON FORMULARY apply Vaseline to buttocks after each incontinent episode Every Shift   OXYGEN Inhale 2 L into the lungs continuous. OXYGEN AT 2L/Yell TO KEEP SATS EQUAL TO OR GREATER THAN 88% Every Shift   pravastatin 40 MG tablet Commonly known as: PRAVACHOL Take 40 mg by mouth daily. In the morning.   solifenacin 10 MG tablet Commonly known as: VESICARE Take 10 mg by mouth daily. In the morning.   thiamine 100 MG tablet Take 100 mg by mouth daily.   Tubersol 5 UNIT/0.1ML injection Generic drug: tuberculin Inject into the skin once. Start taking on: December 04, 2019   vitamin B-12 1000 MCG tablet Commonly known as: CYANOCOBALAMIN Take 1,000 mcg by mouth daily.   Vitamin D3 25 MCG (1000 UT) Caps Take 1,000 Units by mouth daily.   zinc sulfate 220 (50 Zn) MG capsule Take 1 capsule (220 mg total) by mouth daily for 10 days.       Review  of Systems  Review of Systems  Constitutional: Negative for , chills,  diaphoresis, fatigue and fever.  HENT: Negative for mouth sores, postnasal drip, rhinorrhea, sinus pain and sore throat.   Respiratory: Negative for apnea, cough, chest tightness, shortness of breath and wheezing.   Cardiovascular: Negative for chest pain, palpitations and leg swelling.  Gastrointestinal: Negative for abdominal distention, abdominal pain, constipation, diarrhea, nausea and vomiting.  Genitourinary: Negative for dysuria and frequency.  Musculoskeletal: Negative for arthralgias, joint swelling and myalgias.  Skin: Negative for rash.  Neurological: Negative for dizziness, syncope, weakness, light-headedness and numbness.  Psychiatric/Behavioral: Negative for behavioral problems, confusion and sleep disturbance.     Vitals:   11/29/19 0936  BP: (!) 98/56  Pulse: 88  Resp: 20  Temp: 98.9 F (37.2 C)  SpO2: 93%  Weight: 118 lb (53.5 kg)  Height: 6' (1.829 m)   Body mass index is 16 kg/m. Physical Exam Constitutional: Oriented to person, place, and time. Well-developed and well-nourished.  HENT:  Head: Normocephalic.  Mouth/Throat: Oropharynx is clear and dry Eyes: Pupils are equal, round, and reactive to light.  Neck: Neck supple.  Cardiovascular: Normal rate and normal heart sounds.  No murmur heard. Pulmonary/Chest: Effort normal and breath sounds normal. No respiratory distress. No wheezes. She has no rales.  Abdominal: Soft. Bowel sounds are normal. No distension. There is no tenderness. There is no rebound.  Musculoskeletal: No edema.  Lymphadenopathy: none Neurological: Alert and oriented to person, place, and time.  No focal deficit Skin: Skin is warm and dry.  Psychiatric: Normal mood and affect. Behavior is normal. Thought content normal.   Labs reviewed: Basic Metabolic Panel: Recent Labs    11/23/19 0542 11/24/19 0235 11/25/19 0104  NA 142 141 144  K 3.2* 3.7 3.6  CL 104 106 102  CO2 28 26 31   GLUCOSE 105* 104* 110*  BUN 50* 42* 39*    CREATININE 0.99 0.81 0.86  CALCIUM 8.6* 8.3* 9.3  MG  --  2.4  --    Liver Function Tests: Recent Labs    11/23/19 0542 11/24/19 0235 11/25/19 0104  AST 67* 39 37  ALT 89* 66* 64*  ALKPHOS 52 45 49  BILITOT 1.1 0.9 0.7  PROT 6.4* 5.2* 5.5*  ALBUMIN 3.1* 2.7* 2.9*   No results for input(s): LIPASE, AMYLASE in the last 8760 hours. No results for input(s): AMMONIA in the last 8760 hours. CBC: Recent Labs    11/23/19 0542 11/24/19 0235 11/25/19 0104  WBC 5.3 3.9* 4.2  NEUTROABS 2.5 1.9 2.2  HGB 15.1 13.3 13.7  HCT 45.5 40.0 40.7  MCV 94.8 94.8 95.3  PLT 317 266 307   Cardiac Enzymes: No results for input(s): CKTOTAL, CKMB, CKMBINDEX, TROPONINI in the last 8760 hours. BNP: Invalid input(s): POCBNP No results found for: HGBA1C No results found for: TSH No results found for: VITAMINB12 No results found for: FOLATE Lab Results  Component Value Date   FERRITIN 316 11/25/2019    Imaging and Procedures obtained prior to SNF admission: DG Chest Port 1 View  Result Date: 11/20/2019 CLINICAL DATA:  Shortness of breath. EXAM: PORTABLE CHEST 1 VIEW COMPARISON:  August 20, 2005. FINDINGS: The heart size and mediastinal contours are within normal limits. No pneumothorax or pleural effusion is noted. Faint bibasilar opacities are noted concerning for subsegmental atelectasis or possibly infiltrate. The visualized skeletal structures are unremarkable. IMPRESSION: Faint bibasilar opacities are noted concerning for subsegmental atelectasis or possibly infiltrate. Electronically Signed   By: Jeneen Rinks  Murlean Caller M.D.   On: 11/20/2019 13:17    Assessment/Plan  Pneumonia due to COVID-19 virus Treated with remdesivir, Decadron, Actemra and convalescent plasma Doing very well off his oxygen His only problem at this time is weakness Wants to make sure that he gets his second shot of Covid  Acute respiratory failure due to COVID-19 [facility]) Treated as above Now Off Oxygen. Mild Dyspnea on  Exertion  Poor appetite Trying to supplement BMP pending Was started on Remeron in the hospital Anxiety Continue Librium per patient he has been on this for 30 years  Benign prostatic hyperplasia without lower urinary tract symptoms  Having no symptoms on Vesicare  Flomax was stopped in the hospital ACP Discussed in detail about his discharge to apartment He and his wife would go back next week. Will stay in Covid Unit for now They are planning to hire Help for next few weeks Family/ staff Communication:   Labs/tests ordered:  Total time spent in this patient care encounter was 45 _  minutes; greater than 50% of the visit spent counseling patient and staff, reviewing records , Labs and coordinating care for problems addressed at this encounter.

## 2019-11-30 LAB — BASIC METABOLIC PANEL
BUN: 17 (ref 4–21)
CO2: 32 — AB (ref 13–22)
Chloride: 106 (ref 99–108)
Creatinine: 1 (ref 0.6–1.3)
Glucose: 99
Potassium: 3.9 (ref 3.4–5.3)
Sodium: 142 (ref 137–147)

## 2019-11-30 LAB — COMPREHENSIVE METABOLIC PANEL: Calcium: 8.3 — AB (ref 8.7–10.7)

## 2019-11-30 LAB — CBC AND DIFFERENTIAL
HCT: 36 — AB (ref 41–53)
Hemoglobin: 12 — AB (ref 13.5–17.5)
Platelets: 301 (ref 150–399)
WBC: 3.7

## 2019-11-30 LAB — HEPATIC FUNCTION PANEL
ALT: 56 — AB (ref 10–40)
AST: 29 (ref 14–40)
Alkaline Phosphatase: 39 (ref 25–125)
Bilirubin, Total: 0.4

## 2019-11-30 LAB — CBC: RBC: 3.84 — AB (ref 3.87–5.11)

## 2019-12-05 LAB — CBC AND DIFFERENTIAL
HCT: 35 — AB (ref 41–53)
Hemoglobin: 11.9 — AB (ref 13.5–17.5)
Neutrophils Absolute: 1261
Platelets: 159 (ref 150–399)
WBC: 3.2

## 2019-12-05 LAB — COMPREHENSIVE METABOLIC PANEL: Calcium: 8 — AB (ref 8.7–10.7)

## 2019-12-05 LAB — BASIC METABOLIC PANEL
BUN: 15 (ref 4–21)
CO2: 30 — AB (ref 13–22)
Chloride: 107 (ref 99–108)
Creatinine: 0.9 (ref 0.6–1.3)
Glucose: 101
Potassium: 4.1 (ref 3.4–5.3)
Sodium: 141 (ref 137–147)

## 2019-12-05 LAB — CBC: RBC: 3.81 — AB (ref 3.87–5.11)

## 2019-12-06 ENCOUNTER — Non-Acute Institutional Stay (SKILLED_NURSING_FACILITY): Payer: Medicare Other | Admitting: Internal Medicine

## 2019-12-06 ENCOUNTER — Encounter: Payer: Self-pay | Admitting: Internal Medicine

## 2019-12-06 DIAGNOSIS — U071 COVID-19: Secondary | ICD-10-CM | POA: Diagnosis not present

## 2019-12-06 DIAGNOSIS — F419 Anxiety disorder, unspecified: Secondary | ICD-10-CM

## 2019-12-06 DIAGNOSIS — J1282 Pneumonia due to coronavirus disease 2019: Secondary | ICD-10-CM

## 2019-12-06 DIAGNOSIS — R630 Anorexia: Secondary | ICD-10-CM

## 2019-12-06 DIAGNOSIS — N400 Enlarged prostate without lower urinary tract symptoms: Secondary | ICD-10-CM | POA: Diagnosis not present

## 2019-12-06 DIAGNOSIS — J9600 Acute respiratory failure, unspecified whether with hypoxia or hypercapnia: Secondary | ICD-10-CM

## 2019-12-06 NOTE — Progress Notes (Signed)
Location:   [facility] Room Number: [id number] Place of Service:  SNF [phone])  Provider: Veleta Miners MD  PCP: Jani Gravel, MD Patient Care Team: Jani Gravel, MD as PCP - General (Internal Medicine)  Extended Emergency Contact Information Primary Emergency Contact: Rosezetta Schlatter of [place] Phone: [phone] Relation: Son Secondary Emergency Contact: Cranford,Ann Address: [address] [place]          [place], [place] [address] Johnnette Litter of [place] Phone: [phone] Relation: Spouse  Code Status: Full Code Goals of care:  Advanced Directive information Advanced Directives 11/29/2019  Does Patient Have a Medical Advance Directive? No  Type of Advance Directive -  Does patient want to make changes to medical advance directive? -  Copy of [place] in Chart? -  Would patient like information on creating a medical advance directive? -     Allergies  Allergen Reactions  . Adhesive [Tape] Other (See Comments)    Tears skin off PAPER TAPE IS OKAY    Chief Complaint  Patient presents with  . Discharge Note    D/C from SNF    HPI:  86 y.o. male  Seen today for Discharge today   Patient was admitted in the hospital from 01/17-01/22 for acute respiratory failure due to Covid Patient has history of bladder cancer, prostate cancer and anxiety  He and his wife both got diagnosed with Covid on 01/14 .  They went to the hospital.  There he was admitted for shortness of breath fever chills loss of appetite and weakness. At one time he was needed 10 L of oxygen.  He was treated with Decadron, remdesivir, Actemra and convalescent plasma. His Xray showed possible infiltrate He was then discharged to SNF. He did well here. Had lost almost 15 lbs but now gaining it back. Continues to be weak and Fatigue. Also SOB in exertion. But No Coughing.No fever. Independent in his ADLS. No Dizziness.Wants to go home today    Past Medical History:  Diagnosis Date  . Anxiety   . Asthma    as child  . Bladder cancer [facility]) urologist-  dr Elliot Gault [facility] in [place], [place])   dx 01/ 2016--- post TURBT and post Bladder bx 02-10-2017  . Bladder tumor   . BPH (benign prostatic hyperplasia)   . Frequency of urination   . History of asthma    childhood  . History of external beam radiation therapy    2002  prostate/ pelvis and boost w/ radioactive prostate seed implants   . History of hypercalcemia    s/p  parathyroidectomy 2003  . History of prostate cancer current urologist-  dr Elliot Gault [facility] in [place], [place])-- per lov note (care everywhere) last PSA undetectable   dx 2002--  urologist-- dr dahlstedt/ oncologist dr Valere Dross---  Gleason 7 out of 10, PSA 4.9---post Radioactive Prostate Seed Implant and External Beam Radiation therapy  . History of skin cancer   . Hyperlipidemia   . Multiple lung nodules    since 2006--- followed by pcp --- dr Maudie Mercury  . Renal cyst, left   . Wears glasses   . Wears hearing aid in both ears     Past Surgical History:  Procedure Laterality Date  . CARDIOVASCULAR STRESS TEST  01/01/2004   normal nuclear perfustion study w/ no ischemia/  normal LV function and wall motion, ef 65%  . CATARACT EXTRACTION W/ INTRAOCULAR LENS  IMPLANT, BILATERAL  2010  . COLONOSCOPY    . CYSTO/  BILATERAL RETROGRADE PYELOGRAM/  BLADDER BX'S AND FULGERATION  02-10-2017   dr Anabel Bene [facility] in [place], [place]  . CYSTOSCOPY W/ RETROGRADES Bilateral 07/27/2017   Procedure: CYSTOSCOPY,SELECTIVE CYTOLOGIES,RETROGRADE PYELOGRAM;  Surgeon: Cleon Gustin, MD;  [facility];  Service: Urology;  Laterality: Bilateral;  . CYSTOSCOPY W/ RETROGRADES Bilateral 12/08/2017   Procedure: CYSTOSCOPY WITH RETROGRADE PYELOGRAM;  Surgeon: Cleon Gustin, MD;  Location: [facility];  Service: Urology;  Laterality: Bilateral;  . CYSTOSCOPY WITH  BIOPSY N/A 07/27/2017   Procedure: CYSTOSCOPY WITH BIOPSY OF BLADDER AND PROSTATIC URETHRA;  Surgeon: Cleon Gustin, MD;  Location: [facility];  Service: Urology;  Laterality: N/A;  . CYSTOSCOPY WITH BIOPSY Bilateral 12/08/2017   Procedure: CYSTOSCOPY WITH RENAL WASHINGS;  Surgeon: Cleon Gustin, MD;  Location[facility];  Service: Urology;  Laterality: Bilateral;  . FIBEROPTIC BRONCHOSCOPY  08-20-2005   dr wert   w/ Left lower lobe bx  . PARATHYROIDECTOMY  2003  . RADIOACTIVE PROSTATE SEED IMPLANTS  04-05-2001    dr Diona Fanti [facility]  . TONSILLECTOMY  child  . TRANSURETHRAL RESECTION OF BLADDER TUMOR  11-24-2014   dr Anabel Bene [facility] in [place], [place]  . TRANSURETHRAL RESECTION OF BLADDER TUMOR N/A 04/08/2018   Procedure: TRANSURETHRAL RESECTION OF BLADDER TUMOR (TURBT);  Surgeon: Cleon Gustin, MD;  Location: [facility];  Service: Urology;  Laterality: N/A;  . TRANSURETHRAL RESECTION OF PROSTATE N/A 02/28/2019   Procedure: TRANSURETHRAL RESECTION OF THE PROSTATE (TURP);  Surgeon: Cleon Gustin, MD;  Location: [facility];  Service: Urology;  Laterality: N/A;  30 MINS  . UPPER GI ENDOSCOPY        reports that he quit smoking about 35 years ago. His smoking use included cigarettes. He quit after 50.00 years of use. He has never used smokeless tobacco. He reports current alcohol use of about 7.0 standard drinks of alcohol per week. He reports that he does not use drugs. Social History   Socioeconomic History  . Marital status: Married    Spouse name: Not on file  . Number of children: Not on file  . Years of education: Not on file  . Highest education level: Not on file  Occupational History  . Not on file  Tobacco Use  . Smoking status: Former Smoker    Years: 50.00    Types: Cigarettes    Quit date: 11/03/1984    Years since quitting: 35.1  . Smokeless tobacco: Never Used  Substance and Sexual  Activity  . Alcohol use: Yes    Alcohol/week: 7.0 standard drinks    Types: 7 Glasses of wine per week    Comment: daily wine  . Drug use: No  . Sexual activity: Not Currently  Other Topics Concern  . Not on file  Social History Narrative  . Not on file   Social Determinants of Health   Financial Resource Strain:   . Difficulty of Paying Living Expenses: Not on file  Food Insecurity:   . Worried About Charity fundraiser in the Last Year: Not on file  . Ran Out of Food in the Last Year: Not on file  Transportation Needs:   . Lack of Transportation (Medical): Not on file  . Lack of Transportation (Non-Medical): Not on file  Physical Activity:   . Days of Exercise per Week: Not on file  . Minutes  of Exercise per Session: Not on file  Stress:   . Feeling of Stress : Not on file  Social Connections:   . Frequency of Communication with Friends and Family: Not on file  . Frequency of Social Gatherings with Friends and Family: Not on file  . Attends Religious Services: Not on file  . Active Member of Clubs or Organizations: Not on file  . Attends Archivist Meetings: Not on file  . Marital Status: Not on file  Intimate Partner Violence:   . Fear of Current or Ex-Partner: Not on file  . Emotionally Abused: Not on file  . Physically Abused: Not on file  . Sexually Abused: Not on file   Functional Status Survey:    Allergies  Allergen Reactions  . Adhesive [Tape] Other (See Comments)    Tears skin off PAPER TAPE IS OKAY    Pertinent  Health Maintenance Due  Topic Date Due  . PNA vac Low Risk Adult (1 of 2 - PCV13) 04/22/1999  . INFLUENZA VACCINE  06/04/2019    Medications: Allergies as of 12/06/2019      Reactions   Adhesive [tape] Other (See Comments)   Tears skin off PAPER TAPE IS OKAY      Medication List       Accurate as of December 06, 2019 11:18 AM. If you have any questions, ask your nurse or doctor.        acetaminophen 325 MG tablet  Commonly known as: TYLENOL Take 325 mg by mouth every 6 (six) hours as needed (for pain).   benzonatate 100 MG capsule Commonly known as: Tessalon Perles Take 1 capsule (100 mg total) by mouth 3 (three) times daily as needed for cough.   chlordiazePOXIDE 10 MG capsule Commonly known as: LIBRIUM Take 1 capsule (10 mg total) by mouth daily. In the morning   folic acid A999333 MCG tablet Commonly known as: FOLVITE Take 400 mcg by mouth daily.   mirtazapine 15 MG tablet Commonly known as: REMERON Take 7.5 mg by mouth at bedtime.   NON FORMULARY apply Vaseline to buttocks after each incontinent episode Every Shift   OXYGEN Inhale 2 L into the lungs continuous. OXYGEN AT 2L/[place] TO KEEP SATS EQUAL TO OR GREATER THAN 88% Every Shift   pravastatin 40 MG tablet Commonly known as: PRAVACHOL Take 40 mg by mouth daily. In the morning.   solifenacin 10 MG tablet Commonly known as: VESICARE Take 10 mg by mouth daily. In the morning.   tamsulosin 0.4 MG Caps capsule Commonly known as: FLOMAX Take 0.4 mg by mouth daily.   thiamine 100 MG tablet Take 100 mg by mouth daily.   Tubersol 5 UNIT/0.1ML injection Generic drug: tuberculin Inject into the skin once.   vitamin B-12 1000 MCG tablet Commonly known as: CYANOCOBALAMIN Take 1,000 mcg by mouth daily.   Vitamin D3 25 MCG (1000 UT) Caps Take 1,000 Units by mouth daily.   zinc sulfate 220 (50 Zn) MG capsule Take 220 mg by mouth daily.       Review of Systems  Vitals:   12/06/19 1112  BP: (!) 118/52  Pulse: 100  Resp: 20  Temp: 98.7 F (37.1 C)  SpO2: 90%  Weight: 121 lb 12.8 oz (55.2 kg)  Height: 6' (1.829 m)   Body mass index is 16.52 kg/m. Physical Exam Vitals reviewed.  Constitutional:      Appearance: Normal appearance.  HENT:     Head: Normocephalic.  Nose: Nose normal.     Mouth/Throat:     Mouth: Mucous membranes are moist.     Pharynx: Oropharynx is clear.  Eyes:     Pupils: Pupils are equal,  round, and reactive to light.  Cardiovascular:     Rate and Rhythm: Normal rate.     Heart sounds: No murmur.  Pulmonary:     Effort: Pulmonary effort is normal. No respiratory distress.     Breath sounds: Normal breath sounds. No rales.  Abdominal:     General: Abdomen is flat. Bowel sounds are normal.     Palpations: Abdomen is soft.  Musculoskeletal:        General: No swelling.     Cervical back: Neck supple.  Skin:    General: Skin is warm.  Neurological:     General: No focal deficit present.     Mental Status: He is alert and oriented to person, place, and time.     Comments: Was Walking with no assist  Psychiatric:        Mood and Affect: Mood normal.     Labs reviewed: Basic Metabolic Panel: Recent Labs    11/23/19 0542 11/23/19 0542 11/24/19 0235 11/24/19 0235 11/25/19 0104 11/30/19 0000 12/05/19 0000  NA 142   < > 141   < > 144 142 141  K 3.2*   < > 3.7   < > 3.6 3.9 4.1  CL 104   < > 106   < > 102 106 107  CO2 28   < > 26   < > 31 32* 30*  GLUCOSE 105*  --  104*  --  110*  --   --   BUN 50*   < > 42*   < > 39* 17 15  CREATININE 0.99   < > 0.81   < > 0.86 1.0 0.9  CALCIUM 8.6*   < > 8.3*   < > 9.3 8.3* 8.0*  MG  --   --  2.4  --   --   --   --    < > = values in this interval not displayed.   Liver Function Tests: Recent Labs    11/23/19 0542 11/23/19 0542 11/24/19 0235 11/25/19 0104 11/30/19 0000  AST 67*   < > 39 37 29  ALT 89*   < > 66* 64* 56*  ALKPHOS 52   < > 45 49 39  BILITOT 1.1  --  0.9 0.7  --   PROT 6.4*  --  5.2* 5.5*  --   ALBUMIN 3.1*  --  2.7* 2.9*  --    < > = values in this interval not displayed.   No results for input(s): LIPASE, AMYLASE in the last 8760 hours. No results for input(s): AMMONIA in the last 8760 hours. CBC: Recent Labs    11/23/19 0542 11/23/19 0542 11/24/19 0235 11/24/19 0235 11/25/19 0104 11/30/19 0000 12/05/19 0000  WBC 5.3   < > 3.9*   < > 4.2 3.7 3.2  NEUTROABS 2.5   < > 1.9  --  2.2  --   1,261  HGB 15.1   < > 13.3   < > 13.7 12.0* 11.9*  HCT 45.5   < > 40.0   < > 40.7 36* 35*  MCV 94.8  --  94.8  --  95.3  --   --   PLT 317   < > 266   < > 307 301 159   < > =  values in this interval not displayed.   Cardiac Enzymes: No results for input(s): CKTOTAL, CKMB, CKMBINDEX, TROPONINI in the last 8760 hours. BNP: Invalid input(s): POCBNP CBG: Recent Labs    11/21/19 1219  GLUCAP 132*    Procedures and Imaging Studies During Stay: [facility] Chest Port 1 View  Result Date: 11/20/2019 CLINICAL DATA:  Shortness of breath. EXAM: PORTABLE CHEST 1 VIEW COMPARISON:  August 20, 2005. FINDINGS: The heart size and mediastinal contours are within normal limits. No pneumothorax or pleural effusion is noted. Faint bibasilar opacities are noted concerning for subsegmental atelectasis or possibly infiltrate. The visualized skeletal structures are unremarkable. IMPRESSION: Faint bibasilar opacities are noted concerning for subsegmental atelectasis or possibly infiltrate. Electronically Signed   By: Marijo Conception M.D.   On: 11/20/2019 13:17    Assessment/Plan:    Pneumonia due to COVID-19 virus Treated with remdesivir, Decadron, Actemra and convalescent plasma Got second Shot of Covid Vaccine here. Continues to have weakness and SOB on Exertion but does not qualify for Oxygen Acute respiratory failure due to COVID-19 [facility]) Some dyspnea on Exertion  Poor appetite Doing well On Remeron 17/.99 BUN and Creat Anxiety Continue Librium per patient he has been on this for 30 years  Benign prostatic hyperplasia without lower urinary tract symptoms Doing well on Flomax and Vesicare  Discharge Planning Discussed in detail about his discharge to apartment Going back with his Wife to [place]. His Sons will help then till they hire more help. Therapy to eval and treat   Patient has been advised to f/u with their PCP in 1-2 weeks to bring them up to date on their rehab stay.  Social services  at facility was responsible for arranging this appointment.  Pt was provided with a 30 day supply of prescriptions for medications and refills must be obtained from their PCP.  For controlled substances, a more limited supply may be provided adequate until PCP appointment only.  Future labs/tests needed:

## 2019-12-15 ENCOUNTER — Encounter: Payer: Self-pay | Admitting: Internal Medicine

## 2019-12-15 NOTE — Progress Notes (Signed)
A user error has taken place.

## 2019-12-21 ENCOUNTER — Non-Acute Institutional Stay: Payer: Medicare Other | Admitting: Internal Medicine

## 2019-12-21 ENCOUNTER — Encounter: Payer: Self-pay | Admitting: Internal Medicine

## 2019-12-21 ENCOUNTER — Other Ambulatory Visit: Payer: Self-pay

## 2019-12-21 VITALS — BP 120/80 | HR 90 | Temp 97.8°F | Ht 72.0 in | Wt 124.4 lb

## 2019-12-21 DIAGNOSIS — F419 Anxiety disorder, unspecified: Secondary | ICD-10-CM | POA: Diagnosis not present

## 2019-12-21 DIAGNOSIS — E785 Hyperlipidemia, unspecified: Secondary | ICD-10-CM

## 2019-12-21 DIAGNOSIS — N400 Enlarged prostate without lower urinary tract symptoms: Secondary | ICD-10-CM | POA: Diagnosis not present

## 2019-12-21 DIAGNOSIS — U071 COVID-19: Secondary | ICD-10-CM

## 2019-12-21 DIAGNOSIS — R630 Anorexia: Secondary | ICD-10-CM

## 2019-12-21 DIAGNOSIS — E538 Deficiency of other specified B group vitamins: Secondary | ICD-10-CM

## 2019-12-21 DIAGNOSIS — J1282 Pneumonia due to coronavirus disease 2019: Secondary | ICD-10-CM

## 2019-12-21 NOTE — Progress Notes (Signed)
Location: [place] of Service:  Clinic (12)  Provider:   Code Status: Goals of Care:  Advanced Directives 11/29/2019  Does Patient Have a Medical Advance Directive? No  Type of Advance Directive -  Does patient want to make changes to medical advance directive? -  Copy of [place] in Chart? -  Would patient like information on creating a medical advance directive? -     Chief Complaint  Patient presents with  . Medical Management of Chronic Issues    Patient would like to discuss his breathing and medications  . Health Maintenance    PCV13, TDAP    HPI: Patient is a 86 y.o. male seen today for an acute visit for to establish care with our office After his discharge from the Acute unit in [place]  Patient was admitted in the hospital from01/17-01/37for acute respiratory failure due to Covid Patient has history of bladder cancer,prostate cancer and anxiety  He and his wifebothgot diagnosed with Covid on 01/14 .They went to the hospital.There he was admitted for shortness of breath fever chills loss of appetite and weakness. At one time he was needed 10 L of oxygen. He was treated with Decadron, remdesivir, Actemra andconvalescent plasma.His Xrayshowed possible infiltrate He was then discharged to SNF. He did well there. Had lost almost 15 lbs.  He is now back with his wife in his apartment. Doing well. walking with no assist. Does get SOB but not using Oxygen says that Sats Staying above 90% Still working with therapy. Appetite is coming back. No Falls. Some weakness. No Coughing or Fever    Past Medical History:  Diagnosis Date  . Anxiety   . Asthma    as child  . Bladder cancer [facility]) urologist-  dr Elliot Gault [facility] in [place], [place])   dx 01/ 2016--- post TURBT and post Bladder bx 02-10-2017  . Bladder tumor   . BPH (benign prostatic hyperplasia)   . Frequency of urination   . History of  asthma    childhood  . History of external beam radiation therapy    2002  prostate/ pelvis and boost w/ radioactive prostate seed implants   . History of hypercalcemia    s/p  parathyroidectomy 2003  . History of prostate cancer current urologist-  dr Elliot Gault [facility] in [place], [place])-- per lov note (care everywhere) last PSA undetectable   dx 2002--  urologist-- dr dahlstedt/ oncologist dr Valere Dross---  Gleason 7 out of 10, PSA 4.9---post Radioactive Prostate Seed Implant and External Beam Radiation therapy  . History of skin cancer   . Hyperlipidemia   . Multiple lung nodules    since 2006--- followed by pcp --- dr Maudie Mercury  . Renal cyst, left   . Wears glasses   . Wears hearing aid in both ears     Past Surgical History:  Procedure Laterality Date  . CARDIOVASCULAR STRESS TEST  01/01/2004   normal nuclear perfustion study w/ no ischemia/  normal LV function and wall motion, ef 65%  . CATARACT EXTRACTION W/ INTRAOCULAR LENS  IMPLANT, BILATERAL  2010  . COLONOSCOPY    . CYSTO/  BILATERAL RETROGRADE PYELOGRAM/  BLADDER BX'S AND FULGERATION  02-10-2017   dr Anabel Bene [facility] in [place], [place]  . CYSTOSCOPY W/ RETROGRADES Bilateral 07/27/2017   Procedure: CYSTOSCOPY,SELECTIVE CYTOLOGIES,RETROGRADE PYELOGRAM;  Surgeon: Cleon Gustin, MD;  Location: [facility];  Service: Urology;  Laterality: Bilateral;  . CYSTOSCOPY  W/ RETROGRADES Bilateral 12/08/2017   Procedure: CYSTOSCOPY WITH RETROGRADE PYELOGRAM;  Surgeon: Cleon Gustin, MD;  Location: [facility];  Service: Urology;  Laterality: Bilateral;  . CYSTOSCOPY WITH BIOPSY N/A 07/27/2017   Procedure: CYSTOSCOPY WITH BIOPSY OF BLADDER AND PROSTATIC URETHRA;  Surgeon: Cleon Gustin, MD;  Location: [facility];  Service: Urology;  Laterality: N/A;  . CYSTOSCOPY WITH BIOPSY Bilateral 12/08/2017   Procedure: CYSTOSCOPY WITH RENAL WASHINGS;  Surgeon: Cleon Gustin, MD;   Location: [facility];  Service: Urology;  Laterality: Bilateral;  . FIBEROPTIC BRONCHOSCOPY  08-20-2005   dr wert   w/ Left lower lobe bx  . PARATHYROIDECTOMY  2003  . RADIOACTIVE PROSTATE SEED IMPLANTS  04-05-2001    dr Diona Fanti [facility]  . TONSILLECTOMY  child  . TRANSURETHRAL RESECTION OF BLADDER TUMOR  11-24-2014   dr Anabel Bene [facility] in [place], [place]  . TRANSURETHRAL RESECTION OF BLADDER TUMOR N/A 04/08/2018   Procedure: TRANSURETHRAL RESECTION OF BLADDER TUMOR (TURBT);  Surgeon: Cleon Gustin, MD;  Location: [facility];  Service: Urology;  Laterality: N/A;  . TRANSURETHRAL RESECTION OF PROSTATE N/A 02/28/2019   Procedure: TRANSURETHRAL RESECTION OF THE PROSTATE (TURP);  Surgeon: Cleon Gustin, MD;  Location: [facility];  Service: Urology;  Laterality: N/A;  30 MINS  . UPPER GI ENDOSCOPY      Allergies  Allergen Reactions  . Adhesive [Tape] Other (See Comments)    Tears skin off PAPER TAPE IS OKAY    Outpatient Encounter Medications as of 12/21/2019  Medication Sig  . acetaminophen (TYLENOL) 325 MG tablet Take 325 mg by mouth every 6 (six) hours as needed (for pain).  . chlordiazePOXIDE (LIBRIUM) 10 MG capsule Take 1 capsule (10 mg total) by mouth daily. In the morning  . Cholecalciferol (VITAMIN D3) 1000 units CAPS Take 1,000 Units by mouth daily.  . pravastatin (PRAVACHOL) 40 MG tablet Take 40 mg by mouth daily. In the morning.  . solifenacin (VESICARE) 10 MG tablet Take 10 mg by mouth daily. In the morning.  . tamsulosin (FLOMAX) 0.4 MG CAPS capsule Take 0.4 mg by mouth daily.  . vitamin B-12 (CYANOCOBALAMIN) 1000 MCG tablet Take 1,000 mcg by mouth daily.  . [DISCONTINUED] thiamine 100 MG tablet Take 100 mg by mouth daily.  . benzonatate (TESSALON PERLES) 100 MG capsule Take 1 capsule (100 mg total) by mouth 3 (three) times daily as needed for cough. (Patient not taking: Reported on 12/21/2019)  . mirtazapine  (REMERON) 15 MG tablet Take 7.5 mg by mouth at bedtime.  . NON FORMULARY apply Vaseline to buttocks after each incontinent episode Every Shift  . OXYGEN Inhale 2 L into the lungs continuous. OXYGEN AT 2L/ TO KEEP SATS EQUAL TO OR GREATER THAN 88% Every Shift  . tuberculin (TUBERSOL) 5 UNIT/0.1ML injection Inject into the skin once.  . [DISCONTINUED] folic acid (FOLVITE) A999333 MCG tablet Take 400 mcg by mouth daily.  . [DISCONTINUED] zinc sulfate 220 (50 Zn) MG capsule Take 220 mg by mouth daily.   No facility-administered encounter medications on file as of 12/21/2019.    Review of Systems:  Review of Systems  Constitutional: Positive for activity change and appetite change.  HENT: Negative.   Respiratory: Positive for shortness of breath.   Cardiovascular: Negative.   Gastrointestinal: Negative.   Genitourinary: Negative.   Musculoskeletal: Negative.   Neurological: Positive for weakness.  Psychiatric/Behavioral: Negative.     Health Maintenance  Topic Date Due  . TETANUS/TDAP  04/21/1951  . PNA vac Low Risk Adult (1 of 2 - PCV13) 04/22/1999  . INFLUENZA VACCINE  Completed    Physical Exam: Vitals:   12/21/19 1526  BP: 120/80  Pulse: 90  Temp: 97.8 F (36.6 C)  SpO2: 97%  Weight: 124 lb 6.4 oz (56.4 kg)  Height: 6' (1.829 m)   Body mass index is 16.87 kg/m. Physical Exam  Constitutional: Oriented to person, place, and time. Well-developed  HENT:  Head: Normocephalic.  Mouth/Throat: Oropharynx is clear and moist.  Eyes: Pupils are equal, round, and reactive to light.  Neck: Neck supple.  Cardiovascular: Normal rate and normal heart sounds.  No murmur heard. Pulmonary/Chest: Effort normal and breath sounds normal. No respiratory distress. No wheezes. He has no rales.  Abdominal: Soft. Bowel sounds are normal. No distension. There is no tenderness. There is no rebound.  Musculoskeletal: No edema.  Lymphadenopathy: none Neurological: Alert and oriented to person,  place, and time.  Skin: Skin is warm and dry.  Psychiatric: Normal mood and affect. Behavior is normal. Thought content normal.   Labs reviewed: Basic Metabolic Panel: Recent Labs    11/23/19 0542 11/23/19 0542 11/24/19 0235 11/24/19 0235 11/25/19 0104 11/30/19 0000 12/05/19 0000  NA 142   < > 141   < > 144 142 141  K 3.2*   < > 3.7   < > 3.6 3.9 4.1  CL 104   < > 106   < > 102 106 107  CO2 28   < > 26   < > 31 32* 30*  GLUCOSE 105*  --  104*  --  110*  --   --   BUN 50*   < > 42*   < > 39* 17 15  CREATININE 0.99   < > 0.81   < > 0.86 1.0 0.9  CALCIUM 8.6*   < > 8.3*   < > 9.3 8.3* 8.0*  MG  --   --  2.4  --   --   --   --    < > = values in this interval not displayed.   Liver Function Tests: Recent Labs    11/23/19 0542 11/23/19 0542 11/24/19 0235 11/25/19 0104 11/30/19 0000  AST 67*   < > 39 37 29  ALT 89*   < > 66* 64* 56*  ALKPHOS 52   < > 45 49 39  BILITOT 1.1  --  0.9 0.7  --   PROT 6.4*  --  5.2* 5.5*  --   ALBUMIN 3.1*  --  2.7* 2.9*  --    < > = values in this interval not displayed.   No results for input(s): LIPASE, AMYLASE in the last 8760 hours. No results for input(s): AMMONIA in the last 8760 hours. CBC: Recent Labs    11/23/19 0542 11/23/19 0542 11/24/19 0235 11/24/19 0235 11/25/19 0104 11/30/19 0000 12/05/19 0000  WBC 5.3   < > 3.9*   < > 4.2 3.7 3.2  NEUTROABS 2.5   < > 1.9  --  2.2  --  1,261  HGB 15.1   < > 13.3   < > 13.7 12.0* 11.9*  HCT 45.5   < > 40.0   < > 40.7 36* 35*  MCV 94.8  --  94.8  --  95.3  --   --   PLT 317   < > 266   < > 307 301  159   < > = values in this interval not displayed.   Lipid Panel: Recent Labs    11/20/19 1203  TRIG 91   No results found for: HGBA1C  Procedures since last visit: No results found.  Assessment/Plan Pneumonia due to COVID-19 virus Treated with remdesivir, Decadron, Actemra and convalescent plasma Got both Shots for Covid Vaccine Refused for Oxygen  Says he doesn't need it Is  doing well with his walking. Still working with therapy. Discontinued Folate, Thiamine and Zinc  Anxiety Stable on Librium  Benign prostatic hyperplasia  Doing well on Vesicare and Flomax Sees Urology Q 3 Months  Poor appetite Was started on Remeron in the Unit. He never took it Will discontinue Hyperlipidemia Continue on Statin B 12 def Continue Supplement  Labs/tests ordered:  * No order type specified * Next appt:  01/18/2020

## 2020-01-12 ENCOUNTER — Other Ambulatory Visit: Payer: Self-pay

## 2020-01-12 DIAGNOSIS — E538 Deficiency of other specified B group vitamins: Secondary | ICD-10-CM

## 2020-01-12 DIAGNOSIS — E785 Hyperlipidemia, unspecified: Secondary | ICD-10-CM

## 2020-01-12 DIAGNOSIS — R630 Anorexia: Secondary | ICD-10-CM

## 2020-01-12 DIAGNOSIS — J1282 Pneumonia due to coronavirus disease 2019: Secondary | ICD-10-CM

## 2020-01-12 DIAGNOSIS — N400 Enlarged prostate without lower urinary tract symptoms: Secondary | ICD-10-CM

## 2020-01-12 LAB — LIPID PANEL
Cholesterol: 150 mg/dL (ref <200)
HDL: 62 mg/dL (ref >=40)
LDL Cholesterol (Calc): 73 mg/dL (calc)
Non-HDL Cholesterol (Calc): 88 mg/dL (calc) (ref <130)
Total CHOL/HDL Ratio: 2.4 (calc) (ref <5.0)
Triglycerides: 66 mg/dL (ref <150)

## 2020-01-12 LAB — BASIC METABOLIC PANEL
BUN: 20 mg/dL (ref 7–25)
CO2: 25 mmol/L (ref 20–32)
Calcium: 8.8 mg/dL (ref 8.6–10.3)
Chloride: 106 mmol/L (ref 98–110)
Creat: 1 mg/dL (ref 0.70–1.11)
Glucose, Bld: 94 mg/dL (ref 65–99)
Potassium: 4.3 mmol/L (ref 3.5–5.3)
Sodium: 138 mmol/L (ref 135–146)

## 2020-01-12 LAB — HEPATIC FUNCTION PANEL
AG Ratio: 1.7 (calc) (ref 1.0–2.5)
ALT: 9 U/L (ref 9–46)
AST: 12 U/L (ref 10–35)
Albumin: 3.5 g/dL — ABNORMAL LOW (ref 3.6–5.1)
Alkaline phosphatase (APISO): 67 U/L (ref 35–144)
Bilirubin, Direct: 0.1 mg/dL (ref 0.0–0.2)
Globulin: 2.1 g/dL (calc) (ref 1.9–3.7)
Indirect Bilirubin: 0.3 mg/dL (calc) (ref 0.2–1.2)
Total Bilirubin: 0.4 mg/dL (ref 0.2–1.2)
Total Protein: 5.6 g/dL — ABNORMAL LOW (ref 6.1–8.1)

## 2020-01-12 LAB — CBC WITH DIFFERENTIAL/PLATELET
Absolute Monocytes: 778 cells/uL (ref 200–950)
Basophils Absolute: 23 cells/uL (ref 0–200)
Basophils Relative: 0.3 %
Eosinophils Absolute: 39 cells/uL (ref 15–500)
Eosinophils Relative: 0.5 %
HCT: 38.2 % — ABNORMAL LOW (ref 38.5–50.0)
Hemoglobin: 12.9 g/dL — ABNORMAL LOW (ref 13.2–17.1)
Lymphs Abs: 3172 cells/uL (ref 850–3900)
MCH: 31.2 pg (ref 27.0–33.0)
MCHC: 33.8 g/dL (ref 32.0–36.0)
MCV: 92.3 fL (ref 80.0–100.0)
MPV: 9.2 fL (ref 7.5–12.5)
Monocytes Relative: 10.1 %
Neutro Abs: 3688 cells/uL (ref 1500–7800)
Neutrophils Relative %: 47.9 %
Platelets: 245 10*3/uL (ref 140–400)
RBC: 4.14 10*6/uL — ABNORMAL LOW (ref 4.20–5.80)
RDW: 13.7 % (ref 11.0–15.0)
Total Lymphocyte: 41.2 %
WBC: 7.7 10*3/uL (ref 3.8–10.8)

## 2020-01-12 LAB — VITAMIN B12: Vitamin B-12: 881 pg/mL (ref 200–1100)

## 2020-01-12 LAB — TSH: TSH: 1.46 mIU/L (ref 0.40–4.50)

## 2020-01-18 ENCOUNTER — Encounter: Payer: Self-pay | Admitting: Internal Medicine

## 2020-01-18 ENCOUNTER — Non-Acute Institutional Stay: Payer: Medicare Other | Admitting: Internal Medicine

## 2020-01-18 ENCOUNTER — Other Ambulatory Visit: Payer: Self-pay

## 2020-01-18 VITALS — BP 108/68 | HR 100 | Temp 97.7°F | Ht 72.0 in | Wt 123.8 lb

## 2020-01-18 DIAGNOSIS — E785 Hyperlipidemia, unspecified: Secondary | ICD-10-CM

## 2020-01-18 DIAGNOSIS — E538 Deficiency of other specified B group vitamins: Secondary | ICD-10-CM

## 2020-01-18 DIAGNOSIS — R531 Weakness: Secondary | ICD-10-CM

## 2020-01-18 DIAGNOSIS — N400 Enlarged prostate without lower urinary tract symptoms: Secondary | ICD-10-CM

## 2020-01-18 DIAGNOSIS — R630 Anorexia: Secondary | ICD-10-CM

## 2020-01-18 DIAGNOSIS — F419 Anxiety disorder, unspecified: Secondary | ICD-10-CM

## 2020-01-18 NOTE — Progress Notes (Signed)
Location: [place] of Service:  Clinic (12)  Provider:   Code Status:  Goals of Care:  Advanced Directives 11/29/2019  Does Patient Have a Medical Advance Directive? No  Type of Advance Directive -  Does patient want to make changes to medical advance directive? -  Copy of [place] in Chart? -  Would patient like information on creating a medical advance directive? -     Chief Complaint  Patient presents with  . Medical Management of Chronic Issues    Follow up. Depression screening. Patient continues to have difficulty breathing. He complains of low energy. He has to force himself to eat the foods.   . Health Maintenance    TDAP,    HPI: Patient is a 86 y.o. male seen today for an acute visit for Follow up   Active issues Post Covid Weakness and decreased Appetite Continues to be the issue though Weight is stable but is not gaining his Baseline weight back Feels Overwhelmed with taking care of his Wife Also feels Anxious and Depressed that she is worsening of her Cognition His weight is stable SOB Much better Not getting SOB anymore on Exertion  All other issues are stable Drives. No falls No Assist in walking  Past Medical History:  Diagnosis Date  . Anxiety   . Asthma    as child  . Bladder cancer [facility]) urologist-  dr Elliot Gault [facility] in [place], [place])   dx 01/ 2016--- post TURBT and post Bladder bx 02-10-2017  . Bladder tumor   . BPH (benign prostatic hyperplasia)   . Frequency of urination   . History of asthma    childhood  . History of external beam radiation therapy    2002  prostate/ pelvis and boost w/ radioactive prostate seed implants   . History of hypercalcemia    s/p  parathyroidectomy 2003  . History of prostate cancer current urologist-  dr Elliot Gault [facility] in [place], [place])-- per lov note (care everywhere) last PSA undetectable   dx 2002--  urologist-- dr dahlstedt/  oncologist dr Valere Dross---  Gleason 7 out of 10, PSA 4.9---post Radioactive Prostate Seed Implant and External Beam Radiation therapy  . History of skin cancer   . Hyperlipidemia   . Multiple lung nodules    since 2006--- followed by pcp --- dr Maudie Mercury  . Renal cyst, left   . Wears glasses   . Wears hearing aid in both ears     Past Surgical History:  Procedure Laterality Date  . CARDIOVASCULAR STRESS TEST  01/01/2004   normal nuclear perfustion study w/ no ischemia/  normal LV function and wall motion, ef 65%  . CATARACT EXTRACTION W/ INTRAOCULAR LENS  IMPLANT, BILATERAL  2010  . COLONOSCOPY    . CYSTO/  BILATERAL RETROGRADE PYELOGRAM/  BLADDER BX'S AND FULGERATION  02-10-2017   dr Anabel Bene [facility] in [place], [place]  . CYSTOSCOPY W/ RETROGRADES Bilateral 07/27/2017   Procedure: CYSTOSCOPY,SELECTIVE CYTOLOGIES,RETROGRADE PYELOGRAM;  Surgeon: Cleon Gustin, MD;  Location: [facility];  Service: Urology;  Laterality: Bilateral;  . CYSTOSCOPY W/ RETROGRADES Bilateral 12/08/2017   Procedure: CYSTOSCOPY WITH RETROGRADE PYELOGRAM;  Surgeon: Cleon Gustin, MD;  Location: [facility];  Service: Urology;  Laterality: Bilateral;  . CYSTOSCOPY WITH BIOPSY N/A 07/27/2017   Procedure: CYSTOSCOPY WITH BIOPSY OF BLADDER AND PROSTATIC URETHRA;  Surgeon: Cleon Gustin, MD;  Location: [facility];  Service: Urology;  Laterality: N/A;  . CYSTOSCOPY WITH BIOPSY Bilateral 12/08/2017   Procedure: CYSTOSCOPY WITH RENAL WASHINGS;  Surgeon: Cleon Gustin, MD;  Location: [facility];  Service: Urology;  Laterality: Bilateral;  . FIBEROPTIC BRONCHOSCOPY  08-20-2005   dr wert   w/ Left lower lobe bx  . PARATHYROIDECTOMY  2003  . RADIOACTIVE PROSTATE SEED IMPLANTS  04-05-2001    dr Diona Fanti [facility]  . TONSILLECTOMY  child  . TRANSURETHRAL RESECTION OF BLADDER TUMOR  11-24-2014   dr Anabel Bene [facility] in [place], [place]    . TRANSURETHRAL RESECTION OF BLADDER TUMOR N/A 04/08/2018   Procedure: TRANSURETHRAL RESECTION OF BLADDER TUMOR (TURBT);  Surgeon: Cleon Gustin, MD;  Location: [facility];  Service: Urology;  Laterality: N/A;  . TRANSURETHRAL RESECTION OF PROSTATE N/A 02/28/2019   Procedure: TRANSURETHRAL RESECTION OF THE PROSTATE (TURP);  Surgeon: Cleon Gustin, MD;  [facility];  Service: Urology;  Laterality: N/A;  30 MINS  . UPPER GI ENDOSCOPY      Allergies  Allergen Reactions  . Adhesive [Tape] Other (See Comments)    Tears skin off PAPER TAPE IS OKAY    Outpatient Encounter Medications as of 01/18/2020  Medication Sig  . acetaminophen (TYLENOL) 325 MG tablet Take 325 mg by mouth every 6 (six) hours as needed (for pain).  . chlordiazePOXIDE (LIBRIUM) 10 MG capsule Take 1 capsule (10 mg total) by mouth daily. In the morning  . Cholecalciferol (VITAMIN D3) 1000 units CAPS Take 1,000 Units by mouth daily.  . Multiple Vitamins-Minerals (PRESERVISION AREDS 2 PO) Take 1 tablet by mouth in the morning and at bedtime.  . NON FORMULARY apply Vaseline to buttocks after each incontinent episode Every Shift  . pravastatin (PRAVACHOL) 40 MG tablet Take 40 mg by mouth daily. In the morning.  . solifenacin (VESICARE) 10 MG tablet Take 10 mg by mouth daily. In the morning.  . tamsulosin (FLOMAX) 0.4 MG CAPS capsule Take 0.4 mg by mouth daily.  . vitamin B-12 (CYANOCOBALAMIN) 1000 MCG tablet Take 1,000 mcg by mouth daily.  . [DISCONTINUED] OXYGEN Inhale 2 L into the lungs continuous. OXYGEN AT 2L/[place] TO KEEP SATS EQUAL TO OR GREATER THAN 88% Every Shift  . [DISCONTINUED] tuberculin (TUBERSOL) 5 UNIT/0.1ML injection Inject into the skin once.   No facility-administered encounter medications on file as of 01/18/2020.    Review of Systems:  Review of Systems  Constitutional: Negative.   HENT: Negative.   Respiratory: Negative.   Cardiovascular: Negative.   Gastrointestinal:  Negative.   Genitourinary: Negative.   Musculoskeletal: Negative.   Skin: Negative.   Neurological: Positive for weakness.  Psychiatric/Behavioral: Positive for dysphoric mood.  All other systems reviewed and are negative.   Health Maintenance  Topic Date Due  . TETANUS/TDAP  Never done  . PNA vac Low Risk Adult (2 of 2 - PPSV23) 05/19/2018  . INFLUENZA VACCINE  Completed    Physical Exam: Vitals:   01/18/20 1346  BP: 108/68  Pulse: 100  Temp: 97.7 F (36.5 C)  SpO2: 97%  Weight: 123 lb 12.8 oz (56.2 kg)  Height: 6' (1.829 m)   Body mass index is 16.79 kg/m. Physical Exam  Constitutional: Oriented to person, place, and time. Well-developed and well-nourished.  HENT:  Head: Normocephalic.  Mouth/Throat: Oropharynx is clear and moist.  Eyes: Pupils are equal, round, and reactive to light.  Neck: Neck supple.  Cardiovascular: Normal rate and normal heart sounds.  No murmur heard.  Pulmonary/Chest: Effort normal and breath sounds normal. No respiratory distress. No wheezes. He  has no rales.  Abdominal: Soft. Bowel sounds are normal. No distension. There is no tenderness. There is no rebound.  Musculoskeletal: No edema.  Lymphadenopathy: none Neurological: Alert and oriented to person, place, and time. Gait is steady Skin: Skin is warm and dry.  Psychiatric: Normal mood and affect. Behavior is normal. Thought content normal.    Labs reviewed: Basic Metabolic Panel: Recent Labs    11/24/19 0235 11/24/19 0235 11/25/19 0104 11/25/19 0104 11/30/19 0000 12/05/19 0000 01/12/20 1356  NA 141   < > 144  --  142 141 138  K 3.7   < > 3.6   < > 3.9 4.1 4.3  CL 106   < > 102   < > 106 107 106  CO2 26   < > 31   < > 32* 30* 25  GLUCOSE 104*  --  110*  --   --   --  94  BUN 42*   < > 39*  --  17 15 20   CREATININE 0.81   < > 0.86  --  1.0 0.9 1.00  CALCIUM 8.3*   < > 9.3   < > 8.3* 8.0* 8.8  MG 2.4  --   --   --   --   --   --   TSH  --   --   --   --   --   --  1.46    < > = values in this interval not displayed.   Liver Function Tests: Recent Labs    11/23/19 0542 11/23/19 0542 11/24/19 0235 11/24/19 0235 11/25/19 0104 11/30/19 0000 01/12/20 1356  AST 67*   < > 39   < > 37 29 12  ALT 89*   < > 66*   < > 64* 56* 9  ALKPHOS 52   < > 45  --  49 39  --   BILITOT 1.1   < > 0.9  --  0.7  --  0.4  PROT 6.4*   < > 5.2*  --  5.5*  --  5.6*  ALBUMIN 3.1*  --  2.7*  --  2.9*  --   --    < > = values in this interval not displayed.   No results for input(s): LIPASE, AMYLASE in the last 8760 hours. No results for input(s): AMMONIA in the last 8760 hours. CBC: Recent Labs    11/24/19 0235 11/24/19 0235 11/25/19 0104 11/25/19 0104 11/30/19 0000 12/05/19 0000 01/12/20 1356  WBC 3.9*   < > 4.2  --  3.7 3.2 7.7  NEUTROABS 1.9   < > 2.2  --   --  1,261 3,688  HGB 13.3   < > 13.7   < > 12.0* 11.9* 12.9*  HCT 40.0   < > 40.7   < > 36* 35* 38.2*  MCV 94.8  --  95.3  --   --   --  92.3  PLT 266   < > 307   < > 301 159 245   < > = values in this interval not displayed.   Lipid Panel: Recent Labs    11/20/19 1203 01/12/20 1356  CHOL  --  150  HDL  --  62  LDLCALC  --  73  TRIG 91 66  CHOLHDL  --  2.4   No results found for: HGBA1C  Procedures since last visit:  No results found.  Assessment/Plan Post Covid Weakness Continue to encourage walking and staying Active Continue on Supplements Labs are stable Poor Appetite Was started on Remeron in the Acute Unit but he never took it Will discuss next visit . Weight so far is stable Anxiety Stable on Librium  Benign prostatic hyperplasia And H/o Bladder Cancer Doing well on Vesicare and Flomax Sees Urology Q  6 M Hyperlipidemia Continue on Statin LDL less then 100 B 12 def Continue Supplement Levels Normal  Labs/tests ordered:  * No order type specified * Next appt:  Visit date not found

## 2020-04-10 ENCOUNTER — Telehealth: Payer: Self-pay

## 2020-04-10 ENCOUNTER — Non-Acute Institutional Stay: Payer: Medicare Other | Admitting: Nurse Practitioner

## 2020-04-10 ENCOUNTER — Other Ambulatory Visit: Payer: Self-pay

## 2020-04-10 ENCOUNTER — Encounter: Payer: Self-pay | Admitting: Nurse Practitioner

## 2020-04-10 DIAGNOSIS — N400 Enlarged prostate without lower urinary tract symptoms: Secondary | ICD-10-CM | POA: Diagnosis not present

## 2020-04-10 DIAGNOSIS — F419 Anxiety disorder, unspecified: Secondary | ICD-10-CM

## 2020-04-10 DIAGNOSIS — R079 Chest pain, unspecified: Secondary | ICD-10-CM | POA: Insufficient documentation

## 2020-04-10 NOTE — Telephone Encounter (Signed)
Patient called complaining of chest pain that radiated up his arm and into his neck. He was advised to go to the ER/call 911, but he refused.  An appointment was made for 3:00 for him to see Man Darlina Rumpf in the clinic today.  Man Xie/Yavonda both notified of patient's concerns.

## 2020-04-10 NOTE — Assessment & Plan Note (Signed)
His mood is stable, continue Librium.

## 2020-04-10 NOTE — Assessment & Plan Note (Addendum)
Left side chest pain started last Friday, deep breathing brought it on, pain is not unbearable, denied cough, SOB, diaphoresis, fever, generalized malaise, change of appetite, nausea, vomiting, constipation, or diarrhea, denied sense of impending doom associated with the chest pain. He stated the pain has moved to his left shoulder that it hurts when slept on the left side, but no pain with ROM of the left shoulder. He admitted left shoulder pain with deep breathing upon my visit today. Left shoulder pain is located but no trigger point of tenderness noted. The patient stated he shopped at 3 stores yesterday, his pain in the left shoulder is improving today.  EKG in clinic [facility] today showed no significant from EKG 11/2019, will obtain  CXR, CBC/diff, CMP/eGFR to evaluate further. The patient is advised to ED if chest pain recurs or left shoulder pain worsens.

## 2020-04-10 NOTE — Progress Notes (Signed)
Location:   clinic [place] of Service:  Clinic (12) Provider: Marlana Latus NP  Code Status: DNR Goals of Care: [place] 11/29/2019  Does Patient Have a Medical Advance Directive? No  Type of Advance Directive -  Does patient want to make changes to medical advance directive? -  Copy of [place] in Chart? -  Would patient like information on creating a medical advance directive? -     Chief Complaint  Patient presents with  . Acute Visit    Patient states 4 days ago he started having pain in his right rib area, the next day it started radiating up to his shoulder. Now he still has some slight pain in his right clavical area only when he breathes in.     HPI: Patient is a 86 y.o. male seen today for an acute visit for chest pain started last Friday, deep breathing brought it on, pain is not unbearable, denied cough, SOB, diaphoresis, fever, generalized malaise, change of appetite, nausea, vomiting, constipation, or diarrhea, denied sense of impending doom. He stated the pain has moved to his left shoulder that it hurts when slept on the left side, but no pain with ROM of the left shoulder. He admitted left shoulder pain with deep breathing upon my visit today. Left shoulder pain is located but no trigger point of tenderness noted. The patient stated he shopped at 3 stores yesterday, his pain in the left shoulder is improving today.   Hx of BPH, on Flomax and Vesicare. Hx of anxiety, stable, on Librium,  21m qd.   Past Medical History:  Diagnosis Date  . Anxiety   . Asthma    as child  . Bladder cancer ([facility] urologist-  dr rElliot Gault([facility] [place]   dx 01/ 2016--- post TURBT and post Bladder bx 02-10-2017  . Bladder tumor   . BPH (benign prostatic hyperplasia)   . Frequency of urination   . History of asthma    childhood  . History of external beam radiation therapy    2002  prostate/ pelvis and boost w/ radioactive  prostate seed implants   . History of hypercalcemia    s/p  parathyroidectomy 2003  . History of prostate cancer current urologist-  dr rElliot Gault([facility] [place]-- per lov note (care everywhere) last PSA undetectable   dx 2002--  urologist-- dr dahlstedt/ oncologist dr mValere Dross--  Gleason 7 out of 10, PSA 4.9---post Radioactive Prostate Seed Implant and External Beam Radiation therapy  . History of skin cancer   . Hyperlipidemia   . Multiple lung nodules    since 2006--- followed by pcp --- dr kMaudie Mercury . Renal cyst, left   . Wears glasses   . Wears hearing aid in both ears     Past Surgical History:  Procedure Laterality Date  . CARDIOVASCULAR STRESS TEST  01/01/2004   normal nuclear perfustion study w/ no ischemia/  normal LV function and wall motion, ef 65%  . CATARACT EXTRACTION W/ INTRAOCULAR LENS  IMPLANT, BILATERAL  2010  . COLONOSCOPY    . CYSTO/  BILATERAL RETROGRADE PYELOGRAM/  BLADDER BX'S AND FULGERATION  02-10-2017   dr [facility]abel [facility]ene[place] [place] . CYSTOSCOPY W/ RETROGRADES Bilateral 07/27/2017   Procedure: CYSTOSCOPY,SELECTIVE CYTOLOGIES,RETROGRADE PYELOGRAM;  Surgeon: MCleon Gustin MD;  [facility]  Service: Urology;  Laterality: Bilateral;  . CYSTOSCOPY W/ RETROGRADES Bilateral 12/08/2017  Procedure: CYSTOSCOPY WITH RETROGRADE PYELOGRAM;  Surgeon: Cleon Gustin, MD;  Location: [facility];  Service: Urology;  Laterality: Bilateral;  . CYSTOSCOPY WITH BIOPSY N/A 07/27/2017   Procedure: CYSTOSCOPY WITH BIOPSY OF BLADDER AND PROSTATIC URETHRA;  Surgeon: Cleon Gustin, MD;  Location: [facility];  Service: Urology;  Laterality: N/A;  . CYSTOSCOPY WITH BIOPSY Bilateral 12/08/2017   Procedure: CYSTOSCOPY WITH RENAL WASHINGS;  Surgeon: Cleon Gustin, MD;  Location[facility];  Service: Urology;  Laterality: Bilateral;  . FIBEROPTIC BRONCHOSCOPY  08-20-2005   dr  wert   w/ Left lower lobe bx  . PARATHYROIDECTOMY  2003  . RADIOACTIVE PROSTATE SEED IMPLANTS  04-05-2001    dr Diona [facility]  . TONSILLECTOMY  child  . TRANSURETHRAL RESECTION OF BLADDER TUMOR  11-24-2014   dr Anabel Bene [facility] in [place], [place]  . TRANSURETHRAL RESECTION OF BLADDER TUMOR N/A 04/08/2018   Procedure: TRANSURETHRAL RESECTION OF BLADDER TUMOR (TURBT);  Surgeon: Cleon Gustin, MD;  Location: [facility];  Service: Urology;  Laterality: N/A;  . TRANSURETHRAL RESECTION OF PROSTATE N/A 02/28/2019   Procedure: TRANSURETHRAL RESECTION OF THE PROSTATE (TURP);  Surgeon: Cleon Gustin, MD;  Location: [facility];  Service: Urology;  Laterality: N/A;  30 MINS  . UPPER GI ENDOSCOPY      Allergies  Allergen Reactions  . Adhesive [Tape] Other (See Comments)    Tears skin off PAPER TAPE IS OKAY    Allergies as of 04/10/2020      Reactions   Adhesive [tape] Other (See Comments)   Tears skin off PAPER TAPE IS OKAY      Medication List       Accurate as of April 10, 2020  4:02 PM. If you have any questions, ask your nurse or doctor.        STOP taking these medications   NON FORMULARY Stopped by: Kalilah Barua X Ethelean Colla, NP     TAKE these medications   acetaminophen 325 MG tablet Commonly known as: TYLENOL Take 325 mg by mouth every 6 (six) hours as needed (for pain).   chlordiazePOXIDE 10 MG capsule Commonly known as: LIBRIUM Take 1 capsule (10 mg total) by mouth daily. In the morning   pravastatin 40 MG tablet Commonly known as: PRAVACHOL Take 40 mg by mouth daily. In the morning.   PRESERVISION AREDS 2 PO Take 1 tablet by mouth in the morning and at bedtime.   solifenacin 10 MG tablet Commonly known as: VESICARE Take 10 mg by mouth daily. In the morning.   tamsulosin 0.4 MG Caps capsule Commonly known as: FLOMAX Take 0.4 mg by mouth daily.   vitamin B-12 1000 MCG tablet Commonly known as: CYANOCOBALAMIN Take 1,000 mcg  by mouth daily.   Vitamin D3 25 MCG (1000 UT) Caps Take 1,000 Units by mouth daily.       Review of Systems:  Review of Systems  Constitutional: Negative for activity change, appetite change, chills, diaphoresis, fatigue and fever.  HENT: Positive for hearing loss. Negative for congestion and voice change.   Eyes: Negative for visual disturbance.  Respiratory: Negative for cough, choking, chest tightness, shortness of breath and wheezing.   Cardiovascular: Positive for chest pain. Negative for palpitations and leg swelling.       No longer chest pian today, stated his left shoulder pain with deep breathing today, but improved.   Gastrointestinal: Negative for abdominal distention, abdominal pain, constipation, diarrhea,  nausea and vomiting.  Genitourinary: Negative for difficulty urinating, dysuria and urgency.  Musculoskeletal: Positive for gait problem.  Skin: Negative for color change.  Neurological: Negative for speech difficulty, weakness, light-headedness and headaches.  Psychiatric/Behavioral: Negative for confusion, dysphoric mood and sleep disturbance. The patient is not nervous/anxious.     Health Maintenance  Topic Date Due  . TETANUS/TDAP  Never done  . PNA vac Low Risk Adult (2 of 2 - PPSV23) 05/19/2018  . INFLUENZA VACCINE  06/03/2020  . COVID-19 Vaccine  Completed    Physical Exam: Vitals:   04/10/20 1503  BP: 108/68  Pulse: (!) 103  Temp: (!) 97.3 F (36.3 C)  SpO2: 93%   There is no height or weight on file to calculate BMI. Physical Exam Vitals and nursing note reviewed.  Constitutional:      Appearance: Normal appearance.  HENT:     Head: Normocephalic and atraumatic.     Nose: Nose normal.     Mouth/Throat:     Mouth: Mucous membranes are moist.  Eyes:     Extraocular Movements: Extraocular movements intact.     Conjunctiva/sclera: Conjunctivae normal.     Pupils: Pupils are equal, round, and reactive to light.  Cardiovascular:     Rate and  Rhythm: Normal rate and regular rhythm.     Heart sounds: No murmur.     Comments: Baseline HR 90-100  Pulmonary:     Breath sounds: No wheezing, rhonchi or rales.  Chest:     Chest wall: No tenderness.  Abdominal:     General: Bowel sounds are normal. There is no distension.     Palpations: Abdomen is soft.     Tenderness: There is no abdominal tenderness. There is no right CVA tenderness, left CVA tenderness, guarding or rebound.  Musculoskeletal:        General: No tenderness.     Cervical back: Normal range of motion and neck supple.     Right lower leg: No edema.     Left lower leg: No edema.     Comments: No pain with ROM of the left shoulder. No trigger point tenderness of the left shoulder.   Skin:    General: Skin is warm and dry.  Neurological:     General: No focal deficit present.     Mental Status: He is alert and oriented to person, place, and time. Mental status is at baseline.     Motor: No weakness.     Coordination: Coordination normal.     Gait: Gait abnormal.  Psychiatric:        Mood and Affect: Mood normal.        Behavior: Behavior normal.        Thought Content: Thought content normal.        Judgment: Judgment normal.     Labs reviewed: Basic Metabolic Panel: Recent Labs    11/24/19 0235 11/24/19 0235 11/25/19 0104 11/25/19 0104 11/30/19 0000 12/05/19 0000 01/12/20 1356  NA 141   < > 144  --  142 141 138  K 3.7   < > 3.6   < > 3.9 4.1 4.3  CL 106   < > 102   < > 106 107 106  CO2 26   < > 31   < > 32* 30* 25  GLUCOSE 104*  --  110*  --   --   --  94  BUN 42*   < > 39*  --  17 15  20  CREATININE 0.81   < > 0.86  --  1.0 0.9 1.00  CALCIUM 8.3*   < > 9.3   < > 8.3* 8.0* 8.8  MG 2.4  --   --   --   --   --   --   TSH  --   --   --   --   --   --  1.46   < > = values in this interval not displayed.   Liver Function Tests: Recent Labs    11/23/19 0542 11/23/19 0542 11/24/19 0235 11/24/19 0235 11/25/19 0104 11/30/19 0000 01/12/20 1356    AST 67*   < > 39   < > 37 29 12  ALT 89*   < > 66*   < > 64* 56* 9  ALKPHOS 52   < > 45  --  49 39  --   BILITOT 1.1   < > 0.9  --  0.7  --  0.4  PROT 6.4*   < > 5.2*  --  5.5*  --  5.6*  ALBUMIN 3.1*  --  2.7*  --  2.9*  --   --    < > = values in this interval not displayed.   No results for input(s): LIPASE, AMYLASE in the last 8760 hours. No results for input(s): AMMONIA in the last 8760 hours. CBC: Recent Labs    11/24/19 0235 11/24/19 0235 11/25/19 0104 11/25/19 0104 11/30/19 0000 12/05/19 0000 01/12/20 1356  WBC 3.9*   < > 4.2  --  3.7 3.2 7.7  NEUTROABS 1.9   < > 2.2  --   --  1,261 3,688  HGB 13.3   < > 13.7   < > 12.0* 11.9* 12.9*  HCT 40.0   < > 40.7   < > 36* 35* 38.2*  MCV 94.8  --  95.3  --   --   --  92.3  PLT 266   < > 307   < > 301 159 245   < > = values in this interval not displayed.   Lipid Panel: Recent Labs    11/20/19 1203 01/12/20 1356  CHOL  --  150  HDL  --  62  LDLCALC  --  73  TRIG 91 66  CHOLHDL  --  2.4   No results found for: HGBA1C  Procedures since last visit: No results found.  Assessment/Plan Chest pain Left side chest pain started last Friday, deep breathing brought it on, pain is not unbearable, denied cough, SOB, diaphoresis, fever, generalized malaise, change of appetite, nausea, vomiting, constipation, or diarrhea, denied sense of impending doom associated with the chest pain. He stated the pain has moved to his left shoulder that it hurts when slept on the left side, but no pain with ROM of the left shoulder. He admitted left shoulder pain with deep breathing upon my visit today. Left shoulder pain is located but no trigger point of tenderness noted. The patient stated he shopped at 3 stores yesterday, his pain in the left shoulder is improving today.  EKG in clinic [facility] today showed no significant from EKG 11/2019, will obtain  CXR, CBC/diff, CMP/eGFR to evaluate further. The patient is advised to ED if chest pain recurs or left  shoulder pain worsens.   BPH (benign prostatic hyperplasia) Stable, no urinary retention, continue Flomax, Vesicare  Anxiety His mood is stable, continue Librium.     Labs/tests ordered:  EKG, CXR, CBC/diff,  CMP/eGFR  Next appt:  05/08/2020

## 2020-04-10 NOTE — Patient Instructions (Addendum)
EKG today showed no significant change comparing to EKG 11/2019. Will obtain CXR ap/lateral views, CBC/diff, CMP/eGFR 04/12/20. ED eval if chest pain recurs or worsened left shoulder pain.

## 2020-04-10 NOTE — Assessment & Plan Note (Signed)
Stable, no urinary retention, continue Flomax, Vesicare

## 2020-04-11 ENCOUNTER — Ambulatory Visit
Admission: RE | Admit: 2020-04-11 | Discharge: 2020-04-11 | Disposition: A | Discharge status: Home / Self Care | Payer: Medicare Other | Source: Ambulatory Visit | Attending: Nurse Practitioner | Admitting: Nurse Practitioner

## 2020-04-11 ENCOUNTER — Other Ambulatory Visit: Payer: Self-pay

## 2020-04-11 DIAGNOSIS — R079 Chest pain, unspecified: Secondary | ICD-10-CM

## 2020-04-12 ENCOUNTER — Other Ambulatory Visit: Payer: Medicare Other

## 2020-04-12 DIAGNOSIS — R079 Chest pain, unspecified: Secondary | ICD-10-CM

## 2020-04-12 LAB — CBC WITH DIFFERENTIAL/PLATELET
Absolute Monocytes: 955 cells/uL — ABNORMAL HIGH (ref 200–950)
Basophils Absolute: 17 cells/uL (ref 0–200)
Basophils Relative: 0.2 %
Eosinophils Absolute: 133 cells/uL (ref 15–500)
Eosinophils Relative: 1.6 %
HCT: 40.1 % (ref 38.5–50.0)
Hemoglobin: 13.5 g/dL (ref 13.2–17.1)
Lymphs Abs: 2125 cells/uL (ref 850–3900)
MCH: 30.1 pg (ref 27.0–33.0)
MCHC: 33.7 g/dL (ref 32.0–36.0)
MCV: 89.5 fL (ref 80.0–100.0)
MPV: 8.9 fL (ref 7.5–12.5)
Monocytes Relative: 11.5 %
Neutro Abs: 5071 cells/uL (ref 1500–7800)
Neutrophils Relative %: 61.1 %
Platelets: 319 10*3/uL (ref 140–400)
RBC: 4.48 10*6/uL (ref 4.20–5.80)
RDW: 13 % (ref 11.0–15.0)
Total Lymphocyte: 25.6 %
WBC: 8.3 10*3/uL (ref 3.8–10.8)

## 2020-04-12 LAB — COMPLETE METABOLIC PANEL WITH GFR
AG Ratio: 1.4 (calc) (ref 1.0–2.5)
ALT: 23 U/L (ref 9–46)
AST: 18 U/L (ref 10–35)
Albumin: 3.4 g/dL — ABNORMAL LOW (ref 3.6–5.1)
Alkaline phosphatase (APISO): 73 U/L (ref 35–144)
BUN/Creatinine Ratio: 17 (calc) (ref 6–22)
BUN: 20 mg/dL (ref 7–25)
CO2: 27 mmol/L (ref 20–32)
Calcium: 8.6 mg/dL (ref 8.6–10.3)
Chloride: 105 mmol/L (ref 98–110)
Creat: 1.18 mg/dL — ABNORMAL HIGH (ref 0.70–1.11)
GFR, Est African American: 65 mL/min/{1.73_m2} (ref >=60)
GFR, Est Non African American: 56 mL/min/{1.73_m2} — ABNORMAL LOW (ref >=60)
Globulin: 2.5 g/dL (calc) (ref 1.9–3.7)
Glucose, Bld: 92 mg/dL (ref 65–99)
Potassium: 4.2 mmol/L (ref 3.5–5.3)
Sodium: 143 mmol/L (ref 135–146)
Total Bilirubin: 0.3 mg/dL (ref 0.2–1.2)
Total Protein: 5.9 g/dL — ABNORMAL LOW (ref 6.1–8.1)

## 2020-04-30 ENCOUNTER — Telehealth: Payer: Self-pay

## 2020-04-30 NOTE — Telephone Encounter (Signed)
Patient called and states that he stopped eating because he's not hungry. Patient states that he's lost lots of weight but drinking plenty of water. Patient states that when his labs were discussed everything was well. However patient states that he want's to speak to you because he needs "Help". Please Advise.

## 2020-05-01 NOTE — Telephone Encounter (Signed)
I scheduled patient and called him to confirm. Patient was very thankful.

## 2020-05-01 NOTE — Telephone Encounter (Signed)
Discussed with the Patient . He is not doing well. Loosing weight c/o SOB with Weakness. Appetite is poor. Right now does not want to go to ED for Further Eval. Please add him to my clinic for tomorrow at 4.15 Pm Slot if possible. Call him to confirm the time

## 2020-05-02 ENCOUNTER — Inpatient Hospital Stay (HOSPITAL_COMMUNITY)
Admission: EM | Admit: 2020-05-02 | Discharge: 2020-05-04 | DRG: 194 | Disposition: A | Discharge status: Home / Self Care | Payer: Medicare Other | Attending: Internal Medicine | Admitting: Internal Medicine

## 2020-05-02 ENCOUNTER — Encounter: Payer: Self-pay | Admitting: Internal Medicine

## 2020-05-02 ENCOUNTER — Emergency Department (HOSPITAL_COMMUNITY): Payer: Medicare Other

## 2020-05-02 ENCOUNTER — Encounter (HOSPITAL_COMMUNITY): Payer: Self-pay | Admitting: Emergency Medicine

## 2020-05-02 ENCOUNTER — Other Ambulatory Visit: Payer: Self-pay

## 2020-05-02 ENCOUNTER — Non-Acute Institutional Stay: Payer: Medicare Other | Admitting: Internal Medicine

## 2020-05-02 VITALS — BP 88/56 | HR 115 | Temp 97.7°F | Ht 72.0 in | Wt 120.6 lb

## 2020-05-02 DIAGNOSIS — I9589 Other hypotension: Secondary | ICD-10-CM

## 2020-05-02 DIAGNOSIS — Z66 Do not resuscitate: Secondary | ICD-10-CM | POA: Diagnosis present

## 2020-05-02 DIAGNOSIS — J918 Pleural effusion in other conditions classified elsewhere: Secondary | ICD-10-CM | POA: Diagnosis present

## 2020-05-02 DIAGNOSIS — J189 Pneumonia, unspecified organism: Principal | ICD-10-CM | POA: Diagnosis present

## 2020-05-02 DIAGNOSIS — I313 Pericardial effusion (noninflammatory): Secondary | ICD-10-CM | POA: Diagnosis not present

## 2020-05-02 DIAGNOSIS — N289 Disorder of kidney and ureter, unspecified: Secondary | ICD-10-CM | POA: Diagnosis not present

## 2020-05-02 DIAGNOSIS — F419 Anxiety disorder, unspecified: Secondary | ICD-10-CM | POA: Diagnosis present

## 2020-05-02 DIAGNOSIS — I2729 Other secondary pulmonary hypertension: Secondary | ICD-10-CM | POA: Diagnosis not present

## 2020-05-02 DIAGNOSIS — R531 Weakness: Secondary | ICD-10-CM

## 2020-05-02 DIAGNOSIS — Z9889 Other specified postprocedural states: Secondary | ICD-10-CM

## 2020-05-02 DIAGNOSIS — Z85828 Personal history of other malignant neoplasm of skin: Secondary | ICD-10-CM | POA: Diagnosis not present

## 2020-05-02 DIAGNOSIS — R071 Chest pain on breathing: Secondary | ICD-10-CM | POA: Diagnosis not present

## 2020-05-02 DIAGNOSIS — Z8551 Personal history of malignant neoplasm of bladder: Secondary | ICD-10-CM

## 2020-05-02 DIAGNOSIS — R079 Chest pain, unspecified: Secondary | ICD-10-CM | POA: Diagnosis present

## 2020-05-02 DIAGNOSIS — E785 Hyperlipidemia, unspecified: Secondary | ICD-10-CM | POA: Diagnosis present

## 2020-05-02 DIAGNOSIS — N179 Acute kidney failure, unspecified: Secondary | ICD-10-CM | POA: Diagnosis present

## 2020-05-02 DIAGNOSIS — J440 Chronic obstructive pulmonary disease with acute lower respiratory infection: Secondary | ICD-10-CM | POA: Diagnosis present

## 2020-05-02 DIAGNOSIS — N400 Enlarged prostate without lower urinary tract symptoms: Secondary | ICD-10-CM | POA: Diagnosis present

## 2020-05-02 DIAGNOSIS — Z87891 Personal history of nicotine dependence: Secondary | ICD-10-CM

## 2020-05-02 DIAGNOSIS — I959 Hypotension, unspecified: Secondary | ICD-10-CM | POA: Diagnosis present

## 2020-05-02 DIAGNOSIS — R0602 Shortness of breath: Secondary | ICD-10-CM

## 2020-05-02 DIAGNOSIS — N183 Chronic kidney disease, stage 3 unspecified: Secondary | ICD-10-CM

## 2020-05-02 DIAGNOSIS — I272 Pulmonary hypertension, unspecified: Secondary | ICD-10-CM | POA: Diagnosis present

## 2020-05-02 DIAGNOSIS — R000 Tachycardia, unspecified: Secondary | ICD-10-CM

## 2020-05-02 DIAGNOSIS — J431 Panlobular emphysema: Secondary | ICD-10-CM | POA: Diagnosis not present

## 2020-05-02 DIAGNOSIS — E861 Hypovolemia: Secondary | ICD-10-CM

## 2020-05-02 DIAGNOSIS — Z8546 Personal history of malignant neoplasm of prostate: Secondary | ICD-10-CM

## 2020-05-02 DIAGNOSIS — Z8616 Personal history of COVID-19: Secondary | ICD-10-CM | POA: Diagnosis not present

## 2020-05-02 DIAGNOSIS — J90 Pleural effusion, not elsewhere classified: Secondary | ICD-10-CM

## 2020-05-02 DIAGNOSIS — R0902 Hypoxemia: Secondary | ICD-10-CM | POA: Diagnosis present

## 2020-05-02 DIAGNOSIS — I3139 Other pericardial effusion (noninflammatory): Secondary | ICD-10-CM | POA: Diagnosis present

## 2020-05-02 DIAGNOSIS — Z923 Personal history of irradiation: Secondary | ICD-10-CM | POA: Diagnosis not present

## 2020-05-02 LAB — TROPONIN I (HIGH SENSITIVITY)
Troponin I (High Sensitivity): 5 ng/L (ref <18)
Troponin I (High Sensitivity): 5 ng/L (ref <18)

## 2020-05-02 LAB — CBC
HCT: 41 % (ref 39.0–52.0)
Hemoglobin: 13.3 g/dL (ref 13.0–17.0)
MCH: 28.9 pg (ref 26.0–34.0)
MCHC: 32.4 g/dL (ref 30.0–36.0)
MCV: 89.1 fL (ref 80.0–100.0)
Platelets: 393 10*3/uL (ref 150–400)
RBC: 4.6 MIL/uL (ref 4.22–5.81)
RDW: 13.7 % (ref 11.5–15.5)
WBC: 14.2 10*3/uL — ABNORMAL HIGH (ref 4.0–10.5)
nRBC: 0 % (ref 0.0–0.2)

## 2020-05-02 LAB — BASIC METABOLIC PANEL
Anion gap: 12 (ref 5–15)
BUN: 25 mg/dL — ABNORMAL HIGH (ref 8–23)
CO2: 26 mmol/L (ref 22–32)
Calcium: 8.3 mg/dL — ABNORMAL LOW (ref 8.9–10.3)
Chloride: 103 mmol/L (ref 98–111)
Creatinine, Ser: 1.37 mg/dL — ABNORMAL HIGH (ref 0.61–1.24)
GFR calc Af Amer: 54 mL/min — ABNORMAL LOW (ref >60)
GFR calc non Af Amer: 46 mL/min — ABNORMAL LOW (ref >60)
Glucose, Bld: 124 mg/dL — ABNORMAL HIGH (ref 70–99)
Potassium: 4 mmol/L (ref 3.5–5.1)
Sodium: 141 mmol/L (ref 135–145)

## 2020-05-02 LAB — BRAIN NATRIURETIC PEPTIDE: B Natriuretic Peptide: 72.9 pg/mL (ref 0.0–100.0)

## 2020-05-02 MED ORDER — VANCOMYCIN HCL IN DEXTROSE 1-5 GM/200ML-% IV SOLN
1000.0000 mg | Freq: Once | INTRAVENOUS | Status: AC
  Administered 2020-05-02: 1000 mg via INTRAVENOUS
  Filled 2020-05-02: qty 200

## 2020-05-02 MED ORDER — IOHEXOL 350 MG/ML SOLN
75.0000 mL | Freq: Once | INTRAVENOUS | Status: AC | PRN
  Administered 2020-05-02: 75 mL via INTRAVENOUS

## 2020-05-02 MED ORDER — ACETAMINOPHEN 325 MG PO TABS: 650.0000 mg | ORAL_TABLET | Freq: Four times a day (QID) | ORAL | Status: DC | PRN

## 2020-05-02 MED ORDER — ONDANSETRON HCL 4 MG/2ML IJ SOLN: 4.0000 mg | Freq: Four times a day (QID) | INTRAMUSCULAR | Status: DC | PRN

## 2020-05-02 MED ORDER — ACETAMINOPHEN 650 MG RE SUPP: 650.0000 mg | Freq: Four times a day (QID) | RECTAL | Status: DC | PRN

## 2020-05-02 MED ORDER — ONDANSETRON HCL 4 MG PO TABS: 4.0000 mg | ORAL_TABLET | Freq: Four times a day (QID) | ORAL | Status: DC | PRN

## 2020-05-02 MED ORDER — SODIUM CHLORIDE 0.9 % IV SOLN: INTRAVENOUS | Status: AC

## 2020-05-02 MED ORDER — HYDROCODONE-ACETAMINOPHEN 5-325 MG PO TABS: 1.0000 | ORAL_TABLET | Freq: Four times a day (QID) | ORAL | Status: DC | PRN

## 2020-05-02 MED ORDER — PIPERACILLIN-TAZOBACTAM 3.375 G IVPB 30 MIN
3.3750 g | Freq: Once | INTRAVENOUS | Status: AC
  Administered 2020-05-02: 3.375 g via INTRAVENOUS
  Filled 2020-05-02: qty 50

## 2020-05-02 MED ORDER — SODIUM CHLORIDE 0.9 % IV SOLN
2.0000 g | Freq: Every day | INTRAVENOUS | Status: DC
  Administered 2020-05-03: 2 g via INTRAVENOUS
  Filled 2020-05-02 (×2): qty 20

## 2020-05-02 MED ORDER — METRONIDAZOLE IN NACL 5-0.79 MG/ML-% IV SOLN
500.0000 mg | Freq: Three times a day (TID) | INTRAVENOUS | Status: DC
  Administered 2020-05-03 (×3): 500 mg via INTRAVENOUS
  Filled 2020-05-02 (×3): qty 100

## 2020-05-02 MED ORDER — SODIUM CHLORIDE 0.9 % IV BOLUS
2000.0000 mL | Freq: Once | INTRAVENOUS | Status: AC
  Administered 2020-05-02: 2000 mL via INTRAVENOUS

## 2020-05-02 NOTE — H&P (Signed)
History and Physical    Jesse [name] DQQ:[id number] DOB: 1932/04/01 DOA: 05/02/2020  PCP: Virgie Dad, MD   Patient coming from: [facility]   Chief Complaint: Chest pain, SOB   HPI: Jesse [name] is a 86 y.o. male with medical history significant for anxiety, BPH, prostate cancer status post radiation, bladder cancer status post TURBT, and hyperlipidemia, now presenting to the emergency department for evaluation of chest pain and shortness of breath.  Patient reports that he was hospitalized in January 2021 with COVID-19 infection, reports that it was not until late April or early May that he felt completely recovered from that illness, and he continued to do well until just over 2 weeks ago when he developed left-sided chest pain, shortness of breath, and cough.  Patient describes pain at the lower left lateral chest wall that radiates up through his chest and back, becomes severe with deep breath or cough, but unchanged whether he is leaning forward or laying back.  This has been associated with shortness of breath and a cough.  He has not noticed any fevers or chills.  He denies any leg swelling or tenderness.  He was evaluated for this by his PCP 2 weeks ago with reassuring EKG and chest x-ray per report, but with continued worsening he was seen again today, found to be hypoxic, tachycardic, systolic blood pressure in the 80s, and was sent to the ED.  He denies abdominal pain, vomiting, or diarrhea, but has had very poor appetite for the past 2 weeks.  Patient reports that he quit smoking in 1986 after 35 years of 1 pack/day.  ED Course: Upon arrival to the ED, patient is found to be afebrile, saturating upper 80s on room air, mildly tachypneic, tachycardic in the 110s, and with blood pressure 88/56.  EKG features sinus tachycardia with rate 117 and chest x-ray with minimal right and mild left pleural effusion.  CTA chest is negative for PE but concerning for small right  pleural effusion and larger left pleural effusion that might be partially loculated.  Chemistry panel notable for creatinine 1.37 and CBC with leukocytosis to 14,200.  High-sensitivity troponin was normal x2 and BNP was normal.  Patient was given 2 L of saline, blood cultures were collected, and he was treated with vancomycin and Zosyn.  Review of Systems:  All other systems reviewed and apart from HPI, are negative.  Past Medical History:  Diagnosis Date  . Anxiety   . Asthma    as child  . Bladder cancer [facility]) urologist-  dr Elliot Gault [facility] in [place], [place])   dx 01/ 2016--- post TURBT and post Bladder bx 02-10-2017  . Bladder tumor   . BPH (benign prostatic hyperplasia)   . Frequency of urination   . History of asthma    childhood  . History of external beam radiation therapy    2002  prostate/ pelvis and boost w/ radioactive prostate seed implants   . History of hypercalcemia    s/p  parathyroidectomy 2003  . History of prostate cancer current urologist-  dr Elliot Gault [facility] in [place], [place])-- per lov note (care everywhere) last PSA undetectable   dx 2002--  urologist-- dr dahlstedt/ oncologist dr Valere Dross---  Gleason 7 out of 10, PSA 4.9---post Radioactive Prostate Seed Implant and External Beam Radiation therapy  . History of skin cancer   . Hyperlipidemia   . Multiple lung nodules    since 2006--- followed by pcp ---  dr Maudie Mercury  . Renal cyst, left   . Wears glasses   . Wears hearing aid in both ears     Past Surgical History:  Procedure Laterality Date  . CARDIOVASCULAR STRESS TEST  01/01/2004   normal nuclear perfustion study w/ no ischemia/  normal LV function and wall motion, ef 65%  . CATARACT EXTRACTION W/ INTRAOCULAR LENS  IMPLANT, BILATERAL  2010  . COLONOSCOPY    . CYSTO/  BILATERAL RETROGRADE PYELOGRAM/  BLADDER BX'S AND FULGERATION  02-10-2017   dr Anabel Bene [facility] in [place], [place]  . CYSTOSCOPY W/ RETROGRADES Bilateral 07/27/2017    Procedure: CYSTOSCOPY,SELECTIVE CYTOLOGIES,RETROGRADE PYELOGRAM;  Surgeon: Cleon Gustin, MD;  Location: [facility];  Service: Urology;  Laterality: Bilateral;  . CYSTOSCOPY W/ RETROGRADES Bilateral 12/08/2017   Procedure: CYSTOSCOPY WITH RETROGRADE PYELOGRAM;  Surgeon: Cleon Gustin, MD;  Location: [facility];  Service: Urology;  Laterality: Bilateral;  . CYSTOSCOPY WITH BIOPSY N/A 07/27/2017   Procedure: CYSTOSCOPY WITH BIOPSY OF BLADDER AND PROSTATIC URETHRA;  Surgeon: Cleon Gustin, MD;  Location: [facility];  Service: Urology;  Laterality: N/A;  . CYSTOSCOPY WITH BIOPSY Bilateral 12/08/2017   Procedure: CYSTOSCOPY WITH RENAL WASHINGS;  Surgeon: Cleon Gustin, MD;  Location: [facility];  Service: Urology;  Laterality: Bilateral;  . FIBEROPTIC BRONCHOSCOPY  08-20-2005   dr wert   w/ Left lower lobe bx  . PARATHYROIDECTOMY  2003  . RADIOACTIVE PROSTATE SEED IMPLANTS  04-05-2001    dr Diona Fanti [facility]  . TONSILLECTOMY  child  . TRANSURETHRAL RESECTION OF BLADDER TUMOR  11-24-2014   dr Anabel Bene [facility] in [place], [place]  . TRANSURETHRAL RESECTION OF BLADDER TUMOR N/A 04/08/2018   Procedure: TRANSURETHRAL RESECTION OF BLADDER TUMOR (TURBT);  Surgeon: Cleon Gustin, MD;  Location: [facility];  Service: Urology;  Laterality: N/A;  . TRANSURETHRAL RESECTION OF PROSTATE N/A 02/28/2019   Procedure: TRANSURETHRAL RESECTION OF THE PROSTATE (TURP);  Surgeon: Cleon Gustin, MD;  Location: [facility];  Service: Urology;  Laterality: N/A;  30 MINS  . UPPER GI ENDOSCOPY       reports that he quit smoking about 35 years ago. His smoking use included cigarettes. He quit after 50.00 years of use. He has never used smokeless tobacco. He reports current alcohol use of about 7.0 standard drinks of alcohol per week. He reports that he does not use drugs.  Allergies  Allergen  Reactions  . Adhesive [Tape] Other (See Comments)    Tears skin off PAPER TAPE IS OKAY    History reviewed. No pertinent family history.   Prior to Admission medications   Medication Sig Start Date End Date Taking? Authorizing Provider  acetaminophen (TYLENOL) 325 MG tablet Take 325 mg by mouth every 6 (six) hours as needed (for pain).   Yes [provider]  chlordiazePOXIDE (LIBRIUM) 10 MG capsule Take 1 capsule (10 mg total) by mouth daily. In the morning 11/29/19  Yes Virgie Dad, MD  Cholecalciferol (VITAMIN D3) 1000 units CAPS Take 1,000 Units by mouth daily.   Yes [provider]  Multiple Vitamins-Minerals (PRESERVISION AREDS 2 PO) Take 1 tablet by mouth in the morning and at bedtime.   Yes [provider]  pravastatin (PRAVACHOL) 40 MG tablet Take 40 mg by mouth daily. In the morning. 09/30/14  Yes [provider]  solifenacin (VESICARE) 10 MG tablet Take 10 mg by mouth daily. In the morning.  Yes [provider]  tamsulosin (FLOMAX) 0.4 MG CAPS capsule Take 0.4 mg by mouth daily.   Yes [provider]  vitamin B-12 (CYANOCOBALAMIN) 1000 MCG tablet Take 1,000 mcg by mouth daily.   Yes [provider]    Physical Exam: Vitals:   05/02/20 1824 05/02/20 1825 05/02/20 2005 05/02/20 2105  BP: 106/60  98/61 111/72  Pulse: (!) 118  (!) 116 (!) 106  Resp: (!) 22  20 18   Temp: 98.1 F (36.7 C)  98.1 F (36.7 C)   TempSrc: Oral  Oral   SpO2: 96%  97% 95%  Weight:  55 kg    Height:  6' (1.829 m)      Constitutional: NAD, calm, frail   Eyes: PERTLA, lids and conjunctivae normal ENMT: Mucous membranes are moist. Posterior pharynx clear of any exudate or lesions.   Neck: normal, supple, no masses, no thyromegaly Respiratory: Diminished at left base. Mild tachypnea. No pallor or cyanosis.  Cardiovascular: S1 & S2 heard, regular rate and rhythm. No extremity edema.   Abdomen: No distension, no tenderness, soft. Bowel  sounds active.  Musculoskeletal: no clubbing / cyanosis. No joint deformity upper and lower extremities.   Skin: no significant rashes, lesions, ulcers. Warm, dry, well-perfused. Neurologic: No facial asymmetry, gross hearing deficit. Sensation intact. Moving all extremities.  Psychiatric: Alert and oriented to person, place, and situation. Very pleasant and cooperative.    Labs and Imaging on Admission: I have personally reviewed following labs and imaging studies  CBC: Recent Labs  Lab 05/02/20 1858  WBC 14.2*  HGB 13.3  HCT 41.0  MCV 89.1  PLT 119   Basic Metabolic Panel: Recent Labs  Lab 05/02/20 1858  NA 141  K 4.0  CL 103  CO2 26  GLUCOSE 124*  BUN 25*  CREATININE 1.37*  CALCIUM 8.3*   GFR: Estimated Creatinine Clearance: 30.1 mL/min (A) (by C-G formula based on SCr of 1.37 mg/dL (H)). Liver Function Tests: No results for input(s): AST, ALT, ALKPHOS, BILITOT, PROT, ALBUMIN in the last 168 hours. No results for input(s): LIPASE, AMYLASE in the last 168 hours. No results for input(s): AMMONIA in the last 168 hours. Coagulation Profile: No results for input(s): INR, PROTIME in the last 168 hours. Cardiac Enzymes: No results for input(s): CKTOTAL, CKMB, CKMBINDEX, TROPONINI in the last 168 hours. BNP (last 3 results) No results for input(s): PROBNP in the last 8760 hours. HbA1C: No results for input(s): HGBA1C in the last 72 hours. CBG: No results for input(s): GLUCAP in the last 168 hours. Lipid Profile: No results for input(s): CHOL, HDL, LDLCALC, TRIG, CHOLHDL, LDLDIRECT in the last 72 hours. Thyroid Function Tests: No results for input(s): TSH, T4TOTAL, FREET4, T3FREE, THYROIDAB in the last 72 hours. Anemia Panel: No results for input(s): VITAMINB12, FOLATE, FERRITIN, TIBC, IRON, RETICCTPCT in the last 72 hours. Urine analysis:    Component Value Date/Time   COLORURINE RED (A) 02/26/2019 1953   APPEARANCEUR TURBID (A) 02/26/2019 1953   LABSPEC   02/26/2019 1953    TEST NOT REPORTED DUE TO COLOR INTERFERENCE OF URINE PIGMENT   PHURINE  02/26/2019 1953    TEST NOT REPORTED DUE TO COLOR INTERFERENCE OF URINE PIGMENT   GLUCOSEU (A) 02/26/2019 1953    TEST NOT REPORTED DUE TO COLOR INTERFERENCE OF URINE PIGMENT   HGBUR (A) 02/26/2019 1953    TEST NOT REPORTED DUE TO COLOR INTERFERENCE OF URINE PIGMENT   BILIRUBINUR (A) 02/26/2019 1953    TEST NOT  REPORTED DUE TO COLOR INTERFERENCE OF URINE PIGMENT   KETONESUR (A) 02/26/2019 1953    TEST NOT REPORTED DUE TO COLOR INTERFERENCE OF URINE PIGMENT   PROTEINUR (A) 02/26/2019 1953    TEST NOT REPORTED DUE TO COLOR INTERFERENCE OF URINE PIGMENT   UROBILINOGEN 0.2 10/21/2014 0302   NITRITE (A) 02/26/2019 1953    TEST NOT REPORTED DUE TO COLOR INTERFERENCE OF URINE PIGMENT   LEUKOCYTESUR (A) 02/26/2019 1953    TEST NOT REPORTED DUE TO COLOR INTERFERENCE OF URINE PIGMENT   Sepsis Labs: @LABRCNTIP (procalcitonin:4,lacticidven:4) )No results found for this or any previous visit (from the past 240 hour(s)).   Radiological Exams on Admission: DG Chest 2 View  Result Date: 05/02/2020 CLINICAL DATA:  Shortness of breath. EXAM: CHEST - 2 VIEW COMPARISON:  April 11, 2020. FINDINGS: The heart size and mediastinal contours are within normal limits. No pneumothorax is noted. Minimal right pleural effusion is noted. Mild left pleural effusion is noted with associated atelectasis or infiltrate. The visualized skeletal structures are unremarkable. IMPRESSION: Minimal right pleural effusion. Mild left pleural effusion with associated atelectasis or infiltrate. Electronically Signed   By: Marijo Conception M.D.   On: 05/02/2020 19:26   CT Angio Chest PE W and/or Wo Contrast  Result Date: 05/02/2020 CLINICAL DATA:  Shortness of breath, hypotension, tachycardia, hypoxia EXAM: CT ANGIOGRAPHY CHEST WITH CONTRAST TECHNIQUE: Multidetector CT imaging of the chest was performed using the standard protocol during bolus  administration of intravenous contrast. Multiplanar CT image reconstructions and MIPs were obtained to evaluate the vascular anatomy. CONTRAST:  68mL OMNIPAQUE IOHEXOL 350 MG/ML SOLN COMPARISON:  05/02/2020, 05/31/2007 FINDINGS: Cardiovascular: This is a technically adequate evaluation of the pulmonary vasculature. No filling defects or pulmonary emboli. There is a new pericardial effusion measuring up to 1 cm in thickness. Echocardiography may be useful for further evaluation. The thoracic aorta is normal in caliber, with minimal atherosclerosis. No evidence of dissection. Mediastinum/Nodes: No enlarged mediastinal, hilar, or axillary lymph nodes. Thyroid gland, trachea, and esophagus demonstrate no significant findings. Lungs/Pleura: There are bilateral pleural effusions, left greater than right. Volume on the left less than 1 L, and volume on the right less than 100 cc. The left-sided effusion may be partially loculated. Extensive background emphysema and scarring. Dependent atelectasis within the lower lobes. No acute airspace disease. No pneumothorax. Upper Abdomen: No acute abnormality. Musculoskeletal: No acute or destructive bony lesions. There is extensive spondylosis throughout the visualized thoracolumbar spine with bridging anterior osteophytes. Reconstructed images demonstrate no additional findings. Review of the MIP images confirms the above findings. IMPRESSION: 1. Small pericardial effusion. Echocardiography may be useful for further evaluation. 2. Bilateral pleural effusions, left greater than right. Left-sided effusion is partially loculated. 3. No evidence of pulmonary embolus. 4. Aortic Atherosclerosis (ICD10-I70.0) and Emphysema (ICD10-J43.9). Electronically Signed   By: Randa Ngo M.D.   On: 05/02/2020 21:41    EKG: Independently reviewed. Sinus tachycardia, rate 117.   Assessment/Plan  1. Pleural effusions  - Presents with at least 2 weeks of left-sided pleuritic pain and SOB and  is found to have pleural effusions bilaterally, mainly on the left where there are findings suggestive of pneumonia  - Suspect this is parapneumonic; he has 35 pack-yr smoking hx and malignant etiology also considered   - Blood cultures were collected in ED and he was given vancomycin and Zosyn  - Add sputum culture, procalctionin, and strep pneumo antigens, consult IR for thoracentesis with fluid analysis, continue antibiotic coverage with Rocephin and  Flagyl, follow cultures and clinical course    2. Hypotension  - SBP was in the 80s initially, now 110 with IVF and was likely secondary to hypovolemia in setting of recent anorexia  - Continue IVF hydration, antibiotics    3. Renal insufficiency  - Scr is 1.37 on admission, up from 1.18 on 6/10 and 0.8-0.9 prior to that  - Likely related to recent anorexia and low BP  - Continue IVF hydration, renally-dose medications, repeat chem panel in am    4. Anxiety  - Stable, continue benzodiazepine    5. Pericardial effusion  - Noted on CT in ED, no tamponade, check echocardiogram and inflammatory markers   6. Chest pain - Present for >2 wks, HS troponin negative x2 and CTA negative for PE  - Likely secondary to the pleural effusion, reassess after treatment of that    DVT prophylaxis: SCDs Code Status: DNR, confirmed with patient  Family Communication: Discussed with patient    Disposition Plan:  Patient is from: [facility] Anticipated d/c is to: TBD Anticipated d/c date is: 05/05/20 Patient currently: pending thoracentesis, echocardiogram  Consults called: None  Admission status: Inpatient     Vianne Bulls, MD [facility] Pager: See [email or web address]  If 7AM-7PM, please contact the daytime attending [email or web address]  05/02/2020, 11:28 PM

## 2020-05-02 NOTE — ED Notes (Signed)
PT to CT.

## 2020-05-02 NOTE — ED Triage Notes (Signed)
Pateitn sent here by PCP, told to go to ED for hypotension, tachycardia and hypoxia. Patient has a HR in the 120s, SOB with oxygen at 100% RA.

## 2020-05-02 NOTE — Progress Notes (Signed)
Location: [place] of Service:  Clinic (12)  Provider:   Code Status:  Goals of Care:  Advanced Directives 11/29/2019  Does Patient Have a Medical Advance Directive? No  Type of Advance Directive -  Does patient want to make changes to medical advance directive? -  Copy of [place] in Chart? -  Would patient like information on creating a medical advance directive? -     Chief Complaint  Patient presents with  . Acute Visit    Patient returns to the clinic complaining of pain in lower neck shooting down his shoulders and down back. He is losing weight, has SOB and weakness/fatigue.    HPI: Patient is a 86 y.o. male seen today for an acute visit for SOB Chest Pain Weakness   Patient has h/o Covid Pneumonia BPH and h/o Bladder cancer, HLD and Depression  Was seen 2 weeks ago for Chest Pain. EKG was negative. Xray showed old Covid scarring no Acute changes He called yesterday c/o Worsening SOB, Loosing weight feeling weak. Sleeping all the time. No Appetite Came to the clinic was SOB. POX 88 on RA. Very weak. C/O Pain when takes deep breadth. HR was 115 and BP 88/56 Looks Fatigue  Past Medical History:  Diagnosis Date  . Anxiety   . Asthma    as child  . Bladder cancer [facility]) urologist-  dr Elliot Gault [facility] in [place], [place])   dx 01/ 2016--- post TURBT and post Bladder bx 02-10-2017  . Bladder tumor   . BPH (benign prostatic hyperplasia)   . Frequency of urination   . History of asthma    childhood  . History of external beam radiation therapy    2002  prostate/ pelvis and boost w/ radioactive prostate seed implants   . History of hypercalcemia    s/p  parathyroidectomy 2003  . History of prostate cancer current urologist-  dr Elliot Gault [facility] in [place], [place])-- per lov note (care everywhere) last PSA undetectable   dx 2002--  urologist-- dr dahlstedt/ oncologist dr Valere Dross---  Gleason 7 out of 10, PSA  4.9---post Radioactive Prostate Seed Implant and External Beam Radiation therapy  . History of skin cancer   . Hyperlipidemia   . Multiple lung nodules    since 2006--- followed by pcp --- dr Maudie Mercury  . Renal cyst, left   . Wears glasses   . Wears hearing aid in both ears     Past Surgical History:  Procedure Laterality Date  . CARDIOVASCULAR STRESS TEST  01/01/2004   normal nuclear perfustion study w/ no ischemia/  normal LV function and wall motion, ef 65%  . CATARACT EXTRACTION W/ INTRAOCULAR LENS  IMPLANT, BILATERAL  2010  . COLONOSCOPY    . CYSTO/  BILATERAL RETROGRADE PYELOGRAM/  BLADDER BX'S AND FULGERATION  02-10-2017   dr Anabel Bene [facility] in [place], [place]  . CYSTOSCOPY W/ RETROGRADES Bilateral 07/27/2017   Procedure: CYSTOSCOPY,SELECTIVE CYTOLOGIES,RETROGRADE PYELOGRAM;  Surgeon: Cleon Gustin, MD;  Location: [facility];  Service: Urology;  Laterality: Bilateral;  . CYSTOSCOPY W/ RETROGRADES Bilateral 12/08/2017   Procedure: CYSTOSCOPY WITH RETROGRADE PYELOGRAM;  Surgeon: Cleon Gustin, MD;  Location: [facility];  Service: Urology;  Laterality: Bilateral;  . CYSTOSCOPY WITH BIOPSY N/A 07/27/2017   Procedure: CYSTOSCOPY WITH BIOPSY OF BLADDER AND PROSTATIC URETHRA;  Surgeon: Cleon Gustin, MD;  Location: [facility];  Service: Urology;  Laterality: N/A;  .  CYSTOSCOPY WITH BIOPSY Bilateral 12/08/2017   Procedure: CYSTOSCOPY WITH RENAL WASHINGS;  Surgeon: Cleon Gustin, MD;  Location: [facility];  Service: Urology;  Laterality: Bilateral;  . FIBEROPTIC BRONCHOSCOPY  08-20-2005   dr wert   w/ Left lower lobe bx  . PARATHYROIDECTOMY  2003  . RADIOACTIVE PROSTATE SEED IMPLANTS  04-05-2001    dr Diona Fanti [facility]  . TONSILLECTOMY  child  . TRANSURETHRAL RESECTION OF BLADDER TUMOR  11-24-2014   dr Anabel Bene [facility] in [place], [place]  . TRANSURETHRAL RESECTION OF BLADDER TUMOR N/A  04/08/2018   Procedure: TRANSURETHRAL RESECTION OF BLADDER TUMOR (TURBT);  Surgeon: Cleon Gustin, MD;  Location: [facility];  Service: Urology;  Laterality: N/A;  . TRANSURETHRAL RESECTION OF PROSTATE N/A 02/28/2019   Procedure: TRANSURETHRAL RESECTION OF THE PROSTATE (TURP);  Surgeon: Cleon Gustin, MD;  Location: [facility];  Service: Urology;  Laterality: N/A;  30 MINS  . UPPER GI ENDOSCOPY      Allergies  Allergen Reactions  . Adhesive [Tape] Other (See Comments)    Tears skin off PAPER TAPE IS OKAY    Outpatient Encounter Medications as of 05/02/2020  Medication Sig  . acetaminophen (TYLENOL) 325 MG tablet Take 325 mg by mouth every 6 (six) hours as needed (for pain).  . chlordiazePOXIDE (LIBRIUM) 10 MG capsule Take 1 capsule (10 mg total) by mouth daily. In the morning  . Cholecalciferol (VITAMIN D3) 1000 units CAPS Take 1,000 Units by mouth daily.  . Multiple Vitamins-Minerals (PRESERVISION AREDS 2 PO) Take 1 tablet by mouth in the morning and at bedtime.  . pravastatin (PRAVACHOL) 40 MG tablet Take 40 mg by mouth daily. In the morning.  . solifenacin (VESICARE) 10 MG tablet Take 10 mg by mouth daily. In the morning.  . tamsulosin (FLOMAX) 0.4 MG CAPS capsule Take 0.4 mg by mouth daily.  . vitamin B-12 (CYANOCOBALAMIN) 1000 MCG tablet Take 1,000 mcg by mouth daily.   No facility-administered encounter medications on file as of 05/02/2020.    Review of Systems:  Review of Systems  Constitutional: Positive for activity change, appetite change and unexpected weight change.  HENT: Negative.   Respiratory: Positive for shortness of breath.   Cardiovascular: Positive for chest pain.  Gastrointestinal: Negative for diarrhea.  Genitourinary: Negative for dysuria, flank pain and frequency.  Musculoskeletal: Positive for arthralgias, back pain, myalgias and neck pain.  Skin: Negative.   Neurological: Positive for weakness.  Psychiatric/Behavioral: Negative.      Health Maintenance  Topic Date Due  . TETANUS/TDAP  Never done  . PNA vac Low Risk Adult (2 of 2 - PPSV23) 05/19/2018  . INFLUENZA VACCINE  06/03/2020  . COVID-19 Vaccine  Completed    Physical Exam: Vitals:   05/02/20 1619  BP: (!) 88/56  Pulse: (!) 115  SpO2: (!) 89%  Weight: 120 lb 9.6 oz (54.7 kg)  Height: 6' (1.829 m)   Body mass index is 16.36 kg/m. Physical Exam Vitals reviewed.  Constitutional:      Comments: Look Weak and very slow  HENT:     Head: Normocephalic.     Nose: Nose normal.     Mouth/Throat:     Mouth: Mucous membranes are dry.  Eyes:     Pupils: Pupils are equal, round, and reactive to light.  Cardiovascular:     Rate and Rhythm: Tachycardia present.     Heart sounds: No murmur heard.   Pulmonary:  Breath sounds: No wheezing or rales.     Comments: Seems SOB Abdominal:     General: Abdomen is flat. Bowel sounds are normal.     Palpations: Abdomen is soft.  Musculoskeletal:        General: No swelling.     Cervical back: Neck supple.  Skin:    General: Skin is warm.  Neurological:     General: No focal deficit present.     Mental Status: He is alert and oriented to person, place, and time.  Psychiatric:        Mood and Affect: Mood normal.       Labs reviewed: Basic Metabolic Panel: Recent Labs    11/24/19 0235 11/24/19 0235 11/25/19 0104 11/30/19 0000 12/05/19 0000 01/12/20 1356 04/12/20 1401  NA 141   < > 144   < > 141 138 143  K 3.7   < > 3.6   < > 4.1 4.3 4.2  CL 106   < > 102   < > 107 106 105  CO2 26   < > 31   < > 30* 25 27  GLUCOSE 104*   < > 110*  --   --  94 92  BUN 42*   < > 39*   < > 15 20 20   CREATININE 0.81   < > 0.86   < > 0.9 1.00 1.18*  CALCIUM 8.3*   < > 9.3   < > 8.0* 8.8 8.6  MG 2.4  --   --   --   --   --   --   TSH  --   --   --   --   --  1.46  --    < > = values in this interval not displayed.   Liver Function Tests: Recent Labs    11/23/19 0542 11/23/19 0542 11/24/19 0235  11/24/19 0235 11/25/19 0104 11/25/19 0104 11/30/19 0000 01/12/20 1356 04/12/20 1401  AST 67*   < > 39   < > 37   < > 29 12 18   ALT 89*   < > 66*   < > 64*   < > 56* 9 23  ALKPHOS 52   < > 45  --  49  --  39  --   --   BILITOT 1.1   < > 0.9   < > 0.7  --   --  0.4 0.3  PROT 6.4*   < > 5.2*   < > 5.5*  --   --  5.6* 5.9*  ALBUMIN 3.1*  --  2.7*  --  2.9*  --   --   --   --    < > = values in this interval not displayed.   No results for input(s): LIPASE, AMYLASE in the last 8760 hours. No results for input(s): AMMONIA in the last 8760 hours. CBC: Recent Labs    11/25/19 0104 11/30/19 0000 12/05/19 0000 01/12/20 1356 04/12/20 1401  WBC 4.2   < > 3.2 7.7 8.3  NEUTROABS 2.2  --  1,261 3,688 5,071  HGB 13.7   < > 11.9* 12.9* 13.5  HCT 40.7   < > 35* 38.2* 40.1  MCV 95.3  --   --  92.3 89.5  PLT 307   < > 159 245 319   < > = values in this interval not displayed.   Lipid Panel: Recent Labs    11/20/19 1203 01/12/20 1356  CHOL  --  150  HDL  --  62  LDLCALC  --  73  TRIG 91 66  CHOLHDL  --  2.4   No results found for: HGBA1C  Procedures since last visit: DG Chest 2 View  Result Date: 04/12/2020 CLINICAL DATA:  Left shoulder/chest pain. Left upper chest pain and shoulder pain for 7 days. History of COVID January 2021. Asthma. EXAM: CHEST - 2 VIEW COMPARISON:  Chest radiograph 11/20/2019. Chest CT 05/31/2007 FINDINGS: Chronic hyperinflation and emphysema. Blunting of the right costophrenic angle suggestive of small effusion. Patchy opacities at the left lung base and periphery of the upper lobes, likely scarring/sequela of prior COVID-19 pneumonia. The heart is normal in size. Normal mediastinal contours. No pneumothorax, pulmonary edema, or confluent airspace disease. Surgical clips at the right thoracic inlet. No pulmonary mass. IMPRESSION: 1. Blunting of the right costophrenic angle suggestive of small effusion. 2. Chronic hyperinflation and emphysema. 3. Mild patchy  opacities at the left lung base and periphery of the upper lobes, likely scarring/sequela of prior COVID-19 pneumonia. Improvement at the left lung base from January exam. Electronically Signed   By: Keith Rake M.D.   On: 04/12/2020 11:38    Assessment/Plan  SOB with Chest Pain No Fever or Rales on Exam Has been taking Tylenol to help his Pain ? PE or Pneumonia oR Cardiac. He does not want me to call EMS. Does not want me to send him to ED. We discussed that he needs Urgent Care. And his options. He called his Son who is going to take him to the ED for further EVAl   Labs/tests ordered:  * No order type specified * Next appt:  05/08/2020

## 2020-05-02 NOTE — ED Provider Notes (Addendum)
[facility] DEPT Provider Note   CSN: [id number] Arrival date & time: 05/02/20  1757     History Chief Complaint  Patient presents with   Shortness of Breath   Fatigue    Jesse [name] is a 86 y.o. male.  Patient complains of cough and shortness of breath.  No fever chills  The history is provided by the patient and medical records. No language interpreter was used.  Shortness of Breath Severity:  Mild Onset quality:  Sudden Timing:  Constant Progression:  Resolved Chronicity:  New Context: not activity   Relieved by:  Nothing Worsened by:  Nothing Ineffective treatments:  None tried Associated symptoms: no abdominal pain, no chest pain, no cough, no headaches and no rash        Past Medical History:  Diagnosis Date   Anxiety    Asthma    as child   Bladder cancer [facility]) urologist-  dr Elliot Gault [facility] in [place], [place])   dx 01/ 2016--- post TURBT and post Bladder bx 02-10-2017   Bladder tumor    BPH (benign prostatic hyperplasia)    Frequency of urination    History of asthma    childhood   History of external beam radiation therapy    2002  prostate/ pelvis and boost w/ radioactive prostate seed implants    History of hypercalcemia    s/p  parathyroidectomy 2003   History of prostate cancer current urologist-  dr Elliot Gault [facility] in [place], [place])-- per lov note (care everywhere) last PSA undetectable   dx 2002--  urologist-- dr dahlstedt/ oncologist dr Valere Dross---  Gleason 7 out of 10, PSA 4.9---post Radioactive Prostate Seed Implant and External Beam Radiation therapy   History of skin cancer    Hyperlipidemia    Multiple lung nodules    since 2006--- followed by pcp --- dr kim   Renal cyst, left    Wears glasses    Wears hearing aid in both ears     Patient Active Problem List   Diagnosis Date Noted   Chest pain 04/10/2020   Hypotension 11/28/2019   Anxiety  11/28/2019   Poor appetite 11/28/2019   Acute respiratory failure due to COVID-19 ([place]) 11/20/2019   BPH (benign prostatic hyperplasia) 11/20/2019   History of prostate cancer 11/20/2019   Acute respiratory failure ([place]) 11/20/2019    Past Surgical History:  Procedure Laterality Date   CARDIOVASCULAR STRESS TEST  01/01/2004   normal nuclear perfustion study w/ no ischemia/  normal LV function and wall motion, ef 65%   CATARACT EXTRACTION W/ INTRAOCULAR LENS  IMPLANT, BILATERAL  2010   COLONOSCOPY     CYSTO/  BILATERAL RETROGRADE PYELOGRAM/  BLADDER BX'S AND FULGERATION  02-10-2017   dr Anabel Bene [facility] in [place], [place] W/ RETROGRADES Bilateral 07/27/2017   Procedure: CYSTOSCOPY,SELECTIVE CYTOLOGIES,RETROGRADE PYELOGRAM;  Surgeon: Cleon Gustin, MD;  Location: [facility];  Service: Urology;  Laterality: Bilateral;   CYSTOSCOPY W/ RETROGRADES Bilateral 12/08/2017   Procedure: CYSTOSCOPY WITH RETROGRADE PYELOGRAM;  Surgeon: Cleon Gustin, MD;  Location: [facility];  Service: Urology;  Laterality: Bilateral;   CYSTOSCOPY WITH BIOPSY N/A 07/27/2017   Procedure: CYSTOSCOPY WITH BIOPSY OF BLADDER AND PROSTATIC URETHRA;  Surgeon: Cleon Gustin, MD;  Location: [facility];  Service: Urology;  Laterality: N/A;   CYSTOSCOPY WITH BIOPSY Bilateral 12/08/2017   Procedure: CYSTOSCOPY WITH RENAL WASHINGS;  Surgeon: Cleon Gustin, MD;  Location: [facility];  Service: Urology;  Laterality: Bilateral;   FIBEROPTIC BRONCHOSCOPY  08-20-2005   dr wert   w/ Left lower lobe bx   PARATHYROIDECTOMY  2003   RADIOACTIVE PROSTATE SEED IMPLANTS  04-05-2001    dr Diona Fanti [place]  child   TRANSURETHRAL RESECTION OF BLADDER TUMOR  11-24-2014   dr Anabel Bene [facility] in [place], [place] OF BLADDER TUMOR N/A 04/08/2018   Procedure: TRANSURETHRAL  RESECTION OF BLADDER TUMOR (TURBT);  Surgeon: Cleon Gustin, MD;  Location: [facility];  Service: Urology;  Laterality: N/A;   TRANSURETHRAL RESECTION OF PROSTATE N/A 02/28/2019   Procedure: TRANSURETHRAL RESECTION OF THE PROSTATE (TURP);  Surgeon: Cleon Gustin, MD;  Location: [facility];  Service: Urology;  Laterality: N/A;  30 MINS   UPPER GI ENDOSCOPY         No family history on file.  Social History   Tobacco Use   Smoking status: Former Smoker    Years: 50.00    Types: Cigarettes    Quit date: 11/03/1984    Years since quitting: 35.5   Smokeless tobacco: Never Used  Vaping Use   Vaping Use: Never used  Substance Use Topics   Alcohol use: Yes    Alcohol/week: 7.0 standard drinks    Types: 7 Glasses of wine per week    Comment: daily wine   Drug use: No    Home Medications Prior to Admission medications   Medication Sig Start Date End Date Taking? Authorizing Provider  acetaminophen (TYLENOL) 325 MG tablet Take 325 mg by mouth every 6 (six) hours as needed (for pain).   Yes [provider]  chlordiazePOXIDE (LIBRIUM) 10 MG capsule Take 1 capsule (10 mg total) by mouth daily. In the morning 11/29/19  Yes Virgie Dad, MD  Cholecalciferol (VITAMIN D3) 1000 units CAPS Take 1,000 Units by mouth daily.   Yes [provider]  Multiple Vitamins-Minerals (PRESERVISION AREDS 2 PO) Take 1 tablet by mouth in the morning and at bedtime.   Yes [provider]  pravastatin (PRAVACHOL) 40 MG tablet Take 40 mg by mouth daily. In the morning. 09/30/14  Yes [provider]  solifenacin (VESICARE) 10 MG tablet Take 10 mg by mouth daily. In the morning.   Yes [provider]  tamsulosin (FLOMAX) 0.4 MG CAPS capsule Take 0.4 mg by mouth daily.   Yes [provider]  vitamin B-12 (CYANOCOBALAMIN) 1000 MCG tablet Take 1,000 mcg by mouth daily.   Yes [provider]    Allergies    Adhesive  [tape]  Review of Systems   Review of Systems  Constitutional: Negative for appetite change and fatigue.  HENT: Negative for congestion, ear discharge and sinus pressure.   Eyes: Negative for discharge.  Respiratory: Positive for shortness of breath. Negative for cough.   Cardiovascular: Negative for chest pain.  Gastrointestinal: Negative for abdominal pain and diarrhea.  Genitourinary: Negative for frequency and hematuria.  Musculoskeletal: Negative for back pain.  Skin: Negative for rash.  Neurological: Negative for seizures and headaches.  Psychiatric/Behavioral: Negative for hallucinations.    Physical Exam Updated Vital Signs BP 111/72    Pulse (!) 106    Temp 98.1 F (36.7 C) (Oral)    Resp 18    Ht 6' (1.829 m)    Wt 55 kg    SpO2 95%    BMI 16.44 kg/m   Physical  Exam Vitals and nursing note reviewed.  Constitutional:      Appearance: He is well-developed.  HENT:     Head: Normocephalic.     Nose: Nose normal.  Eyes:     General: No scleral icterus.    Conjunctiva/sclera: Conjunctivae normal.  Neck:     Thyroid: No thyromegaly.  Cardiovascular:     Rate and Rhythm: Normal rate and regular rhythm.     Heart sounds: No murmur heard.  No friction rub. No gallop.   Pulmonary:     Breath sounds: No stridor. No wheezing or rales.  Chest:     Chest wall: No tenderness.  Abdominal:     General: There is no distension.     Tenderness: There is no abdominal tenderness. There is no rebound.  Musculoskeletal:        General: Normal range of motion.     Cervical back: Neck supple.  Lymphadenopathy:     Cervical: No cervical adenopathy.  Skin:    Findings: No erythema or rash.  Neurological:     Mental Status: He is alert and oriented to person, place, and time.     Motor: No abnormal muscle tone.     Coordination: Coordination normal.  Psychiatric:        Behavior: Behavior normal.     ED Results / Procedures / Treatments   Labs (all labs ordered are listed,  but only abnormal results are displayed) Labs Reviewed  BASIC METABOLIC PANEL - Abnormal; Notable for the following components:      Result Value   Glucose, Bld 124 (*)    BUN 25 (*)    Creatinine, Ser 1.37 (*)    Calcium 8.3 (*)    GFR calc non Af Amer 46 (*)    GFR calc Af Amer 54 (*)    All other components within normal limits  CBC - Abnormal; Notable for the following components:   WBC 14.2 (*)    All other components within normal limits  CULTURE, BLOOD (ROUTINE X 2)  CULTURE, BLOOD (ROUTINE X 2)  BRAIN NATRIURETIC PEPTIDE  TROPONIN I (HIGH SENSITIVITY)  TROPONIN I (HIGH SENSITIVITY)    EKG None  Radiology DG Chest 2 View  Result Date: 05/02/2020 CLINICAL DATA:  Shortness of breath. EXAM: CHEST - 2 VIEW COMPARISON:  April 11, 2020. FINDINGS: The heart size and mediastinal contours are within normal limits. No pneumothorax is noted. Minimal right pleural effusion is noted. Mild left pleural effusion is noted with associated atelectasis or infiltrate. The visualized skeletal structures are unremarkable. IMPRESSION: Minimal right pleural effusion. Mild left pleural effusion with associated atelectasis or infiltrate. Electronically Signed   By: Marijo Conception M.D.   On: 05/02/2020 19:26   CT Angio Chest PE W and/or Wo Contrast  Result Date: 05/02/2020 CLINICAL DATA:  Shortness of breath, hypotension, tachycardia, hypoxia EXAM: CT ANGIOGRAPHY CHEST WITH CONTRAST TECHNIQUE: Multidetector CT imaging of the chest was performed using the standard protocol during bolus administration of intravenous contrast. Multiplanar CT image reconstructions and MIPs were obtained to evaluate the vascular anatomy. CONTRAST:  23mL OMNIPAQUE IOHEXOL 350 MG/ML SOLN COMPARISON:  05/02/2020, 05/31/2007 FINDINGS: Cardiovascular: This is a technically adequate evaluation of the pulmonary vasculature. No filling defects or pulmonary emboli. There is a new pericardial effusion measuring up to 1 cm in thickness.  Echocardiography may be useful for further evaluation. The thoracic aorta is normal in caliber, with minimal atherosclerosis. No evidence of dissection. Mediastinum/Nodes: No enlarged mediastinal, hilar,  or axillary lymph nodes. Thyroid gland, trachea, and esophagus demonstrate no significant findings. Lungs/Pleura: There are bilateral pleural effusions, left greater than right. Volume on the left less than 1 L, and volume on the right less than 100 cc. The left-sided effusion may be partially loculated. Extensive background emphysema and scarring. Dependent atelectasis within the lower lobes. No acute airspace disease. No pneumothorax. Upper Abdomen: No acute abnormality. Musculoskeletal: No acute or destructive bony lesions. There is extensive spondylosis throughout the visualized thoracolumbar spine with bridging anterior osteophytes. Reconstructed images demonstrate no additional findings. Review of the MIP images confirms the above findings. IMPRESSION: 1. Small pericardial effusion. Echocardiography may be useful for further evaluation. 2. Bilateral pleural effusions, left greater than right. Left-sided effusion is partially loculated. 3. No evidence of pulmonary embolus. 4. Aortic Atherosclerosis (ICD10-I70.0) and Emphysema (ICD10-J43.9). Electronically Signed   By: Randa Ngo M.D.   On: 05/02/2020 21:41    Procedures Procedures (including critical care time)  Medications Ordered in ED Medications  vancomycin (VANCOCIN) IVPB 1000 mg/200 mL premix (1,000 mg Intravenous New Bag/Given 05/02/20 2118)  sodium chloride 0.9 % bolus 2,000 mL (2,000 mLs Intravenous Bolus from Bag 05/02/20 2114)  piperacillin-tazobactam (ZOSYN) IVPB 3.375 g (3.375 g Intravenous New Bag/Given 05/02/20 2115)  iohexol (OMNIPAQUE) 350 MG/ML injection 75 mL (75 mLs Intravenous Contrast Given 05/02/20 2122)    ED Course  I have reviewed the triage vital signs and the nursing notes.  Pertinent labs & imaging results that were  available during my care of the patient were reviewed by me and considered in my medical decision making (see chart for details).    CRITICAL CARE Performed by: Milton Ferguson Total critical care time35 minutes Critical care time was exclusive of separately billable procedures and treating other patients. Critical care was necessary to treat or prevent imminent or life-threatening deterioration. Critical care was time spent personally by me on the following activities: development of treatment plan with patient and/or surrogate as well as nursing, discussions with consultants, evaluation of patient's response to treatment, examination of patient, obtaining history from patient or surrogate, ordering and performing treatments and interventions, ordering and review of laboratory studies, ordering and review of radiographic studies, pulse oximetry and re-evaluation of patient's condition.  MDM Rules/Calculators/A&P                          Patient with effusions in both lower lobes probable pneumonia elevated white count.  He will be treated for pneumonia admitted to medicine       This patient presents to the ED for concern of shortness of breath, this involves an extensive number of treatment options, and is a complaint that carries with it a high risk of complications and morbidity.  The differential diagnosis includes pneumonia PE   Lab Tests:   I Ordered, reviewed, and interpreted labs, which included CBC and chemistries which showed elevated white count  Medicines ordered:   I ordered medication antibiotics for pneumonia  Imaging Studies ordered:   I ordered imaging studies which included chest x-ray and CT angio and  I independently visualized and interpreted imaging which showed no PE but pleural effusions possible pneumonia  Additional history obtained:   Additional history obtained from records  Previous records obtained and reviewed.  Consultations  Obtained:   I consulted hospitalist and discussed lab and imaging findings  Reevaluation:  After the interventions stated above, I reevaluated the patient and found no change  Critical Interventions:  Final Clinical Impression(s) / ED Diagnoses Final diagnoses:  Community acquired pneumonia, unspecified laterality    Rx / DC Orders ED Discharge Orders    None       Milton Ferguson, MD 05/02/20 2219    Milton Ferguson, MD 05/14/20 1209

## 2020-05-02 NOTE — Patient Instructions (Signed)
Need to go to emergency room.

## 2020-05-03 ENCOUNTER — Inpatient Hospital Stay (HOSPITAL_COMMUNITY): Payer: Medicare Other

## 2020-05-03 DIAGNOSIS — I313 Pericardial effusion (noninflammatory): Secondary | ICD-10-CM

## 2020-05-03 DIAGNOSIS — R071 Chest pain on breathing: Secondary | ICD-10-CM

## 2020-05-03 DIAGNOSIS — I2729 Other secondary pulmonary hypertension: Secondary | ICD-10-CM

## 2020-05-03 DIAGNOSIS — J431 Panlobular emphysema: Secondary | ICD-10-CM

## 2020-05-03 LAB — GLUCOSE, PLEURAL OR PERITONEAL FLUID: Glucose, Fluid: 102 mg/dL

## 2020-05-03 LAB — BODY FLUID CELL COUNT WITH DIFFERENTIAL
Eos, Fluid: 5 %
Lymphs, Fluid: 50 %
Monocyte-Macrophage-Serous Fluid: 34 % — ABNORMAL LOW (ref 50–90)
Neutrophil Count, Fluid: 11 % (ref 0–25)
Total Nucleated Cell Count, Fluid: 1608 cu mm — ABNORMAL HIGH (ref 0–1000)

## 2020-05-03 LAB — BASIC METABOLIC PANEL
Anion gap: 13 (ref 5–15)
BUN: 21 mg/dL (ref 8–23)
CO2: 23 mmol/L (ref 22–32)
Calcium: 7.5 mg/dL — ABNORMAL LOW (ref 8.9–10.3)
Chloride: 104 mmol/L (ref 98–111)
Creatinine, Ser: 1.21 mg/dL (ref 0.61–1.24)
GFR calc Af Amer: >60 mL/min (ref >60)
GFR calc non Af Amer: 54 mL/min — ABNORMAL LOW (ref >60)
Glucose, Bld: 114 mg/dL — ABNORMAL HIGH (ref 70–99)
Potassium: 3.6 mmol/L (ref 3.5–5.1)
Sodium: 140 mmol/L (ref 135–145)

## 2020-05-03 LAB — CBC
HCT: 33.1 % — ABNORMAL LOW (ref 39.0–52.0)
Hemoglobin: 10.9 g/dL — ABNORMAL LOW (ref 13.0–17.0)
MCH: 29 pg (ref 26.0–34.0)
MCHC: 32.9 g/dL (ref 30.0–36.0)
MCV: 88 fL (ref 80.0–100.0)
Platelets: 332 10*3/uL (ref 150–400)
RBC: 3.76 MIL/uL — ABNORMAL LOW (ref 4.22–5.81)
RDW: 13.7 % (ref 11.5–15.5)
WBC: 12.4 10*3/uL — ABNORMAL HIGH (ref 4.0–10.5)
nRBC: 0 % (ref 0.0–0.2)

## 2020-05-03 LAB — HEPATIC FUNCTION PANEL
ALT: 42 U/L (ref 0–44)
AST: 26 U/L (ref 15–41)
Albumin: 2.3 g/dL — ABNORMAL LOW (ref 3.5–5.0)
Alkaline Phosphatase: 69 U/L (ref 38–126)
Bilirubin, Direct: 0.2 mg/dL (ref 0.0–0.2)
Indirect Bilirubin: 0.5 mg/dL (ref 0.3–0.9)
Total Bilirubin: 0.7 mg/dL (ref 0.3–1.2)
Total Protein: 5.1 g/dL — ABNORMAL LOW (ref 6.5–8.1)

## 2020-05-03 LAB — C-REACTIVE PROTEIN: CRP: 18.6 mg/dL — ABNORMAL HIGH (ref <1.0)

## 2020-05-03 LAB — SEDIMENTATION RATE: Sed Rate: 52 mm/hr — ABNORMAL HIGH (ref 0–16)

## 2020-05-03 LAB — GLUCOSE, CAPILLARY
Glucose-Capillary: 106 mg/dL — ABNORMAL HIGH (ref 70–99)
Glucose-Capillary: 92 mg/dL (ref 70–99)
Glucose-Capillary: 93 mg/dL (ref 70–99)
Glucose-Capillary: 105 mg/dL — ABNORMAL HIGH (ref 70–99)

## 2020-05-03 LAB — PROTEIN, PLEURAL OR PERITONEAL FLUID: Total protein, fluid: 3.6 g/dL

## 2020-05-03 LAB — LACTATE DEHYDROGENASE: LDH: 99 U/L (ref 98–192)

## 2020-05-03 LAB — ECHOCARDIOGRAM COMPLETE
Height: 72 in
Weight: 1908.8 oz

## 2020-05-03 LAB — LACTATE DEHYDROGENASE, PLEURAL OR PERITONEAL FLUID: LD, Fluid: 124 U/L — ABNORMAL HIGH (ref 3–23)

## 2020-05-03 LAB — SARS CORONAVIRUS 2 BY RT PCR (HOSPITAL ORDER, PERFORMED IN ~~LOC~~ HOSPITAL LAB): SARS Coronavirus 2: NEGATIVE

## 2020-05-03 LAB — PROCALCITONIN
Procalcitonin: <0.1 ng/mL
Procalcitonin: <0.1 ng/mL

## 2020-05-03 MED ORDER — LIDOCAINE HCL 1 % IJ SOLN
INTRAMUSCULAR | Status: AC
  Filled 2020-05-03: qty 20

## 2020-05-03 MED ORDER — DARIFENACIN HYDROBROMIDE ER 15 MG PO TB24
15.0000 mg | ORAL_TABLET | Freq: Every day | ORAL | Status: DC
  Administered 2020-05-03 – 2020-05-04 (×2): 15 mg via ORAL
  Filled 2020-05-03 (×2): qty 1

## 2020-05-03 MED ORDER — CEFDINIR 300 MG PO CAPS
300.0000 mg | ORAL_CAPSULE | Freq: Two times a day (BID) | ORAL | Status: DC
  Administered 2020-05-03 – 2020-05-04 (×2): 300 mg via ORAL
  Filled 2020-05-03 (×2): qty 1

## 2020-05-03 MED ORDER — AZITHROMYCIN 250 MG PO TABS
250.0000 mg | ORAL_TABLET | Freq: Every day | ORAL | Status: DC
  Administered 2020-05-04: 250 mg via ORAL
  Filled 2020-05-03: qty 1

## 2020-05-03 MED ORDER — CHLORDIAZEPOXIDE HCL 5 MG PO CAPS
10.0000 mg | ORAL_CAPSULE | Freq: Every day | ORAL | Status: DC
  Administered 2020-05-03 – 2020-05-04 (×2): 10 mg via ORAL
  Filled 2020-05-03 (×2): qty 2

## 2020-05-03 MED ORDER — SENNOSIDES-DOCUSATE SODIUM 8.6-50 MG PO TABS: 1.0000 | ORAL_TABLET | Freq: Every evening | ORAL | Status: DC | PRN

## 2020-05-03 MED ORDER — AZITHROMYCIN 250 MG PO TABS
500.0000 mg | ORAL_TABLET | Freq: Every day | ORAL | Status: AC
  Administered 2020-05-03: 500 mg via ORAL
  Filled 2020-05-03: qty 2

## 2020-05-03 MED ORDER — SODIUM CHLORIDE 0.9% FLUSH
3.0000 mL | Freq: Two times a day (BID) | INTRAVENOUS | Status: DC
  Administered 2020-05-03 (×3): 3 mL via INTRAVENOUS

## 2020-05-03 MED ORDER — TAMSULOSIN HCL 0.4 MG PO CAPS
0.4000 mg | ORAL_CAPSULE | Freq: Every day | ORAL | Status: DC
  Administered 2020-05-03: 0.4 mg via ORAL
  Filled 2020-05-03: qty 1

## 2020-05-03 MED ORDER — SODIUM CHLORIDE 0.9 % IV SOLN: INTRAVENOUS | Status: AC

## 2020-05-03 MED ORDER — PRAVASTATIN SODIUM 40 MG PO TABS
40.0000 mg | ORAL_TABLET | Freq: Every day | ORAL | Status: DC
  Administered 2020-05-03 – 2020-05-04 (×2): 40 mg via ORAL
  Filled 2020-05-03 (×2): qty 1

## 2020-05-03 NOTE — Procedures (Signed)
PROCEDURE SUMMARY:  Successful image-guided left thoracentesis. Yielded 1.05 liters of dark red fluid. Patient tolerated procedure well. No immediate complications. EBL = 0 mL.  Specimen was sent for labs. CXR ordered.  Please see imaging section of Epic for full dictation.   Claris Pong Manfred Laspina PA-C 05/03/2020 10:38 AM

## 2020-05-03 NOTE — Progress Notes (Addendum)
PROGRESS NOTE  Jesse [name]  DOB: 1931/11/26  PCP: Virgie Dad, MD YSA:[id number]  DOA: 05/02/2020  LOS: 1 day   Chief Complaint  Patient presents with  . Shortness of Breath  . Fatigue   Brief narrative: Patient is a 86 y.o. male with PMH significant for anxiety, BPH, prostate cancer status post radiation, bladder cancer status post TURBT, and hyperlipidemia. Patient presented to the ED on 6/30 from home for evaluation of chest pain and shortness of breath.  Patient reports that he was hospitalized in January 2021 with COVID-19 infection, reports that it was not until late April or early May that he felt completely recovered from that illness, and he continued to do well until just over 2 weeks ago when he developed left-sided chest pain, shortness of breath, and cough.  Patient describes pain at the lower left lateral chest wall that radiates up through his chest and back, becomes severe with deep breath or cough, but unchanged whether he is leaning forward or laying back.  This has been associated with shortness of breath and a cough.  He has not noticed any fevers or chills.  He denies any leg swelling or tenderness.   He was evaluated for this by his PCP 2 weeks prior to presentation with reassuring EKG and chest x-ray per report.his symptoms however continued to worsen and he returned to his PCP.  He was found to be hypoxic, tachycardic, systolic blood pressure in the 80s, and was sent to the ED.  He denies abdominal pain, vomiting, or diarrhea, but has had very poor appetite for the past 2 weeks. Patient reports that he quit smoking in 1986 after 35 years of 1 pack/day.  In the ED, patient was afebrile, O2 sat in high 80s, tachypneic, tachycardic in the 110s, and with blood pressure 88/56.   EKG features sinus tachycardia with rate 117  chest x-ray with minimal right and mild left pleural effusion.  CTA chest was negative for PE but concerning for small right pleural  effusion and larger left pleural effusion that might be partially loculated.   Chemistry panel notable for creatinine 1.37 and CBC with leukocytosis to 14,200. High-sensitivity troponin was normal x2 and BNP was normal.   Patient was given 2 L of saline, blood cultures were collected, and he was started on IV vancomycin and IV Zosyn. Patient was admitted to hospitalist service for further evaluation management  Subjective: Patient was seen and examined this morning.  Pleasant elderly Caucasian male.  Lying down in bed.  Not in distress.  He underwent thoracentesis earlier.  Per report, he had 1 L of 'red color' pleural fluid drained.  Fluid analysis including cytology was sent.  Assessment/Plan: Serosanguineous pleural fluid Rule out malignancy -Patient presented with progressive respiratory symptoms including chest pain, shortness of breath and cough.  Has significant history of smoking -CT chest did not show any lung mass but large left pleural effusion. -Underwent thoracentesis today with removal of 1 L of ' red-colored pleural fluid'.  High suspicion of malignancy. -Pending pleural fluid analysis including cytology. -Procalcitonin level low.  WBC only mildly elevated. -Currently on empiric antibiotic which I will continue for now.  Hypotension  -Initially in the ED, SBP was in the 80s.  It improved with IV hydration. -Post thoracentesis patient blood pressure is running low again in 80s. -He is not on any blood pressure medications at home except for Flomax for bladder/prostate.  I would hold Flomax for now.  I  will start him on normal saline at 100 mill per hour.  Renal insufficiency  -Scr is 1.37 on admission, up from 1.18 on 6/10 and 0.8-0.9 prior to that  -Likely related to low blood pressure and poor oral intake.  Anxiety  - Stable, continue benzodiazepine    Ruled out pericardial effusion  -Initial CT scan noted pericardial effusion.  Echocardiogram obtained today 7/1 showed  EF of 60 to 65%, large epicardial adipose layer with a small adjacent pericardial effusion.  No tamponade physiology.    Mobility: Encourage ambulation  Nutritional status: Body mass index is 16.18 kg/m.     Diet Order            Diet Heart Room service appropriate? Yes; Fluid consistency: Thin  Diet effective now                 Code Status:   Code Status: DNR  DVT prophylaxis: SCDs Start: 05/03/20 0048   Antimicrobials:  IV Rocephin and IV Flagyl Fluid: No IV fluid  Consultants: none Family Communication:  None at bedside Status is: Inpatient  Remains inpatient appropriate because:Ongoing diagnostic testing needed not appropriate for outpatient work up and IV treatments appropriate due to intensity of illness or inability to take PO   Dispo: The patient is from: Home              Anticipated d/c is to: Home              Anticipated d/c date is: 2 days              Patient currently is not medically stable to d/c.       Infusions:  . sodium chloride    . cefTRIAXone (ROCEPHIN)  IV 2 g (05/03/20 0314)  . metronidazole 500 mg (05/03/20 1348)    Scheduled Meds: . chlordiazePOXIDE  10 mg Oral Daily  . darifenacin  15 mg Oral Daily  . lidocaine      . pravastatin  40 mg Oral Daily  . sodium chloride flush  3 mL Intravenous Q12H    Antimicrobials: Anti-infectives (From admission, onward)   Start     Dose/Rate Route Frequency Ordered Stop   05/02/20 2345  cefTRIAXone (ROCEPHIN) 2 g in sodium chloride 0.9 % 100 mL IVPB     Discontinue     2 g 200 mL/hr over 30 Minutes Intravenous Daily at bedtime 05/02/20 2329 05/07/20 2159   05/02/20 2345  metroNIDAZOLE (FLAGYL) IVPB 500 mg     Discontinue     500 mg 100 mL/hr over 60 Minutes Intravenous Every 8 hours 05/02/20 2329     05/02/20 2045  vancomycin (VANCOCIN) IVPB 1000 mg/200 mL premix        1,000 mg 200 mL/hr over 60 Minutes Intravenous  Once 05/02/20 2037 05/02/20 2222   05/02/20 2045   piperacillin-tazobactam (ZOSYN) IVPB 3.375 g        3.375 g 100 mL/hr over 30 Minutes Intravenous  Once 05/02/20 2037 05/02/20 2222      PRN meds: acetaminophen **OR** acetaminophen, HYDROcodone-acetaminophen, ondansetron **OR** ondansetron (ZOFRAN) IV, senna-docusate   Objective: Vitals:   05/03/20 1117 05/03/20 1513  BP: (!) 84/63 (!) 94/56  Pulse: 88 99  Resp:    Temp: 97.8 F (36.6 C) 97.8 F (36.6 C)  SpO2: 98% 95%    Intake/Output Summary (Last 24 hours) at 05/03/2020 1633 Last data filed at 05/03/2020 0004 Gross per 24 hour  Intake 2000 ml  Output --  Net 2000 ml   Filed Weights   05/02/20 1825 05/03/20 0528  Weight: 55 kg 54.1 kg   Weight change:  Body mass index is 16.18 kg/m.   Physical Exam: General exam: Appears calm and comfortable.  Not in physical distress Skin: No rashes, lesions or ulcers. HEENT: Atraumatic, normocephalic, supple neck, no obvious bleeding Lungs: Clear to auscultation bilaterally post thoracentesis CVS: Regular rate and rhythm, no murmur GI/Abd soft, nontender, nondistended, bowel sound present CNS: Alert, awake, oriented x3 Psychiatry: Mood appropriate Extremities: No pedal edema, no calf tenderness  Data Review: I have personally reviewed the laboratory data and studies available.  Recent Labs  Lab 05/02/20 1858 05/03/20 0337  WBC 14.2* 12.4*  HGB 13.3 10.9*  HCT 41.0 33.1*  MCV 89.1 88.0  PLT 393 332   Recent Labs  Lab 05/02/20 1858 05/03/20 0337  NA 141 140  K 4.0 3.6  CL 103 104  CO2 26 23  GLUCOSE 124* 114*  BUN 25* 21  CREATININE 1.37* 1.21  CALCIUM 8.3* 7.5*   Signed, Terrilee Croak, MD [facility] Pager: [phone] (Secure Chat preferred). 05/03/2020

## 2020-05-03 NOTE — Consult Note (Signed)
NAME:  CHAEL URENDA, MRN:  [id number], DOB:  12-21-1931, LOS: 1 ADMISSION DATE:  05/02/2020, CONSULTATION DATE:  05/03/2020 REFERRING MD:  Dr Marlowe Aschoff, CHIEF COMPLAINT:  Pleural effusion   Brief History   Asked to see patient regarding pleural effusion History of present illness   Came into the hospital worsening chest pain chest discomfort, shortness of breath Has had chest discomfort with deep breathing for about 4 weeks Evaluated in the outpatient setting Has not been coughing, not bringing up any secretions Was recently evaluated with EKG, chest x-ray in the outpatient setting  Initial evaluation did reveal bilateral pleural effusion with loculated effusion on the left, pericardial effusion he was stopped ~1 L of fluid  Thoracentesis for 1 L of fluid  Past Medical History   Past Medical History:  Diagnosis Date  . Anxiety   . Asthma    as child  . Bladder cancer [facility]) urologist-  dr Elliot Gault [facility] in [place], [place])   dx 01/ 2016--- post TURBT and post Bladder bx 02-10-2017  . Bladder tumor   . BPH (benign prostatic hyperplasia)   . Frequency of urination   . History of asthma    childhood  . History of external beam radiation therapy    2002  prostate/ pelvis and boost w/ radioactive prostate seed implants   . History of hypercalcemia    s/p  parathyroidectomy 2003  . History of prostate cancer current urologist-  dr Elliot Gault [facility] in [place], [place])-- per lov note (care everywhere) last PSA undetectable   dx 2002--  urologist-- dr dahlstedt/ oncologist dr Valere Dross---  Gleason 7 out of 10, PSA 4.9---post Radioactive Prostate Seed Implant and External Beam Radiation therapy  . History of skin cancer   . Hyperlipidemia   . Multiple lung nodules    since 2006--- followed by pcp --- dr Maudie Mercury  . Renal cyst, left   . Wears glasses   . Wears hearing aid in both ears      Significant Hospital Events   Thoracentesis 05/03/2020-1.05 L red  fluid  Consults:  pulmonary  Procedures:  Thoracentesis 05/03/2020  Significant Diagnostic Tests:  CT scan of the chest with bilateral pleural effusions, pericardial effusion CT scan of the chest IMPRESSION: 1. Small pericardial effusion. Echocardiography may be useful for further evaluation. 2. Bilateral pleural effusions, left greater than right. Left-sided effusion is partially loculated. 3. No evidence of pulmonary embolus. 4. Aortic Atherosclerosis (ICD10-I70.0) and Emphysema (ICD10-J43.9). Micro Data:  Blood culture 6/30 Pleural fluid culture 7/1  Antimicrobials:  Cefepime Metronidazole Zosyn Vancomycin  Interim history/subjective:  Feels better post thoracentesis  Objective   Blood pressure (!) 94/56, pulse 99, temperature 97.8 F (36.6 C), temperature source Oral, resp. rate 18, height 6' (1.829 m), weight 54.1 kg, SpO2 95 %.        Intake/Output Summary (Last 24 hours) at 05/03/2020 1838 Last data filed at 05/03/2020 1816 Gross per 24 hour  Intake 2400 ml  Output --  Net 2400 ml   Filed Weights   05/02/20 1825 05/03/20 0528  Weight: 55 kg 54.1 kg    Examination: General: Elderly gentleman, does not appear to be in distress HENT: Moist oral mucosa Lungs: Decreased air entry at the bases but clear mostly, no rales Cardiovascular: S1-S2 appreciated Abdomen: Bowel sounds appreciated Extremities: No clubbing, no edema Neuro:  GU:   Chest x-ray CT scan of the chest was reviewed with the patient  Resolved Hospital Problem list  Assessment & Plan:  Pleural effusion -Bilateral pleural effusion -Post thoracentesis for 1 L of fluid -Lymphocyte predominant, exudative effusion -Likely related to his recent pneumonia, still has extensive infiltrative process -With a recent chest x-ray showing some mild change at the base of the left lung, may have a resolving pneumonia -He still has a leukocytosis -Cover with antibiotics  Pleural fluid cytology  should be followed because of his history of prostate cancer in the past  Chronic obstructive pulmonary disease -Did not have his diagnosis in the past -He does have extensive emphysematous changes -He does not appear to be limited with respect to his respiratory status at baseline -May benefit from inhalers which can be addressed as outpatient  Hypotension -Resolved  Acute kidney injury -Likely prerenal -Continue cautious hydration  Pericardial effusion  Mild pulmonary hypertension  Follow pleural fluid cytology Stable to be discharged 05/04/2020 I will switch him to oral antibiotics Omnicef and azithromycin for possible community-acquired pneumonia  Sherrilyn Rist, MD [facility] Pager: [phone]

## 2020-05-03 NOTE — Progress Notes (Signed)
  Echocardiogram 2D Echocardiogram has been performed.  Jesse [name] M 05/03/2020, 9:37 AM

## 2020-05-04 LAB — BASIC METABOLIC PANEL
Anion gap: 8 (ref 5–15)
BUN: 19 mg/dL (ref 8–23)
CO2: 23 mmol/L (ref 22–32)
Calcium: 7.6 mg/dL — ABNORMAL LOW (ref 8.9–10.3)
Chloride: 111 mmol/L (ref 98–111)
Creatinine, Ser: 1.13 mg/dL (ref 0.61–1.24)
GFR calc Af Amer: >60 mL/min (ref >60)
GFR calc non Af Amer: 59 mL/min — ABNORMAL LOW (ref >60)
Glucose, Bld: 96 mg/dL (ref 70–99)
Potassium: 3.4 mmol/L — ABNORMAL LOW (ref 3.5–5.1)
Sodium: 142 mmol/L (ref 135–145)

## 2020-05-04 LAB — CBC
HCT: 32.8 % — ABNORMAL LOW (ref 39.0–52.0)
Hemoglobin: 10.7 g/dL — ABNORMAL LOW (ref 13.0–17.0)
MCH: 28.8 pg (ref 26.0–34.0)
MCHC: 32.6 g/dL (ref 30.0–36.0)
MCV: 88.2 fL (ref 80.0–100.0)
Platelets: 309 10*3/uL (ref 150–400)
RBC: 3.72 MIL/uL — ABNORMAL LOW (ref 4.22–5.81)
RDW: 13.8 % (ref 11.5–15.5)
WBC: 7.6 10*3/uL (ref 4.0–10.5)
nRBC: 0 % (ref 0.0–0.2)

## 2020-05-04 LAB — PROCALCITONIN: Procalcitonin: 0.1 ng/mL

## 2020-05-04 MED ORDER — SACCHAROMYCES BOULARDII 250 MG PO CAPS: 250.0000 mg | ORAL_CAPSULE | Freq: Two times a day (BID) | ORAL | 0 refills | Status: AC

## 2020-05-04 MED ORDER — AZITHROMYCIN 250 MG PO TABS: 250.0000 mg | ORAL_TABLET | Freq: Every day | ORAL | 0 refills | Status: AC

## 2020-05-04 MED ORDER — CEFDINIR 300 MG PO CAPS: 300.0000 mg | ORAL_CAPSULE | Freq: Two times a day (BID) | ORAL | 0 refills | Status: AC

## 2020-05-04 NOTE — Discharge Summary (Signed)
Physician Discharge Summary  Jesse [name] XNT:[id number] DOB: [birth year] DOA: 05/02/2020  PCP: Virgie Dad, MD  Admit date: 05/02/2020 Discharge date: 05/04/2020  Admitted From: Home Discharge disposition: Home   Code Status: DNR  Diet Recommendation: Regular diet   Recommendations for Outpatient Follow-Up:   1. Follow-up with PCP as an outpatient  Discharge Diagnosis:   Principal Problem:   Pneumonia Active Problems:   Hypotension   Chest pain   Pleural effusion   Pericardial effusion   Renal insufficiency    History of Present Illness / Brief narrative:  Patientis a 86 y.o.malewith PMH significant foranxiety, BPH, prostate cancer status post radiation, bladder cancer status post TURBT, and hyperlipidemia. Patient presented to the ED on 6/30 from home for evaluation of chest pain and shortness of breath. Patient reports that he was hospitalized in January 2021 with COVID-19 infection, reports that it was not until late April or early May that he felt completely recovered from that illness, and he continued to do well until just over 2 weeks ago when he developed left-sided chest pain, shortness of breath, and cough. Patient describes pain at the lower left lateral chest wall that radiates up through his chest and back, becomes severe with deep breath or cough, but unchanged whether he is leaning forward or laying back. This has been associated with shortness of breath and a cough. He has not noticed any fevers or chills. He denies any leg swelling or tenderness.  He was evaluated for this by his PCP 2 weeks prior to presentation with reassuring EKG and chest x-ray per report.his symptoms however continued to worsen and he returned to his PCP.  He was found to be hypoxic, tachycardic, systolic blood pressure in the 80s, and was sent to the ED. He denies abdominal pain, vomiting, or diarrhea, but has had very poor appetite for the past 2 weeks. Patient reports  that he quit smoking in 1986 after 35 years of 1 pack/day.  In the ED, patient was afebrile, O2 sat in high 80s, tachypneic, tachycardic in the 110s, and with blood pressure 88/56.  EKG features sinus tachycardia with rate 117  chest x-ray with minimal right and mild left pleural effusion.  CTA chest was negative for PE but concerning for small right pleural effusion and larger left pleural effusion that might be partially loculated.  Chemistry panel notable for creatinine 1.37 and CBC with leukocytosis to 14,200. High-sensitivity troponin was normal x2 and BNP was normal.  Patient was given 2 L of saline, blood cultures were collected, and he was started on IV vancomycin and IV Zosyn. Patient was admitted to hospitalist service for further evaluation management  Hospital Course:  Parapneumonic effusion  rule out malignancy -Patient presented with progressive respiratory symptoms including chest pain, shortness of breath and cough.  Has significant history of smoking -CT chest did not show any lung mass but large left pleural effusion. -Underwent thoracentesis on 7/1 with removal of 1 L of ' red-colored pleural fluid'.  Pleural fluid analysis showed cloudy red fluid.  Pending cytology report. -Pulmonary consultation obtained.  Likely parapneumonic effusion secondary to resolving pneumonia.  5 more days of empiric antibiotic post discharge.  Hypotension -Initially in the ED, SBP was in the 80s.  It improved with IV hydration.  Renal insufficiency -Scr is 1.37 on admission, up from 1.18 on 6/10 and 0.8-0.9 prior to that -Likely related to low blood pressure and poor oral intake.  Anxiety -Stable, continue benzodiazepine  Ruled  out pericardial effusion -Initial CT scan noted pericardial effusion.  Echocardiogram obtained on 7/1 showed EF of 60 to 65%, large epicardial adipose layer with a small adjacent pericardial effusion.  No tamponade physiology.    Mobility:  Encourage ambulation  Nutritional status: Body mass index is 16.18 kg/m. Code Status:  Code Status: DNR  Antimicrobials: [place]  Consultants: none Family Communication: None at bedside  Wound care: Pressure Injury 11/21/19 Buttocks (Active)  11/21/19 0030  Location: Buttocks  Location Orientation:   Staging:   Wound Description (Comments):   Present on Admission:     Subjective:  Seen and examined this morning.  Not in distress.  No new symptoms.  Chest pain resolved.  Discharge Exam:   Vitals:   05/03/20 1117 05/03/20 1513 05/03/20 2156 05/04/20 0706  BP: (!) 84/63 (!) 94/56 106/61 111/69  Pulse: 88 99 87 79  Resp:   20 19  Temp: 97.8 F (36.6 C) 97.8 F (36.6 C) 97.8 F (36.6 C) 97.6 F (36.4 C)  TempSrc: Oral Oral Oral Oral  SpO2: 98% 95% 94% 94%  Weight:      Height:        Body mass index is 16.18 kg/m.  General exam: Appears calm and comfortable.  Skin: No rashes, lesions or ulcers. HEENT: Atraumatic, normocephalic, supple neck, no obvious bleeding Lungs: Clear to auscultation bilaterally CVS: Regular rate and rhythm, no murmur GI/Abd soft, nontender, nondistended, bowel sound present CNS: Alert, awake, oriented x3 Psychiatry: Mood appropriate Extremities: No pedal edema, no calf tenderness  Discharge Instructions:   Discharge Instructions    Diet general   Complete by: As directed    Increase activity slowly   Complete by: As directed       Follow-up Information    Virgie Dad, MD Follow up.   Specialty: Internal Medicine Contact information: [place] [address] [phone]              Allergies as of 05/04/2020      Reactions   Adhesive [tape] Other (See Comments)   Tears skin off PAPER TAPE IS OKAY      Medication List    TAKE these medications   acetaminophen 325 MG tablet Commonly known as: TYLENOL Take 325 mg by mouth every 6 (six) hours as needed (for pain).   azithromycin 250 MG  tablet Commonly known as: ZITHROMAX Take 1 tablet (250 mg total) by mouth daily for 3 days.   cefdinir 300 MG capsule Commonly known as: OMNICEF Take 1 capsule (300 mg total) by mouth every 12 (twelve) hours for 7 days.   chlordiazePOXIDE 10 MG capsule Commonly known as: LIBRIUM Take 1 capsule (10 mg total) by mouth daily. In the morning   pravastatin 40 MG tablet Commonly known as: PRAVACHOL Take 40 mg by mouth daily. In the morning.   PRESERVISION AREDS 2 PO Take 1 tablet by mouth in the morning and at bedtime.   saccharomyces boulardii 250 MG capsule Commonly known as: FLORASTOR Take 1 capsule (250 mg total) by mouth 2 (two) times daily for 10 days.   solifenacin 10 MG tablet Commonly known as: VESICARE Take 10 mg by mouth daily. In the morning.   tamsulosin 0.4 MG Caps capsule Commonly known as: FLOMAX Take 0.4 mg by mouth daily.   vitamin B-12 1000 MCG tablet Commonly known as: CYANOCOBALAMIN Take 1,000 mcg by mouth daily.   Vitamin D3 25 MCG (1000 UT) Caps Take 1,000 Units by mouth daily.  Time coordinating discharge: 35 minutes  The results of significant diagnostics from this hospitalization (including imaging, microbiology, ancillary and laboratory) are listed below for reference.    Procedures and Diagnostic Studies:   DG Chest 1 View  Result Date: 05/03/2020 CLINICAL DATA:  Status post left thoracentesis. EXAM: CHEST  1 VIEW COMPARISON:  05/02/2020 FINDINGS: Normal heart size. Interval decrease in volume of left pleural effusion status post thoracentesis. No pneumothorax visualized. Small right pleural effusion unchanged. Lungs are hyperinflated with interstitial changes of emphysema. No airspace opacities. IMPRESSION: 1. Decrease in volume of left pleural effusion status post thoracentesis. 2. No pneumothorax. Electronically Signed   By: Kerby Moors M.D.   On: 05/03/2020 11:10   DG Chest 2 View  Result Date: 05/02/2020 CLINICAL DATA:  Shortness  of breath. EXAM: CHEST - 2 VIEW COMPARISON:  April 11, 2020. FINDINGS: The heart size and mediastinal contours are within normal limits. No pneumothorax is noted. Minimal right pleural effusion is noted. Mild left pleural effusion is noted with associated atelectasis or infiltrate. The visualized skeletal structures are unremarkable. IMPRESSION: Minimal right pleural effusion. Mild left pleural effusion with associated atelectasis or infiltrate. Electronically Signed   By: Marijo Conception M.D.   On: 05/02/2020 19:26   CT Angio Chest PE W and/or Wo Contrast  Result Date: 05/02/2020 CLINICAL DATA:  Shortness of breath, hypotension, tachycardia, hypoxia EXAM: CT ANGIOGRAPHY CHEST WITH CONTRAST TECHNIQUE: Multidetector CT imaging of the chest was performed using the standard protocol during bolus administration of intravenous contrast. Multiplanar CT image reconstructions and MIPs were obtained to evaluate the vascular anatomy. CONTRAST:  53mL OMNIPAQUE IOHEXOL 350 MG/ML SOLN COMPARISON:  05/02/2020, 05/31/2007 FINDINGS: Cardiovascular: This is a technically adequate evaluation of the pulmonary vasculature. No filling defects or pulmonary emboli. There is a new pericardial effusion measuring up to 1 cm in thickness. Echocardiography may be useful for further evaluation. The thoracic aorta is normal in caliber, with minimal atherosclerosis. No evidence of dissection. Mediastinum/Nodes: No enlarged mediastinal, hilar, or axillary lymph nodes. Thyroid gland, trachea, and esophagus demonstrate no significant findings. Lungs/Pleura: There are bilateral pleural effusions, left greater than right. Volume on the left less than 1 L, and volume on the right less than 100 cc. The left-sided effusion may be partially loculated. Extensive background emphysema and scarring. Dependent atelectasis within the lower lobes. No acute airspace disease. No pneumothorax. Upper Abdomen: No acute abnormality. Musculoskeletal: No acute or  destructive bony lesions. There is extensive spondylosis throughout the visualized thoracolumbar spine with bridging anterior osteophytes. Reconstructed images demonstrate no additional findings. Review of the MIP images confirms the above findings. IMPRESSION: 1. Small pericardial effusion. Echocardiography may be useful for further evaluation. 2. Bilateral pleural effusions, left greater than right. Left-sided effusion is partially loculated. 3. No evidence of pulmonary embolus. 4. Aortic Atherosclerosis (ICD10-I70.0) and Emphysema (ICD10-J43.9). Electronically Signed   By: Randa Ngo M.D.   On: 05/02/2020 21:41   ECHOCARDIOGRAM COMPLETE  Result Date: 05/03/2020    ECHOCARDIOGRAM REPORT   Patient Name:   Jesse [name] Date of Exam: 05/03/2020 Medical Rec #:  [id number]              Height:       72.0 in Accession #:    [id number]             Weight:       119.3 lb Date of Birth:  04/02/1932  BSA:          1.711 m Patient Age:    86 years               BP:           95/56 mmHg Patient Gender: M                      HR:           97 bpm. Exam Location:  Inpatient Procedure: 2D Echo Indications:    Pericardial effusion 423.9 / I31.3  History:        Patient has no prior history of Echocardiogram examinations.                 Signs/Symptoms:Shortness of Breath and Hypotension; Risk                 Factors:Dyslipidemia. COVID-19 infection in January 2021.                 Left-sided pleuritic pain. Pneumonia. Past history of bladder                 and prostate cancer.  Sonographer:    Darlina Sicilian RDCS Referring Phys: [id number] [place]  1. Left ventricular ejection fraction, by estimation, is 60 to 65%. The left ventricle has normal function. The left ventricle has no regional wall motion abnormalities. Left ventricular diastolic parameters are indeterminate.  2. Right ventricular systolic function is normal. The right ventricular size is normal. There is mildly elevated  pulmonary artery systolic pressure. The estimated right ventricular systolic pressure is 45.4 mmHg.  3. There is a large epicardial adipose layer with a small adjacent pericardial effusion anterior to the right ventricle. No definite features of tamponade physiology. Full assessment for constrictive physiology not performed.  4. The mitral valve is normal in structure. Trivial mitral valve regurgitation. No evidence of mitral stenosis.  5. Tricuspid valve regurgitation is mild to moderate.  6. The aortic valve is tricuspid. Aortic valve regurgitation is not visualized. No aortic stenosis is present.  7. The inferior vena cava is normal in size with <50% respiratory variability, suggesting right atrial pressure of 8 mmHg. FINDINGS  Left Ventricle: Left ventricular ejection fraction, by estimation, is 60 to 65%. The left ventricle has normal function. The left ventricle has no regional wall motion abnormalities. The left ventricular internal cavity size was normal in size. There is  no left ventricular hypertrophy. Left ventricular diastolic parameters are indeterminate. Right Ventricle: The right ventricular size is normal. No increase in right ventricular wall thickness. Right ventricular systolic function is normal. There is mildly elevated pulmonary artery systolic pressure. The tricuspid regurgitant velocity is 2.66  m/s, and with an assumed right atrial pressure of 10 mmHg, the estimated right ventricular systolic pressure is 09.8 mmHg. Left Atrium: Left atrial size was normal in size. Right Atrium: Right atrial size was normal in size. Pericardium: There is a large epicardial adipose layer with a small adjacent pericardial effusion anterior to the right ventricle. No definite features of tamponade physiology. Full assessment for constrictive physiology not performed. A small pericardial effusion is present. Presence of pericardial fat pad. Mitral Valve: The mitral valve is normal in structure. Normal mobility  of the mitral valve leaflets. Trivial mitral valve regurgitation. No evidence of mitral valve stenosis. Tricuspid Valve: The tricuspid valve is normal in structure. Tricuspid valve regurgitation is mild to moderate. No evidence of tricuspid stenosis. Aortic  Valve: The aortic valve is tricuspid. Aortic valve regurgitation is not visualized. No aortic stenosis is present. Pulmonic Valve: The pulmonic valve was normal in structure. Pulmonic valve regurgitation is mild. No evidence of pulmonic stenosis. Aorta: The aortic root is normal in size and structure. Venous: The inferior vena cava is normal in size with less than 50% respiratory variability, suggesting right atrial pressure of 8 mmHg. IAS/Shunts: No atrial level shunt detected by color flow Doppler. There is a tiny area of color flow that appears most consisent with color artifact.  LEFT VENTRICLE PLAX 2D LVIDd:         3.90 cm  Diastology LVIDs:         2.70 cm  LV e' lateral:   5.77 cm/s LV PW:         1.00 cm  LV E/e' lateral: 8.8 LV IVS:        1.00 cm  LV e' medial:    5.77 cm/s LVOT diam:     2.00 cm  LV E/e' medial:  8.8 LV SV:         31 LV SV Index:   18 LVOT Area:     3.14 cm  LEFT ATRIUM           Index       RIGHT ATRIUM           Index LA diam:      2.50 cm 1.46 cm/m  RA Area:     12.20 cm LA Vol (A4C): 25.8 ml 15.08 ml/m RA Volume:   27.30 ml  15.96 ml/m  AORTIC VALVE LVOT Vmax:   71.70 cm/s LVOT Vmean:  39.700 cm/s LVOT VTI:    0.098 m  AORTA Ao Root diam: 3.00 cm MITRAL VALVE               TRICUSPID VALVE MV Area (PHT): 3.93 cm    TR Peak grad:   28.3 mmHg MV Decel Time: 193 msec    TR Vmax:        266.00 cm/s MV E velocity: 50.60 cm/s MV A velocity: 51.80 cm/s  SHUNTS MV E/A ratio:  0.98        Systemic VTI:  0.10 m                            Systemic Diam: 2.00 cm Cherlynn Kaiser MD Electronically signed by Cherlynn Kaiser MD Signature Date/Time: 05/03/2020/2:02:39 PM    Final    US THORACENTESIS ASP PLEURAL SPACE W/IMG GUIDE  Result  Date: 05/03/2020 INDICATION: Patient with history of dyspnea and pleuritic chest pain x2 weeks, bilateral pleural effusions (L>R), and pericardial effusion. Request is made for diagnostic and therapeutic left thoracentesis. EXAM: ULTRASOUND GUIDED DIAGNOSTIC AND THERAPEUTIC LEFT THORACENTESIS MEDICATIONS: 8 mL 1% lidocaine COMPLICATIONS: None immediate. PROCEDURE: An ultrasound guided thoracentesis was thoroughly discussed with the patient and questions answered. The benefits, risks, alternatives and complications were also discussed. The patient understands and wishes to proceed with the procedure. Written consent was obtained. Ultrasound was performed to localize and mark an adequate pocket of fluid in the left chest. The area was then prepped and draped in the normal sterile fashion. 1% Lidocaine was used for local anesthesia. Under ultrasound guidance a 6 Fr Safe-T-Centesis catheter was introduced. Thoracentesis was performed. The catheter was removed and a dressing applied. FINDINGS: A total of approximately 1.05 L of dark red fluid was removed. Samples were sent  to the laboratory as requested by the clinical team. IMPRESSION: Successful ultrasound guided left thoracentesis yielding 1.05 L of pleural fluid. Read by: Earley Abide, PA-C Electronically Signed   By: Jacqulynn Cadet M.D.   On: 05/03/2020 11:16     Labs:   Basic Metabolic Panel: Recent Labs  Lab 05/02/20 1858 05/02/20 1858 05/03/20 0337 05/04/20 0340  NA 141  --  140 142  K 4.0   < > 3.6 3.4*  CL 103  --  104 111  CO2 26  --  23 23  GLUCOSE 124*  --  114* 96  BUN 25*  --  21 19  CREATININE 1.37*  --  1.21 1.13  CALCIUM 8.3*  --  7.5* 7.6*   < > = values in this interval not displayed.   GFR Estimated Creatinine Clearance: 35.9 mL/min (by C-G formula based on SCr of 1.13 mg/dL). Liver Function Tests: Recent Labs  Lab 05/03/20 0337  AST 26  ALT 42  ALKPHOS 69  BILITOT 0.7  PROT 5.1*  ALBUMIN 2.3*   No results for  input(s): LIPASE, AMYLASE in the last 168 hours. No results for input(s): AMMONIA in the last 168 hours. Coagulation profile No results for input(s): INR, PROTIME in the last 168 hours.  CBC: Recent Labs  Lab 05/02/20 1858 05/03/20 0337 05/04/20 0340  WBC 14.2* 12.4* 7.6  HGB 13.3 10.9* 10.7*  HCT 41.0 33.1* 32.8*  MCV 89.1 88.0 88.2  PLT 393 332 309   Cardiac Enzymes: No results for input(s): CKTOTAL, CKMB, CKMBINDEX, TROPONINI in the last 168 hours. BNP: Invalid input(s): POCBNP CBG: Recent Labs  Lab 05/03/20 0737 05/03/20 1151 05/03/20 1632 05/03/20 2153  GLUCAP 106* 92 93 105*   D-Dimer No results for input(s): DDIMER in the last 72 hours. Hgb A1c No results for input(s): HGBA1C in the last 72 hours. Lipid Profile No results for input(s): CHOL, HDL, LDLCALC, TRIG, CHOLHDL, LDLDIRECT in the last 72 hours. Thyroid function studies No results for input(s): TSH, T4TOTAL, T3FREE, THYROIDAB in the last 72 hours.  Invalid input(s): FREET3 Anemia work up No results for input(s): VITAMINB12, FOLATE, FERRITIN, TIBC, IRON, RETICCTPCT in the last 72 hours. Microbiology Recent Results (from the past 240 hour(s))  Blood culture (routine x 2)     Status: None (Preliminary result)   Collection Time: 05/02/20  8:55 PM   Specimen: BLOOD  Result Value Ref Range Status   Specimen Description   Final    BLOOD LEFT ANTECUBITAL Performed at [facility] [address], [place], [place] [address]    Special Requests   Final    BOTTLES DRAWN AEROBIC AND ANAEROBIC Blood Culture adequate volume Performed at [facility] [address], [place], [place] [address]    Culture   Final    NO GROWTH 1 DAY Performed at [facility], [place] [address] [place], [place] [address]    Report Status PENDING  Incomplete  Blood culture (routine x 2)     Status: None (Preliminary result)   Collection Time: 05/02/20  9:05 PM   Specimen: BLOOD    Result Value Ref Range Status   Specimen Description   Final    BLOOD RIGHT ANTECUBITAL Performed at [facility] [address] [place], [place] [address]    Special Requests   Final    BOTTLES DRAWN AEROBIC AND ANAEROBIC Blood Culture adequate volume Performed at [facility] [address]  [place], [place] [address]    Culture   Final    NO GROWTH 1 DAY Performed at [facility], [place] [address] [place], [place] [address]    Report Status PENDING  Incomplete  SARS Coronavirus 2 by RT PCR (hospital order, performed in [facility] hospital lab) Nasopharyngeal Nasopharyngeal Swab     Status: None   Collection Time: 05/03/20 12:05 AM   Specimen: Nasopharyngeal Swab  Result Value Ref Range Status   SARS Coronavirus 2 NEGATIVE NEGATIVE Final    Comment: (NOTE) SARS-CoV-2 target nucleic acids are NOT DETECTED.  The SARS-CoV-2 RNA is generally detectable in upper and lower respiratory specimens during the acute phase of infection. The lowest concentration of SARS-CoV-2 viral copies this assay can detect is 250 copies / mL. A negative result does not preclude SARS-CoV-2 infection and should not be used as the sole basis for treatment or other patient management decisions.  A negative result may occur with improper specimen collection / handling, submission of specimen other than nasopharyngeal swab, presence of viral mutation(s) within the areas targeted by this assay, and inadequate number of viral copies (<250 copies / mL). A negative result must be combined with clinical observations, patient history, and epidemiological information.  Fact Sheet for Patients:   StrictlyIdeas.no  Fact Sheet for Healthcare Providers: BankingDealers.co.za  This test is not yet approved or  cleared by the [place] FDA and has been authorized for detection and/or diagnosis of SARS-CoV-2 by FDA under  an Emergency Use Authorization (EUA).  This EUA will remain in effect (meaning this test can be used) for the duration of the COVID-19 declaration under Section 564(b)(1) of the Act, 21 U.S.C. section 360bbb-3(b)(1), unless the authorization is terminated or revoked sooner.  Performed at [facility], [place] [address], [place], [place] [address]   Body fluid culture     Status: None (Preliminary result)   Collection Time: 05/03/20 11:09 AM   Specimen: PATH Cytology Pleural fluid  Result Value Ref Range Status   Specimen Description   Final    PLEURAL Performed at [facility] [address], [place], [place] [address]    Special Requests   Final    NONE Performed at [facility], [place] [address], [place], [place] [address]    Gram Stain   Final    FEW WBC PRESENT, PREDOMINANTLY MONONUCLEAR NO ORGANISMS SEEN    Culture   Final    NO GROWTH < 24 HOURS Performed at [facility] [address] [place], [place] [address]    Report Status PENDING  Incomplete    Please note: You were cared for by a hospitalist during your hospital stay. Once you are discharged, your primary care physician will handle any further medical issues. Please note that NO REFILLS for any discharge medications will be authorized once you are discharged, as it is imperative that you return to your primary care physician (or establish a relationship with a primary care physician if you do not have one) for your post hospital discharge needs so that they can reassess your need for medications and monitor your lab values.  Signed: Marlowe Aschoff Quintavious Rinck  [facility] 05/04/2020, 1:56 PM

## 2020-05-04 NOTE — Care Management Important Message (Signed)
Important Message  Patient Details IM Letter given to Gabriel Earing RN Case Manager to present to the Patient Name: Jesse [name] MRN: [id number] Date of Birth: 1932/06/13   Medicare Important Message Given:  Yes     Kerin Salen 05/04/2020, 10:08 AM

## 2020-05-04 NOTE — Plan of Care (Signed)
  Problem: Education: Goal: Knowledge of General Education information will improve Description Including pain rating scale, medication(s)/side effects and non-pharmacologic comfort measures Outcome: Progressing   Problem: Nutrition: Goal: Adequate nutrition will be maintained Outcome: Progressing   Problem: Pain Managment: Goal: General experience of comfort will improve Outcome: Progressing   

## 2020-05-04 NOTE — Progress Notes (Signed)
AVS given to patient and explained at the bedside. Medications and follow up appointments have been explained with pt verbalizing understanding.  

## 2020-05-05 ENCOUNTER — Other Ambulatory Visit: Payer: Self-pay | Admitting: Gastroenterology

## 2020-05-06 LAB — BODY FLUID CULTURE: Culture: NO GROWTH

## 2020-05-08 ENCOUNTER — Other Ambulatory Visit: Payer: Medicare Other

## 2020-05-08 LAB — CULTURE, BLOOD (ROUTINE X 2)
Culture: NO GROWTH
Culture: NO GROWTH
Special Requests: ADEQUATE
Special Requests: ADEQUATE

## 2020-05-08 LAB — CYTOLOGY - NON PAP

## 2020-05-10 ENCOUNTER — Other Ambulatory Visit: Payer: Self-pay | Admitting: Internal Medicine

## 2020-05-10 NOTE — Telephone Encounter (Signed)
Patient called and requested refill.  Pended and sent to Dr. Lyndel Safe for approval.

## 2020-05-11 ENCOUNTER — Other Ambulatory Visit: Payer: Self-pay

## 2020-05-11 NOTE — Patient Outreach (Signed)
[facility]) Care Management  05/11/2020  Jesse [name] 1932/04/11 [id number]   Red emmi: Date of red emmi:  05/06/2020 Reason for red emmi:  Read discharge papers---no                                      Knows who to call about changes in condition--no                                       Other questions- yes  Placed call to patient and explained reason for call. Patient reports that he did answer the questions correctly or the machine id not record answers correctly.   Patient reports that he I doing very well. Reports he was very happy with his hospital stay.   Denies any current needs at this time and will close case.  Tomasa Rand, RN, BSN, CEN [facility] [employer] [phone]

## 2020-05-16 ENCOUNTER — Other Ambulatory Visit: Payer: Self-pay

## 2020-05-16 ENCOUNTER — Encounter: Payer: Self-pay | Admitting: Internal Medicine

## 2020-05-16 ENCOUNTER — Non-Acute Institutional Stay: Payer: Medicare Other | Admitting: Internal Medicine

## 2020-05-16 VITALS — BP 92/50 | HR 108 | Ht 72.0 in | Wt 117.8 lb

## 2020-05-16 DIAGNOSIS — R0602 Shortness of breath: Secondary | ICD-10-CM

## 2020-05-16 DIAGNOSIS — J431 Panlobular emphysema: Secondary | ICD-10-CM

## 2020-05-16 DIAGNOSIS — R531 Weakness: Secondary | ICD-10-CM

## 2020-05-16 DIAGNOSIS — R630 Anorexia: Secondary | ICD-10-CM | POA: Diagnosis not present

## 2020-05-16 DIAGNOSIS — R000 Tachycardia, unspecified: Secondary | ICD-10-CM

## 2020-05-16 MED ORDER — UMECLIDINIUM-VILANTEROL 62.5-25 MCG/INH IN AEPB: 1.0000 | INHALATION_SPRAY | Freq: Every day | RESPIRATORY_TRACT | Status: DC

## 2020-05-16 NOTE — Progress Notes (Signed)
Location: [place] of Service:  Clinic (12)  Provider:   Code Status:  Goals of Care:  Advanced Directives 05/03/2020  Does Patient Have a Medical Advance Directive? No  Type of Advance Directive -  Does patient want to make changes to medical advance directive? -  Copy of [place] in Chart? -  Would patient like information on creating a medical advance directive? No - Patient declined     Chief Complaint  Patient presents with  . Transitions Of Care    Patient was in hospital 05/02/2020 - 05/04/2020.   . Medical Management of Chronic Issues    Patient returns to the clinic for follow up.   Marland Kitchen Health Maintenance    TDAP, PNA, shingles    HPI: Patient is a 86 y.o. male seen today for an acute visit for Follow up from the Hospital He was admitted to Hospital from 6/30-7/2 for CAP and Pleural Effusion  Patient has PMH  Covid Pneumonia BPH and h/o Bladder cancer, HLD and Depression  Was seen in the clinic for Chest Pain and SOB. Send to ED for Eval Was diagnosed with CAP and had Pleural effusion left Loculated Underwent US guided Thoracocentesis.  Pleural Fluid was negative for Any cancer cells. Mainly reactive effusion.  Patient came for a follow-up today.  He continues to feel short of breath with mild exertion.  Also complaining of fatigue and weakness.  No fever or chest pain just some myalgias.  Gait is still unstable.  Diet is still poor.  Has lost few pounds since his last visit with me. Is mostly staying in his room unable to leave or drive    Past Medical History:  Diagnosis Date  . Anxiety   . Asthma    as child  . Bladder cancer [facility]) urologist-  dr Elliot Gault [facility] in [place], [place])   dx 01/ 2016--- post TURBT and post Bladder bx 02-10-2017  . Bladder tumor   . BPH (benign prostatic hyperplasia)   . Frequency of urination   . History of asthma    childhood  . History of external beam radiation therapy     2002  prostate/ pelvis and boost w/ radioactive prostate seed implants   . History of hypercalcemia    s/p  parathyroidectomy 2003  . History of prostate cancer current urologist-  dr Elliot Gault [facility] in [place], [place])-- per lov note (care everywhere) last PSA undetectable   dx 2002--  urologist-- dr dahlstedt/ oncologist dr [place]re Dross---  Gleason 7 out of 10, PSA 4.9---post Radioactive Prostate Seed Implant and External Beam Radiation therapy  . History of skin cancer   . Hyperlipidemia   . Multiple lung nodules    since 2006--- followed by pcp --- dr Maudie Mercury  . Renal cyst, left   . Wears glasses   . Wears hearing aid in both ears     Past Surgical History:  Procedure Laterality Date  . CARDIOVASCULAR STRESS TEST  01/01/2004   normal nuclear perfustion study w/ no ischemia/  normal LV function and wall motion, ef 65%  . CATARACT EXTRACTION W/ INTRAOCULAR LENS  IMPLANT, BILATERAL  2010  . COLONOSCOPY    . CYSTO/  BILATERAL RETROGRADE PYELOGRAM/  BLADDER BX'S AND FULGERATION  02-10-2017   dr Anabel Bene [facility] in [place], [place]  . CYSTOSCOPY W/ RETROGRADES Bilateral 07/27/2017   Procedure: CYSTOSCOPY,SELECTIVE CYTOLOGIES,RETROGRADE PYELOGRAM;  Surgeon: Cleon Gustin, MD;  Location: [facility];  Service: Urology;  Laterality: Bilateral;  . CYSTOSCOPY W/ RETROGRADES Bilateral 12/08/2017   Procedure: CYSTOSCOPY WITH RETROGRADE PYELOGRAM;  Surgeon: Cleon Gustin, MD;  Location: [facility];  Service: Urology;  Laterality: Bilateral;  . CYSTOSCOPY WITH BIOPSY N/A 07/27/2017   Procedure: CYSTOSCOPY WITH BIOPSY OF BLADDER AND PROSTATIC URETHRA;  Surgeon: Cleon Gustin, MD;  Location: [facility];  Service: Urology;  Laterality: N/A;  . CYSTOSCOPY WITH BIOPSY Bilateral 12/08/2017   Procedure: CYSTOSCOPY WITH RENAL WASHINGS;  Surgeon: Cleon Gustin, MD;  Location: [facility];  Service: Urology;  Laterality:  Bilateral;  . FIBEROPTIC BRONCHOSCOPY  08-20-2005   dr wert   w/ Left lower lobe bx  . PARATHYROIDECTOMY  2003  . RADIOACTIVE PROSTATE SEED IMPLANTS  04-05-2001    dr Diona Fanti [facility]  . TONSILLECTOMY  child  . TRANSURETHRAL RESECTION OF BLADDER TUMOR  11-24-2014   dr Anabel Bene [facility] in [place], [place]  . TRANSURETHRAL RESECTION OF BLADDER TUMOR N/A 04/08/2018   Procedure: TRANSURETHRAL RESECTION OF BLADDER TUMOR (TURBT);  Surgeon: Cleon Gustin, MD;  Location: [facility];  Service: Urology;  Laterality: N/A;  . TRANSURETHRAL RESECTION OF PROSTATE N/A 02/28/2019   Procedure: TRANSURETHRAL RESECTION OF THE PROSTATE (TURP);  Surgeon: Cleon Gustin, MD;  Location: [facility];  Service: Urology;  Laterality: N/A;  30 MINS  . UPPER GI ENDOSCOPY      Allergies  Allergen Reactions  . Adhesive [Tape] Other (See Comments)    Tears skin off PAPER TAPE IS OKAY    Outpatient Encounter Medications as of 05/16/2020  Medication Sig  . acetaminophen (TYLENOL) 325 MG tablet Take 325 mg by mouth every 6 (six) hours as needed (for pain).  . chlordiazePOXIDE (LIBRIUM) 10 MG capsule Take 10 mg by mouth daily.  . Cholecalciferol (VITAMIN D3) 1000 units CAPS Take 1,000 Units by mouth daily.  . Multiple Vitamins-Minerals (PRESERVISION AREDS 2 PO) Take 1 tablet by mouth in the morning and at bedtime.  . pravastatin (PRAVACHOL) 40 MG tablet Take 40 mg by mouth daily. In the morning.  . saccharomyces boulardii (FLORASTOR) 250 MG capsule Take 250 mg by mouth 2 (two) times daily.  . solifenacin (VESICARE) 10 MG tablet Take 10 mg by mouth daily. In the morning.  . tamsulosin (FLOMAX) 0.4 MG CAPS capsule Take 0.4 mg by mouth daily.  . vitamin B-12 (CYANOCOBALAMIN) 1000 MCG tablet Take 1,000 mcg by mouth daily.  . [DISCONTINUED] chlordiazePOXIDE (LIBRIUM) 10 MG capsule TAKE 1 CAPSULE BY MOUTH THREE TIMES DAILY AS NEEDED   Facility-Administered Encounter Medications  as of 05/16/2020  Medication  . umeclidinium-vilanterol (ANORO ELLIPTA) 62.5-25 MCG/INH 1 puff    Review of Systems:  Review of Systems  Constitutional: Positive for activity change and appetite change.  HENT: Negative.   Respiratory: Positive for shortness of breath.   Cardiovascular: Negative.  Negative for chest pain.  Gastrointestinal: Negative.   Genitourinary: Negative.   Musculoskeletal: Positive for gait problem.  Skin: Negative.   Neurological: Positive for weakness. Negative for dizziness.  Psychiatric/Behavioral: Negative.     Health Maintenance  Topic Date Due  . TETANUS/TDAP  Never done  . PNA vac Low Risk Adult (2 of 2 - PPSV23) 05/19/2018  . INFLUENZA VACCINE  06/03/2020  . COVID-19 Vaccine  Completed    Physical Exam: Vitals:   05/16/20 1306  BP: (!) 92/50  Pulse: (!) 108  SpO2: 94%  Weight: 117 lb  12.8 oz (53.4 kg)  Height: 6' (1.829 m)   Body mass index is 15.98 kg/m. Physical Exam Vitals reviewed.  Constitutional:      Comments: Very Frail  HENT:     Head: Normocephalic.     Nose: Nose normal.     Mouth/Throat:     Mouth: Mucous membranes are moist.     Pharynx: Oropharynx is clear.  Eyes:     Pupils: Pupils are equal, round, and reactive to light.  Cardiovascular:     Rate and Rhythm: Normal rate and regular rhythm.     Pulses: Normal pulses.  Pulmonary:     Effort: Pulmonary effort is normal. No respiratory distress.     Breath sounds: Normal breath sounds. No wheezing or rales.  Abdominal:     General: Abdomen is flat. Bowel sounds are normal. There is no distension.     Palpations: Abdomen is soft.     Tenderness: There is no abdominal tenderness.  Musculoskeletal:        General: No swelling.     Cervical back: Neck supple.  Skin:    General: Skin is warm and dry.  Neurological:     General: No focal deficit present.     Mental Status: He is alert and oriented to person, place, and time.     Comments: Was not dizzy but got  tired within few steps  Psychiatric:        Mood and Affect: Mood normal.        Thought Content: Thought content normal.     Labs reviewed: Basic Metabolic Panel: Recent Labs    11/24/19 0235 11/25/19 0104 01/12/20 1356 04/12/20 1401 05/02/20 1858 05/03/20 0337 05/04/20 0340  NA 141   < > 138   < > 141 140 142  K 3.7   < > 4.3   < > 4.0 3.6 3.4*  CL 106   < > 106   < > 103 104 111  CO2 26   < > 25   < > 26 23 23   GLUCOSE 104*   < > 94   < > 124* 114* 96  BUN 42*   < > 20   < > 25* 21 19  CREATININE 0.81   < > 1.00   < > 1.37* 1.21 1.13  CALCIUM 8.3*   < > 8.8   < > 8.3* 7.5* 7.6*  MG 2.4  --   --   --   --   --   --   TSH  --   --  1.46  --   --   --   --    < > = values in this interval not displayed.   Liver Function Tests: Recent Labs    11/24/19 0235 11/24/19 0235 11/25/19 0104 11/25/19 0104 11/30/19 0000 01/12/20 1356 04/12/20 1401 05/03/20 0337  AST 39   < > 37   < > 29 12 18 26   ALT 66*   < > 64*   < > 56* 9 23 42  ALKPHOS 45   < > 49  --  39  --   --  69  BILITOT 0.9   < > 0.7   < >  --  0.4 0.3 0.7  PROT 5.2*   < > 5.5*   < >  --  5.6* 5.9* 5.1*  ALBUMIN 2.7*  --  2.9*  --   --   --   --  2.3*   < > =  values in this interval not displayed.   No results for input(s): LIPASE, AMYLASE in the last 8760 hours. No results for input(s): AMMONIA in the last 8760 hours. CBC: Recent Labs    11/25/19 0104 12/05/19 0000 01/12/20 1356 01/12/20 1356 04/12/20 1401 04/12/20 1401 05/02/20 1858 05/03/20 0337 05/04/20 0340  WBC   < > 3.2 7.7   < > 8.3   < > 14.2* 12.4* 7.6  NEUTROABS  --  1,261 3,688  --  5,071  --   --   --   --   HGB   < > 11.9* 12.9*   < > 13.5   < > 13.3 10.9* 10.7*  HCT   < > 35* 38.2*   < > 40.1   < > 41.0 33.1* 32.8*  MCV   < >  --  92.3   < > 89.5   < > 89.1 88.0 88.2  PLT   < > 159 245   < > 319   < > 393 332 309   < > = values in this interval not displayed.   Lipid Panel: Recent Labs    11/20/19 1203 01/12/20 1356  CHOL   --  150  HDL  --  62  LDLCALC  --  73  TRIG 91 66  CHOLHDL  --  2.4   No results found for: HGBA1C  Procedures since last visit: DG Chest 1 View  Result Date: 05/03/2020 CLINICAL DATA:  Status post left thoracentesis. EXAM: CHEST  1 VIEW COMPARISON:  05/02/2020 FINDINGS: Normal heart size. Interval decrease in volume of left pleural effusion status post thoracentesis. No pneumothorax visualized. Small right pleural effusion unchanged. Lungs are hyperinflated with interstitial changes of emphysema. No airspace opacities. IMPRESSION: 1. Decrease in volume of left pleural effusion status post thoracentesis. 2. No pneumothorax. Electronically Signed   By: Kerby Moors M.D.   On: 05/03/2020 11:10   DG Chest 2 View  Result Date: 05/02/2020 CLINICAL DATA:  Shortness of breath. EXAM: CHEST - 2 VIEW COMPARISON:  April 11, 2020. FINDINGS: The heart size and mediastinal contours are within normal limits. No pneumothorax is noted. Minimal right pleural effusion is noted. Mild left pleural effusion is noted with associated atelectasis or infiltrate. The visualized skeletal structures are unremarkable. IMPRESSION: Minimal right pleural effusion. Mild left pleural effusion with associated atelectasis or infiltrate. Electronically Signed   By: Marijo Conception M.D.   On: 05/02/2020 19:26   CT Angio Chest PE W and/or Wo Contrast  Result Date: 05/02/2020 CLINICAL DATA:  Shortness of breath, hypotension, tachycardia, hypoxia EXAM: CT ANGIOGRAPHY CHEST WITH CONTRAST TECHNIQUE: Multidetector CT imaging of the chest was performed using the standard protocol during bolus administration of intravenous contrast. Multiplanar CT image reconstructions and MIPs were obtained to evaluate the vascular anatomy. CONTRAST:  29mL OMNIPAQUE IOHEXOL 350 MG/ML SOLN COMPARISON:  05/02/2020, 05/31/2007 FINDINGS: Cardiovascular: This is a technically adequate evaluation of the pulmonary vasculature. No filling defects or pulmonary emboli.  There is a new pericardial effusion measuring up to 1 cm in thickness. Echocardiography may be useful for further evaluation. The thoracic aorta is normal in caliber, with minimal atherosclerosis. No evidence of dissection. Mediastinum/Nodes: No enlarged mediastinal, hilar, or axillary lymph nodes. Thyroid gland, trachea, and esophagus demonstrate no significant findings. Lungs/Pleura: There are bilateral pleural effusions, left greater than right. Volume on the left less than 1 L, and volume on the right less than 100 cc. The left-sided effusion may be partially loculated.  Extensive background emphysema and scarring. Dependent atelectasis within the lower lobes. No acute airspace disease. No pneumothorax. Upper Abdomen: No acute abnormality. Musculoskeletal: No acute or destructive bony lesions. There is extensive spondylosis throughout the visualized thoracolumbar spine with bridging anterior osteophytes. Reconstructed images demonstrate no additional findings. Review of the MIP images confirms the above findings. IMPRESSION: 1. Small pericardial effusion. Echocardiography may be useful for further evaluation. 2. Bilateral pleural effusions, left greater than right. Left-sided effusion is partially loculated. 3. No evidence of pulmonary embolus. 4. Aortic Atherosclerosis (ICD10-I70.0) and Emphysema (ICD10-J43.9). Electronically Signed   By: Randa Ngo M.D.   On: 05/02/2020 21:41   ECHOCARDIOGRAM COMPLETE  Result Date: 05/03/2020    ECHOCARDIOGRAM REPORT   Patient Name:   Jesse [name] Date of Exam: 05/03/2020 Medical Rec #:  [id number]              Height:       72.0 in Accession #:    [id number]             Weight:       119.3 lb Date of Birth:  Nov 14, 1931              BSA:          43.711 m Patient Age:    57 years               BP:           95/56 mmHg Patient Gender: M                      HR:           97 bpm. Exam Location:  Inpatient Procedure: 2D Echo Indications:    Pericardial effusion 423.9  / I31.3  History:        Patient has no prior history of Echocardiogram examinations.                 Signs/Symptoms:Shortness of Breath and Hypotension; Risk                 Factors:Dyslipidemia. COVID-19 infection in January 2021.                 Left-sided pleuritic pain. Pneumonia. Past history of bladder                 and prostate cancer.  Sonographer:    Darlina Sicilian RDCS Referring Phys: [id number] [place]  1. Left ventricular ejection fraction, by estimation, is 60 to 65%. The left ventricle has normal function. The left ventricle has no regional wall motion abnormalities. Left ventricular diastolic parameters are indeterminate.  2. Right ventricular systolic function is normal. The right ventricular size is normal. There is mildly elevated pulmonary artery systolic pressure. The estimated right ventricular systolic pressure is 88.5 mmHg.  3. There is a large epicardial adipose layer with a small adjacent pericardial effusion anterior to the right ventricle. No definite features of tamponade physiology. Full assessment for constrictive physiology not performed.  4. The mitral valve is normal in structure. Trivial mitral valve regurgitation. No evidence of mitral stenosis.  5. Tricuspid valve regurgitation is mild to moderate.  6. The aortic valve is tricuspid. Aortic valve regurgitation is not visualized. No aortic stenosis is present.  7. The inferior vena cava is normal in size with <50% respiratory variability, suggesting right atrial pressure of 8 mmHg. FINDINGS  Left Ventricle: Left ventricular ejection fraction, by estimation,  is 60 to 65%. The left ventricle has normal function. The left ventricle has no regional wall motion abnormalities. The left ventricular internal cavity size was normal in size. There is  no left ventricular hypertrophy. Left ventricular diastolic parameters are indeterminate. Right Ventricle: The right ventricular size is normal. No increase in right  ventricular wall thickness. Right ventricular systolic function is normal. There is mildly elevated pulmonary artery systolic pressure. The tricuspid regurgitant velocity is 2.66  m/s, and with an assumed right atrial pressure of 10 mmHg, the estimated right ventricular systolic pressure is 47.6 mmHg. Left Atrium: Left atrial size was normal in size. Right Atrium: Right atrial size was normal in size. Pericardium: There is a large epicardial adipose layer with a small adjacent pericardial effusion anterior to the right ventricle. No definite features of tamponade physiology. Full assessment for constrictive physiology not performed. A small pericardial effusion is present. Presence of pericardial fat pad. Mitral Valve: The mitral valve is normal in structure. Normal mobility of the mitral valve leaflets. Trivial mitral valve regurgitation. No evidence of mitral valve stenosis. Tricuspid Valve: The tricuspid valve is normal in structure. Tricuspid valve regurgitation is mild to moderate. No evidence of tricuspid stenosis. Aortic Valve: The aortic valve is tricuspid. Aortic valve regurgitation is not visualized. No aortic stenosis is present. Pulmonic Valve: The pulmonic valve was normal in structure. Pulmonic valve regurgitation is mild. No evidence of pulmonic stenosis. Aorta: The aortic root is normal in size and structure. Venous: The inferior vena cava is normal in size with less than 50% respiratory variability, suggesting right atrial pressure of 8 mmHg. IAS/Shunts: No atrial level shunt detected by color flow Doppler. There is a tiny area of color flow that appears most consisent with color artifact.  LEFT VENTRICLE PLAX 2D LVIDd:         3.90 cm  Diastology LVIDs:         2.70 cm  LV e' lateral:   5.77 cm/s LV PW:         1.00 cm  LV E/e' lateral: 8.8 LV IVS:        1.00 cm  LV e' medial:    5.77 cm/s LVOT diam:     2.00 cm  LV E/e' medial:  8.8 LV SV:         31 LV SV Index:   18 LVOT Area:     3.14 cm   LEFT ATRIUM           Index       RIGHT ATRIUM           Index LA diam:      2.50 cm 1.46 cm/m  RA Area:     12.20 cm LA Vol (A4C): 25.8 ml 15.08 ml/m RA Volume:   27.30 ml  15.96 ml/m  AORTIC VALVE LVOT Vmax:   71.70 cm/s LVOT Vmean:  39.700 cm/s LVOT VTI:    0.098 m  AORTA Ao Root diam: 3.00 cm MITRAL VALVE               TRICUSPID VALVE MV Area (PHT): 3.93 cm    TR Peak grad:   28.3 mmHg MV Decel Time: 193 msec    TR Vmax:        266.00 cm/s MV E velocity: 50.60 cm/s MV A velocity: 51.80 cm/s  SHUNTS MV E/A ratio:  0.98        Systemic VTI:  0.10 m  Systemic Diam: 2.00 cm Cherlynn Kaiser MD Electronically signed by Cherlynn Kaiser MD Signature Date/Time: 05/03/2020/2:02:39 PM    Final    US THORACENTESIS ASP PLEURAL SPACE W/IMG GUIDE  Result Date: 05/03/2020 INDICATION: Patient with history of dyspnea and pleuritic chest pain x2 weeks, bilateral pleural effusions (L>R), and pericardial effusion. Request is made for diagnostic and therapeutic left thoracentesis. EXAM: ULTRASOUND GUIDED DIAGNOSTIC AND THERAPEUTIC LEFT THORACENTESIS MEDICATIONS: 8 mL 1% lidocaine COMPLICATIONS: None immediate. PROCEDURE: An ultrasound guided thoracentesis was thoroughly discussed with the patient and questions answered. The benefits, risks, alternatives and complications were also discussed. The patient understands and wishes to proceed with the procedure. Written consent was obtained. Ultrasound was performed to localize and mark an adequate pocket of fluid in the left chest. The area was then prepped and draped in the normal sterile fashion. 1% Lidocaine was used for local anesthesia. Under ultrasound guidance a 6 Fr Safe-T-Centesis catheter was introduced. Thoracentesis was performed. The catheter was removed and a dressing applied. FINDINGS: A total of approximately 1.05 L of dark red fluid was removed. Samples were sent to the laboratory as requested by the clinical team. IMPRESSION: Successful  ultrasound guided left thoracentesis yielding 1.05 L of pleural fluid. Read by: Earley Abide, PA-C Electronically Signed   By: Jacqulynn Cadet M.D.   On: 05/03/2020 11:16    Assessment/Plan SOB (shortness of breath)/ Due to Emphysema -  Continues to be issue especially with Exertion Plan: Urgent Ambulatory referral to Pulmonology,  Ambulatory referral to Physical Therapy Will Also Arrange with [employer] for Portable Oxygen Will start on Anoro inhaler as suggested by Pulmonology Discussed with him about Possible Admission in SNF but he wants to wait Also told him to go to hospital if SOB worsens  Weakness - Plan: Ambulatory referral to Physical Therapy  Tachycardia Due to general Physical Decline Repeat Labs Poor appetite Not sure right now the Etiology Possible Post Pneumonia Will continue to monitor Was on Remeron at sometime but then he stopped  Other Issues Anxiety Stable on Librium Benign prostatic hyperplasiaAnd H/o Bladder Cancer Doing well on Vesicare and Flomax Sees Urology Q  6 M Hyperlipidemia Continue on Statin LDL less then 100 B 12 def Continue Supplement Levels Normal Labs/tests ordered:  * No order type specified * Next appt:  Visit date not found

## 2020-05-17 MED ORDER — UMECLIDINIUM-VILANTEROL 62.5-25 MCG/INH IN AEPB: 1.0000 | INHALATION_SPRAY | Freq: Every day | RESPIRATORY_TRACT | 3 refills | Status: DC

## 2020-05-18 ENCOUNTER — Telehealth: Payer: Self-pay

## 2020-05-18 NOTE — Telephone Encounter (Signed)
[employer] oxygen request form and office notes faxed to [facility] [phone]. DWP next lab apnt at the office.

## 2020-05-18 NOTE — Telephone Encounter (Signed)
Between 2-3 Litres to keep Pox more then 90 %

## 2020-05-18 NOTE — Telephone Encounter (Signed)
Patient called to say that his son brought him a new oxygen condenser for his oxygen and he would like to know what setting you would have him to put it on Please Advise

## 2020-05-18 NOTE — Telephone Encounter (Signed)
Patient states he is feeling even better today.

## 2020-05-29 ENCOUNTER — Ambulatory Visit (INDEPENDENT_AMBULATORY_CARE_PROVIDER_SITE_OTHER): Payer: Medicare Other | Admitting: Pulmonary Disease

## 2020-05-29 ENCOUNTER — Encounter: Payer: Self-pay | Admitting: Pulmonary Disease

## 2020-05-29 ENCOUNTER — Other Ambulatory Visit: Payer: Self-pay

## 2020-05-29 VITALS — BP 118/66 | HR 90 | Temp 98.1°F | Ht 68.0 in | Wt 117.0 lb

## 2020-05-29 DIAGNOSIS — J431 Panlobular emphysema: Secondary | ICD-10-CM

## 2020-05-29 DIAGNOSIS — R0602 Shortness of breath: Secondary | ICD-10-CM | POA: Diagnosis not present

## 2020-05-29 MED ORDER — ALBUTEROL SULFATE HFA 108 (90 BASE) MCG/ACT IN AERS: 2.0000 | INHALATION_SPRAY | Freq: Four times a day (QID) | RESPIRATORY_TRACT | 5 refills | Status: AC | PRN

## 2020-05-29 MED ORDER — UMECLIDINIUM-VILANTEROL 62.5-25 MCG/INH IN AEPB: 1.0000 | INHALATION_SPRAY | Freq: Every day | RESPIRATORY_TRACT | 6 refills | Status: DC

## 2020-05-29 NOTE — Patient Instructions (Addendum)
Continue anoro Prescribe albuterol rescue to be used  I will see you back in 3 months  Call with any significant concerns  Check your pulse ox regularly, as long as you are above 90 all the time you are okay  You do not need to use oxygen unless this is staying below 90

## 2020-05-29 NOTE — Progress Notes (Signed)
Jesse [name]    [id number]    [birth year]  Primary Care Physician:Gupta, Rene Kocher, MD  Referring Physician: Virgie Dad, MD [place],  [place] [phone]  Chief complaint:   Patient seen for shortness of breath  HPI: Was seen recently in the hospital with pleural effusion Pleural fluid was exudative with negative cytology  He did have a pneumonia at the time  he is better  He does get short of breath with activity Walks regularly  Breathing is much better than when he was in the hospital  He currently uses Anoro on a daily basis He does not have a rescue inhaler  Some days are better than others   Outpatient Encounter Medications as of 05/29/2020  Medication Sig  . acetaminophen (TYLENOL) 325 MG tablet Take 325 mg by mouth every 6 (six) hours as needed (for pain).  . chlordiazePOXIDE (LIBRIUM) 10 MG capsule Take 10 mg by mouth daily.  . Cholecalciferol (VITAMIN D3) 1000 units CAPS Take 1,000 Units by mouth daily.  . Multiple Vitamins-Minerals (PRESERVISION AREDS 2 PO) Take 1 tablet by mouth in the morning and at bedtime.  . pravastatin (PRAVACHOL) 40 MG tablet Take 40 mg by mouth daily. In the morning.  . saccharomyces boulardii (FLORASTOR) 250 MG capsule Take 250 mg by mouth 2 (two) times daily.  . solifenacin (VESICARE) 10 MG tablet Take 10 mg by mouth daily. In the morning.  . tamsulosin (FLOMAX) 0.4 MG CAPS capsule Take 0.4 mg by mouth daily.  Marland Kitchen umeclidinium-vilanterol (ANORO ELLIPTA) 62.5-25 MCG/INH AEPB Inhale 1 puff into the lungs daily.  . vitamin B-12 (CYANOCOBALAMIN) 1000 MCG tablet Take 1,000 mcg by mouth daily.   No facility-administered encounter medications on file as of 05/29/2020.    Allergies as of 05/29/2020 - Review Complete 05/29/2020  Allergen Reaction Noted  . Adhesive [tape] Other (See Comments) 02/25/2019    Past Medical History:  Diagnosis Date  . Anxiety   . Asthma    as child  . Bladder cancer  [facility]) urologist-  dr Elliot Gault [facility] in [place], [place])   dx 01/ 2016--- post TURBT and post Bladder bx 02-10-2017  . Bladder tumor   . BPH (benign prostatic hyperplasia)   . Frequency of urination   . History of asthma    childhood  . History of external beam radiation therapy    2002  prostate/ pelvis and boost w/ radioactive prostate seed implants   . History of hypercalcemia    s/p  parathyroidectomy 2003  . History of prostate cancer current urologist-  dr Elliot Gault [facility] in [place], [place])-- per lov note (care everywhere) last PSA undetectable   dx 2002--  urologist-- dr dahlstedt/ oncologist dr Valere Dross---  Gleason 7 out of 10, PSA 4.9---post Radioactive Prostate Seed Implant and External Beam Radiation therapy  . History of skin cancer   . Hyperlipidemia   . Multiple lung nodules    since 2006--- followed by pcp --- dr Maudie Mercury  . Renal cyst, left   . Wears glasses   . Wears hearing aid in both ears     Past Surgical History:  Procedure Laterality Date  . CARDIOVASCULAR STRESS TEST  01/01/2004   normal nuclear perfustion study w/ no ischemia/  normal LV function and wall motion, ef 65%  . CATARACT EXTRACTION W/ INTRAOCULAR LENS  IMPLANT, BILATERAL  2010  . COLONOSCOPY    . CYSTO/  BILATERAL  RETROGRADE PYELOGRAM/  BLADDER BX'S AND FULGERATION  02-10-2017   dr Anabel Bene [facility] in [place], [place]  . CYSTOSCOPY W/ RETROGRADES Bilateral 07/27/2017   Procedure: CYSTOSCOPY,SELECTIVE CYTOLOGIES,RETROGRADE PYELOGRAM;  Surgeon: Cleon Gustin, MD;  Location: [facility];  Service: Urology;  Laterality: Bilateral;  . CYSTOSCOPY W/ RETROGRADES Bilateral 12/08/2017   Procedure: CYSTOSCOPY WITH RETROGRADE PYELOGRAM;  Surgeon: Cleon Gustin, MD;  Location: [facility];  Service: Urology;  Laterality: Bilateral;  . CYSTOSCOPY WITH BIOPSY N/A 07/27/2017   Procedure: CYSTOSCOPY WITH BIOPSY OF BLADDER AND PROSTATIC URETHRA;  Surgeon:  Cleon Gustin, MD;  Location: [facility];  Service: Urology;  Laterality: N/A;  . CYSTOSCOPY WITH BIOPSY Bilateral 12/08/2017   Procedure: CYSTOSCOPY WITH RENAL WASHINGS;  Surgeon: Cleon Gustin, MD;  Location: [facility];  Service: Urology;  Laterality: Bilateral;  . FIBEROPTIC BRONCHOSCOPY  08-20-2005   dr wert   w/ Left lower lobe bx  . PARATHYROIDECTOMY  2003  . RADIOACTIVE PROSTATE SEED IMPLANTS  04-05-2001    dr Diona Fanti [facility]  . TONSILLECTOMY  child  . TRANSURETHRAL RESECTION OF BLADDER TUMOR  11-24-2014   dr Anabel Bene [facility] in [place], [place]  . TRANSURETHRAL RESECTION OF BLADDER TUMOR N/A 04/08/2018   Procedure: TRANSURETHRAL RESECTION OF BLADDER TUMOR (TURBT);  Surgeon: Cleon Gustin, MD;  Location: [facility];  Service: Urology;  Laterality: N/A;  . TRANSURETHRAL RESECTION OF PROSTATE N/A 02/28/2019   Procedure: TRANSURETHRAL RESECTION OF THE PROSTATE (TURP);  Surgeon: Cleon Gustin, MD;  Location[facility];  Service: Urology;  Laterality: N/A;  30 MINS  . UPPER GI ENDOSCOPY      History reviewed. No pertinent family history.  Social History   Socioeconomic History  . Marital status: Married    Spouse name: Not on file  . Number of children: Not on file  . Years of education: Not on file  . Highest education level: Not on file  Occupational History  . Not on file  Tobacco Use  . Smoking status: Former Smoker    Years: 50.00    Types: Cigarettes    Quit date: 11/03/1984    Years since quitting: 35.5  . Smokeless tobacco: Never Used  Vaping Use  . Vaping Use: Never used  Substance and Sexual Activity  . Alcohol use: Yes    Alcohol/week: 7.0 standard drinks    Types: 7 Glasses of wine per week    Comment: daily wine  . Drug use: No  . Sexual activity: Not Currently  Other Topics Concern  . Not on file  Social History Narrative  . Not on file   Social Determinants of  Health   Financial Resource Strain:   . Difficulty of Paying Living Expenses:   Food Insecurity:   . Worried About Charity fundraiser in the Last Year:   . Arboriculturist in the Last Year:   Transportation Needs:   . Film/video editor (Medical):   Marland Kitchen Lack of Transportation (Non-Medical):   Physical Activity:   . Days of Exercise per Week:   . Minutes of Exercise per Session:   Stress:   . Feeling of Stress :   Social Connections:   . Frequency of Communication with Friends and Family:   . Frequency of Social Gatherings with Friends and Family:   . Attends Religious Services:   . Active Member of Clubs or Organizations:   . Attends  Club or Organization Meetings:   Marland Kitchen Marital Status:   Intimate Partner Violence:   . Fear of Current or Ex-Partner:   . Emotionally Abused:   Marland Kitchen Physically Abused:   . Sexually Abused:     Review of Systems  Constitutional: Positive for fatigue.  Respiratory: Positive for shortness of breath. Negative for cough.   All other systems reviewed and are negative.   Vitals:   05/29/20 1009  BP: 118/66  Pulse: 90  Temp: 98.1 F (36.7 C)  SpO2: 95%     Physical Exam Constitutional:      Appearance: Normal appearance.  HENT:     Head: Normocephalic.     Nose: Nose normal. No congestion.     Mouth/Throat:     Mouth: Mucous membranes are moist.     Pharynx: No oropharyngeal exudate or posterior oropharyngeal erythema.  Cardiovascular:     Rate and Rhythm: Normal rate and regular rhythm.     Pulses: Normal pulses.     Heart sounds: Normal heart sounds. No murmur heard.  No friction rub.  Pulmonary:     Effort: Pulmonary effort is normal. No respiratory distress.     Breath sounds: Normal breath sounds. No stridor. No wheezing or rhonchi.  Abdominal:     Hernia: Hernia:    Musculoskeletal:     Cervical back: No rigidity.  Neurological:     General: No focal deficit present.     Mental Status: He is alert.    Data Reviewed: CT  chest reviewed with the patient showing extensive emphysema, large left pleural effusion for which she had had thoracentesis  Assessment:   Chronic obstructive pulmonary disease  Shortness of breath with activity  Recent pleural effusion -Exudative, cytology negative -Likely related to pneumonia for which she was fully treated  Plan/Recommendations: Continue Anoro  Rescue inhaler prescription provided  Graded exercise as tolerated  I will see him back in 3 months  Encouraged to call with any significant concerns  We will continue to benefit from graded physical activity  Questions about oxygen supplementation was answered He does not require oxygen supplementation at the present time Encouraged to check his oximetry on a regular basis and keep her saturations above 90%  Sherrilyn Rist MD [facility] 05/29/2020, 10:36 AM  CC: Virgie Dad, MD

## 2020-05-31 ENCOUNTER — Other Ambulatory Visit: Payer: Self-pay

## 2020-05-31 ENCOUNTER — Other Ambulatory Visit: Payer: Medicare Other

## 2020-05-31 DIAGNOSIS — E785 Hyperlipidemia, unspecified: Secondary | ICD-10-CM

## 2020-05-31 DIAGNOSIS — R531 Weakness: Secondary | ICD-10-CM

## 2020-05-31 DIAGNOSIS — R0602 Shortness of breath: Secondary | ICD-10-CM

## 2020-05-31 LAB — COMPLETE METABOLIC PANEL WITH GFR
AG Ratio: 1.3 (calc) (ref 1.0–2.5)
ALT: 11 U/L (ref 9–46)
AST: 15 U/L (ref 10–35)
Albumin: 3.6 g/dL (ref 3.6–5.1)
Alkaline phosphatase (APISO): 62 U/L (ref 35–144)
BUN/Creatinine Ratio: 14 (calc) (ref 6–22)
BUN: 25 mg/dL (ref 7–25)
CO2: 24 mmol/L (ref 20–32)
Calcium: 9 mg/dL (ref 8.6–10.3)
Chloride: 102 mmol/L (ref 98–110)
Creat: 1.73 mg/dL — ABNORMAL HIGH (ref 0.70–1.11)
GFR, Est African American: 41 mL/min/{1.73_m2} — ABNORMAL LOW (ref >=60)
GFR, Est Non African American: 35 mL/min/{1.73_m2} — ABNORMAL LOW (ref >=60)
Globulin: 2.7 g/dL (calc) (ref 1.9–3.7)
Glucose, Bld: 83 mg/dL (ref 65–99)
Potassium: 4 mmol/L (ref 3.5–5.3)
Sodium: 139 mmol/L (ref 135–146)
Total Bilirubin: 0.5 mg/dL (ref 0.2–1.2)
Total Protein: 6.3 g/dL (ref 6.1–8.1)

## 2020-05-31 LAB — LIPID PANEL
Cholesterol: 167 mg/dL (ref <200)
HDL: 61 mg/dL (ref >=40)
LDL Cholesterol (Calc): 85 mg/dL (calc)
Non-HDL Cholesterol (Calc): 106 mg/dL (calc) (ref <130)
Total CHOL/HDL Ratio: 2.7 (calc) (ref <5.0)
Triglycerides: 117 mg/dL (ref <150)

## 2020-05-31 LAB — CBC WITH DIFFERENTIAL/PLATELET
Absolute Monocytes: 600 cells/uL (ref 200–950)
Basophils Absolute: 23 cells/uL (ref 0–200)
Basophils Relative: 0.3 %
Eosinophils Absolute: 30 cells/uL (ref 15–500)
Eosinophils Relative: 0.4 %
HCT: 38.6 % (ref 38.5–50.0)
Hemoglobin: 12.6 g/dL — ABNORMAL LOW (ref 13.2–17.1)
Lymphs Abs: 2813 cells/uL (ref 850–3900)
MCH: 28.8 pg (ref 27.0–33.0)
MCHC: 32.6 g/dL (ref 32.0–36.0)
MCV: 88.1 fL (ref 80.0–100.0)
MPV: 9.6 fL (ref 7.5–12.5)
Monocytes Relative: 8 %
Neutro Abs: 4035 cells/uL (ref 1500–7800)
Neutrophils Relative %: 53.8 %
Platelets: 258 10*3/uL (ref 140–400)
RBC: 4.38 10*6/uL (ref 4.20–5.80)
RDW: 14 % (ref 11.0–15.0)
Total Lymphocyte: 37.5 %
WBC: 7.5 10*3/uL (ref 3.8–10.8)

## 2020-06-01 ENCOUNTER — Other Ambulatory Visit: Payer: Self-pay | Admitting: Nurse Practitioner

## 2020-06-01 DIAGNOSIS — R630 Anorexia: Secondary | ICD-10-CM

## 2020-06-05 ENCOUNTER — Encounter: Payer: Self-pay | Admitting: Nurse Practitioner

## 2020-06-05 ENCOUNTER — Non-Acute Institutional Stay: Payer: Medicare Other | Admitting: Nurse Practitioner

## 2020-06-05 ENCOUNTER — Other Ambulatory Visit: Payer: Self-pay

## 2020-06-05 DIAGNOSIS — N400 Enlarged prostate without lower urinary tract symptoms: Secondary | ICD-10-CM | POA: Diagnosis not present

## 2020-06-05 DIAGNOSIS — N1832 Chronic kidney disease, stage 3b: Secondary | ICD-10-CM

## 2020-06-05 DIAGNOSIS — F419 Anxiety disorder, unspecified: Secondary | ICD-10-CM | POA: Diagnosis not present

## 2020-06-05 DIAGNOSIS — J439 Emphysema, unspecified: Secondary | ICD-10-CM | POA: Insufficient documentation

## 2020-06-05 DIAGNOSIS — E785 Hyperlipidemia, unspecified: Secondary | ICD-10-CM | POA: Insufficient documentation

## 2020-06-05 NOTE — Assessment & Plan Note (Signed)
Creat 1.73, eGFR 35 05/31/20, worsened from prior creat 1.2-1.3, repeat BMP/eGFR

## 2020-06-05 NOTE — Assessment & Plan Note (Signed)
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 Recent pleural effusion, fluid was exudative, negative cytology while treated PNA. CT chest extensive emphysema, large left pleural effusion, had thoracentesis. COPD, started rescue inhaler Albuterol prn, , graded physical activity, no O2 supplement needed.

## 2020-06-05 NOTE — Assessment & Plan Note (Signed)
Continue Librium.

## 2020-06-05 NOTE — Progress Notes (Signed)
Location:   clinic [place] of Service:  Clinic (12) Provider: Marlana Latus NP  Code Status: DNR Goals of Care: [place] 05/03/2020  Does Patient Have a Medical Advance Directive? No  Type of Advance Directive -  Does patient want to make changes to medical advance directive? -  Copy of [place] in Chart? -  Would patient like information on creating a medical advance directive? No - Patient declined     Chief Complaint  Patient presents with  . Medical Management of Chronic Issues    Patient returns for his 3 week follow up.    HPI: Patient is a 86 y.o. male seen today for medical management of chronic diseases.    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 Recent pleural effusion, fluid was exudative, negative cytology while treated PNA. CT chest extensive emphysema, large left pleural effusion, had thoracentesis. COPD, started rescue inhaler Albuterol prn, , graded physical activity, no O2 supplement needed.   Hx of BPH, on Flomax and Vesicare.   Hx of anxiety, stable, on Librium61m qd.      Past Medical History:  Diagnosis Date  . Anxiety   . Asthma    as child  . Bladder cancer ([facility]-  [facility] [place]   dx 01/ 2016--- post TURBT and post Bladder bx 02-10-2017  . Bladder tumor   . BPH (benign prostatic hyperplasia)   . Frequency of urination   . History of asthma    childhood  . History of external beam radiation therapy    2002  prostate/ pelvis and boost w/ radioactive prostate seed implants   . History of hypercalcemia    s/p  parathyroidectomy 2003  . History of prostate cancer current urologist-  [facility] [place] per lov note (care everywhere) last PSA undetectable   dx 2002--  urologist-- dr dahlstedt/ oncologist dr mValere Dross--  Gleason 7 out of 10, PSA 4.9---post Radioactive Prostate Seed Implant and External Beam Radiation therapy    . History of skin cancer   . Hyperlipidemia   . Multiple lung nodules    since 2006--- followed by pcp --- dr kMaudie Mercury . Renal cyst, left   . Wears glasses   . Wears hearing aid in both ears     Past Surgical History:  Procedure Laterality Date  . CARDIOVASCULAR STRESS TEST  01/01/2004   normal nuclear perfustion study w/ no ischemia/  normal LV function and wall motion, ef 65%  . CATARACT EXTRACTION W/ INTRAOCULAR LENS  IMPLANT, BILATERAL  2010  . COLONOSCOPY    . CYSTO/  BILATERAL RETROGRADE PYELOGRAM/  BLADDER BX'S AND FULGERATION  02-10-2017   [facility] [place] . CYSTOSCOPY W/ RETROGRADES Bilateral 07/27/2017   Procedure: CYSTOSCOPY,SELECTIVE CYTOLOGIES,RETROGRADE PYELOGRAM;  Surgeon: MCleon Gustin MD;  [facility]  Service: Urology;  Laterality: Bilateral;  . CYSTOSCOPY W/ RETROGRADES Bilateral 12/08/2017   Procedure: CYSTOSCOPY WITH RETROGRADE PYELOGRAM;  Surgeon: MCleon Gustin MD;  [facility]  Service: Urology;  Laterality: Bilateral;  . CYSTOSCOPY WITH BIOPSY N/A 07/27/2017   Procedure: CYSTOSCOPY WITH BIOPSY OF BLADDER AND PROSTATIC URETHRA;  Surgeon: MCleon Gustin MD;  [facility]  Service: Urology;  Laterality: N/A;  . CYSTOSCOPY WITH BIOPSY Bilateral 12/08/2017   Procedure: CYSTOSCOPY WITH RENAL WASHINGS;  Surgeon: MCleon Gustin MD;  [facility];  Service: Urology;  Laterality: Bilateral;  . FIBEROPTIC BRONCHOSCOPY  08-20-2005   dr wert   w/ Left lower lobe bx  . PARATHYROIDECTOMY  2003  . RADIOACTIVE PROSTATE SEED IMPLANTS  04-05-2001    dr Diona Fanti [facility]  . TONSILLECTOMY  child  . TRANSURETHRAL RESECTION OF BLADDER TUMOR  11-24-2014   dr Anabel Bene [facility] in [place], [place]  . TRANSURETHRAL RESECTION OF BLADDER TUMOR N/A 04/08/2018   Procedure: TRANSURETHRAL RESECTION OF BLADDER TUMOR (TURBT);  Surgeon:  Cleon Gustin, MD;  [facility];  Service: Urology;  Laterality: N/A;  . TRANSURETHRAL RESECTION OF PROSTATE N/A 02/28/2019   Procedure: TRANSURETHRAL RESECTION OF THE PROSTATE (TURP);  Surgeon: Cleon Gustin, MD;  [facility];  Service: Urology;  Laterality: N/A;  30 MINS  . UPPER GI ENDOSCOPY      Allergies  Allergen Reactions  . Adhesive [Tape] Other (See Comments)    Tears skin off PAPER TAPE IS OKAY    Allergies as of 06/05/2020      Reactions   Adhesive [tape] Other (See Comments)   Tears skin off PAPER TAPE IS OKAY      Medication List       Accurate as of June 05, 2020  3:16 PM. If you have any questions, ask your nurse or doctor.        acetaminophen 325 MG tablet Commonly known as: TYLENOL Take 325 mg by mouth every 6 (six) hours as needed (for pain).   albuterol 108 (90 Base) MCG/ACT inhaler Commonly known as: VENTOLIN HFA Inhale 2 puffs into the lungs every 6 (six) hours as needed.   chlordiazePOXIDE 10 MG capsule Commonly known as: LIBRIUM Take 10 mg by mouth daily.   pravastatin 40 MG tablet Commonly known as: PRAVACHOL Take 40 mg by mouth daily. In the morning.   PRESERVISION AREDS 2 PO Take 1 tablet by mouth in the morning and at bedtime.   saccharomyces boulardii 250 MG capsule Commonly known as: FLORASTOR Take 250 mg by mouth 2 (two) times daily.   solifenacin 10 MG tablet Commonly known as: VESICARE Take 10 mg by mouth daily. In the morning.   tamsulosin 0.4 MG Caps capsule Commonly known as: FLOMAX Take 0.4 mg by mouth daily.   umeclidinium-vilanterol 62.5-25 MCG/INH Aepb Commonly known as: ANORO ELLIPTA Inhale 1 puff into the lungs daily.   vitamin B-12 1000 MCG tablet Commonly known as: CYANOCOBALAMIN Take 1,000 mcg by mouth daily.   Vitamin D3 25 MCG (1000 UT) Caps Take 1,000 Units by mouth daily.       Review of Systems:  Review of Systems  Constitutional: Negative for activity  change, appetite change, fatigue and fever.  HENT: Positive for hearing loss. Negative for congestion and voice change.   Eyes: Negative for visual disturbance.  Respiratory: Positive for shortness of breath. Negative for cough and wheezing.        DOE  Cardiovascular: Negative for chest pain, palpitations and leg swelling.  Gastrointestinal: Negative for abdominal distention, abdominal pain, constipation, diarrhea, nausea and vomiting.  Genitourinary: Negative for dysuria and urgency.  Musculoskeletal: Positive for gait problem.  Skin: Negative for color change.  Neurological: Negative for speech difficulty, weakness and light-headedness.  Psychiatric/Behavioral: Negative for confusion, dysphoric mood and sleep disturbance. The patient is not nervous/anxious.     Health Maintenance  Topic Date Due  . TETANUS/TDAP  Never done  . PNA vac Low Risk Adult (  2 of 2 - PPSV23) 05/19/2018  . INFLUENZA VACCINE  06/03/2020  . COVID-19 Vaccine  Completed    Physical Exam: Vitals:   06/05/20 1337  BP: 116/62  Pulse: (!) 101  Temp: (!) 97.5 F (36.4 C)  SpO2: 95%  Weight: 117 lb 9.6 oz (53.3 kg)  Height: '5\' 8"'  (1.727 m)   Body mass index is 17.88 kg/m. Physical Exam Vitals and nursing note reviewed.  Constitutional:      Appearance: Normal appearance.  HENT:     Head: Normocephalic and atraumatic.     Nose: Nose normal.     Mouth/Throat:     Mouth: Mucous membranes are moist.  Eyes:     Extraocular Movements: Extraocular movements intact.     Conjunctiva/sclera: Conjunctivae normal.     Pupils: Pupils are equal, round, and reactive to light.  Cardiovascular:     Rate and Rhythm: Normal rate and regular rhythm.     Heart sounds: No murmur heard.      Comments: Baseline HR 90-100  Pulmonary:     Breath sounds: Normal breath sounds. No wheezing or rales.  Abdominal:     General: Bowel sounds are normal.     Palpations: Abdomen is soft.     Tenderness: There is no abdominal  tenderness.  Musculoskeletal:        General: No tenderness.     Cervical back: Normal range of motion and neck supple.     Right lower leg: No edema.     Left lower leg: No edema.  Skin:    General: Skin is warm and dry.  Neurological:     General: No focal deficit present.     Mental Status: He is alert and oriented to person, place, and time. Mental status is at baseline.     Motor: No weakness.     Coordination: Coordination normal.     Gait: Gait abnormal.  Psychiatric:        Mood and Affect: Mood normal.        Behavior: Behavior normal.        Thought Content: Thought content normal.        Judgment: Judgment normal.     Labs reviewed: Basic Metabolic Panel: Recent Labs    11/24/19 0235 11/25/19 0104 01/12/20 1356 04/12/20 1401 05/03/20 0337 05/04/20 0340 05/31/20 1354  NA 141   < > 138   < > 140 142 139  K 3.7   < > 4.3   < > 3.6 3.4* 4.0  CL 106   < > 106   < > 104 111 102  CO2 26   < > 25   < > '23 23 24  ' GLUCOSE 104*   < > 94   < > 114* 96 83  BUN 42*   < > 20   < > '21 19 25  ' CREATININE 0.81   < > 1.00   < > 1.21 1.13 1.73*  CALCIUM 8.3*   < > 8.8   < > 7.5* 7.6* 9.0  MG 2.4  --   --   --   --   --   --   TSH  --   --  1.46  --   --   --   --    < > = values in this interval not displayed.   Liver Function Tests: Recent Labs    11/24/19 0235 11/24/19 0235 11/25/19 0104 11/25/19 0104 11/30/19 0000 01/12/20 1356 04/12/20  1401 05/03/20 0337 05/31/20 1354  AST 39   < > 37   < > 29   < > '18 26 15  ' ALT 66*   < > 64*   < > 56*   < > 23 42 11  ALKPHOS 45   < > 49  --  39  --   --  69  --   BILITOT 0.9   < > 0.7  --   --    < > 0.3 0.7 0.5  PROT 5.2*   < > 5.5*  --   --    < > 5.9* 5.1* 6.3  ALBUMIN 2.7*  --  2.9*  --   --   --   --  2.3*  --    < > = values in this interval not displayed.   No results for input(s): LIPASE, AMYLASE in the last 8760 hours. No results for input(s): AMMONIA in the last 8760 hours. CBC: Recent Labs    01/12/20 1356  01/12/20 1356 04/12/20 1401 05/02/20 1858 05/03/20 0337 05/04/20 0340 05/31/20 1354  WBC 7.7   < > 8.3   < > 12.4* 7.6 7.5  NEUTROABS 3,688  --  5,071  --   --   --  4,035  HGB 12.9*   < > 13.5   < > 10.9* 10.7* 12.6*  HCT 38.2*   < > 40.1   < > 33.1* 32.8* 38.6  MCV 92.3   < > 89.5   < > 88.0 88.2 88.1  PLT 245   < > 319   < > 332 309 258   < > = values in this interval not displayed.   Lipid Panel: Recent Labs    11/20/19 1203 01/12/20 1356 05/31/20 1354  CHOL  --  150 167  HDL  --  62 61  LDLCALC  --  73 85  TRIG 91 66 117  CHOLHDL  --  2.4 2.7   No results found for: HGBA1C  Procedures since last visit: No results found.  Assessment/Plan  COPD (chronic obstructive pulmonary disease) with emphysema ([place]) DOE, uses Anoro, saw [employer] 05/29/20. Recent pleural effusion, fluid was exudative, negative cytology while treated PNA. CT chest extensive emphysema, large left pleural effusion, had thoracentesis. COPD, started rescue inhaler Albuterol prn, , graded physical activity, no O2 supplement needed.    BPH (benign prostatic hyperplasia) No urinary retention, continue Vesicare, Flomax.   Anxiety Continue Librium.   Kidney disease, chronic, stage III (GFR 30-59 ml/min) Creat 1.73, eGFR 35 05/31/20, worsened from prior creat 1.2-1.3, repeat BMP/eGFR  Hyperlipidemia LDL at goal, continue statin   Labs/tests ordered:  BMP/eGFR  Next appt: f/u Dr. Lyndel Safe 3 months.

## 2020-06-05 NOTE — Assessment & Plan Note (Signed)
LDL at goal, continue statin. 

## 2020-06-05 NOTE — Assessment & Plan Note (Signed)
No urinary retention, continue Vesicare, Flomax.

## 2020-06-07 ENCOUNTER — Other Ambulatory Visit: Payer: Self-pay

## 2020-06-11 ENCOUNTER — Other Ambulatory Visit: Payer: Medicare Other

## 2020-06-18 ENCOUNTER — Other Ambulatory Visit: Payer: Medicare Other

## 2020-06-19 ENCOUNTER — Other Ambulatory Visit: Payer: Self-pay

## 2020-06-19 ENCOUNTER — Other Ambulatory Visit: Payer: Medicare Other

## 2020-06-19 DIAGNOSIS — N1832 Chronic kidney disease, stage 3b: Secondary | ICD-10-CM

## 2020-06-19 LAB — BASIC METABOLIC PANEL WITH GFR
BUN/Creatinine Ratio: 18 (calc) (ref 6–22)
BUN: 26 mg/dL — ABNORMAL HIGH (ref 7–25)
CO2: 28 mmol/L (ref 20–32)
Calcium: 8.4 mg/dL — ABNORMAL LOW (ref 8.6–10.3)
Chloride: 105 mmol/L (ref 98–110)
Creat: 1.45 mg/dL — ABNORMAL HIGH (ref 0.70–1.11)
GFR, Est African American: 50 mL/min/{1.73_m2} — ABNORMAL LOW (ref >=60)
GFR, Est Non African American: 43 mL/min/{1.73_m2} — ABNORMAL LOW (ref >=60)
Glucose, Bld: 120 mg/dL — ABNORMAL HIGH (ref 65–99)
Potassium: 4.3 mmol/L (ref 3.5–5.3)
Sodium: 140 mmol/L (ref 135–146)

## 2020-07-02 ENCOUNTER — Encounter (INDEPENDENT_AMBULATORY_CARE_PROVIDER_SITE_OTHER): Payer: Medicare Other | Admitting: Ophthalmology

## 2020-07-12 ENCOUNTER — Ambulatory Visit (INDEPENDENT_AMBULATORY_CARE_PROVIDER_SITE_OTHER): Payer: Medicare Other | Admitting: Ophthalmology

## 2020-07-12 ENCOUNTER — Other Ambulatory Visit: Payer: Self-pay

## 2020-07-12 ENCOUNTER — Encounter (INDEPENDENT_AMBULATORY_CARE_PROVIDER_SITE_OTHER): Payer: Self-pay | Admitting: Ophthalmology

## 2020-07-12 DIAGNOSIS — H35371 Puckering of macula, right eye: Secondary | ICD-10-CM | POA: Diagnosis not present

## 2020-07-12 DIAGNOSIS — H353114 Nonexudative age-related macular degeneration, right eye, advanced atrophic with subfoveal involvement: Secondary | ICD-10-CM | POA: Diagnosis not present

## 2020-07-12 DIAGNOSIS — H353211 Exudative age-related macular degeneration, right eye, with active choroidal neovascularization: Secondary | ICD-10-CM | POA: Diagnosis not present

## 2020-07-12 DIAGNOSIS — H353212 Exudative age-related macular degeneration, right eye, with inactive choroidal neovascularization: Secondary | ICD-10-CM

## 2020-07-12 DIAGNOSIS — H353122 Nonexudative age-related macular degeneration, left eye, intermediate dry stage: Secondary | ICD-10-CM

## 2020-07-12 NOTE — Assessment & Plan Note (Signed)

## 2020-07-12 NOTE — Patient Instructions (Signed)
Patient to report any new sudden distortions or changes in acuity in either eye

## 2020-07-12 NOTE — Assessment & Plan Note (Signed)
The nature of macular pucker (epiretinal membrane ERM) was discussed with the patient as well as threshold criteria for vitrectomy surgery. I explained that in rare cases another surgery is needed to actually remove a second wrinkle should it regrow.  Most often, the epiretinal membrane and underlying wrinkled internal limiting membrane are removed with the first surgery, to accomplish the goals.   If the operative eye is [place] (natural lens still present), cataract surgery is often recommended prior to Vitrectomy. This will enable the retina surgeon to have the best view during surgery and the patient to obtain optimal results in the future. Treatment options were discussed.  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

## 2020-07-12 NOTE — Progress Notes (Signed)
07/12/2020     CHIEF COMPLAINT Patient presents for Retina Follow Up   HISTORY OF PRESENT ILLNESS: Jesse [name] is a 86 y.o. male who presents to the clinic today for:   HPI    Retina Follow Up    Patient presents with  Dry AMD.  In both eyes.  This started 7 months ago.  Severity is mild.  Duration of 7 months.  Since onset it is gradually worsening.          Comments    7 Month AMD F/U OU  Pt c/o decreased DVA OS when looking at road signs. Pt denies changes OD. Pt denies any other new symptoms OU.       Last edited by Rockie Neighbours, [place] on 07/12/2020  1:55 PM. (History)      Referring physician: Virgie Dad, MD [place],  [place] [phone]  HISTORICAL INFORMATION:   Selected notes from the MEDICAL RECORD NUMBER       CURRENT MEDICATIONS: No current outpatient medications on file. (Ophthalmic Drugs)   No current facility-administered medications for this visit. (Ophthalmic Drugs)   Current Outpatient Medications (Other)  Medication Sig  . acetaminophen (TYLENOL) 325 MG tablet Take 325 mg by mouth every 6 (six) hours as needed (for pain).  Marland Kitchen albuterol (VENTOLIN HFA) 108 (90 Base) MCG/ACT inhaler Inhale 2 puffs into the lungs every 6 (six) hours as needed. (Patient not taking: Reported on 06/05/2020)  . chlordiazePOXIDE (LIBRIUM) 10 MG capsule Take 10 mg by mouth daily.  . Cholecalciferol (VITAMIN D3) 1000 units CAPS Take 1,000 Units by mouth daily.  . Multiple Vitamins-Minerals (PRESERVISION AREDS 2 PO) Take 1 tablet by mouth in the morning and at bedtime.  . pravastatin (PRAVACHOL) 40 MG tablet Take 40 mg by mouth daily. In the morning.  . saccharomyces boulardii (FLORASTOR) 250 MG capsule Take 250 mg by mouth 2 (two) times daily.  . solifenacin (VESICARE) 10 MG tablet Take 10 mg by mouth daily. In the morning.  . tamsulosin (FLOMAX) 0.4 MG CAPS capsule Take 0.4 mg by mouth daily.  Marland Kitchen umeclidinium-vilanterol (ANORO ELLIPTA) 62.5-25 MCG/INH  AEPB Inhale 1 puff into the lungs daily.  . vitamin B-12 (CYANOCOBALAMIN) 1000 MCG tablet Take 1,000 mcg by mouth daily.   No current facility-administered medications for this visit. (Other)      REVIEW OF SYSTEMS:    ALLERGIES Allergies  Allergen Reactions  . Adhesive [Tape] Other (See Comments)    Tears skin off PAPER TAPE IS OKAY    PAST MEDICAL HISTORY Past Medical History:  Diagnosis Date  . Anxiety   . Asthma    as child  . Bladder cancer [facility]) urologist-  dr Elliot Gault [facility] in [place], [place])   dx 01/ 2016--- post TURBT and post Bladder bx 02-10-2017  . Bladder tumor   . BPH (benign prostatic hyperplasia)   . Frequency of urination   . History of asthma    childhood  . History of external beam radiation therapy    2002  prostate/ pelvis and boost w/ radioactive prostate seed implants   . History of hypercalcemia    s/p  parathyroidectomy 2003  . History of prostate cancer current urologist-  dr Elliot Gault [facility] in [place], [place])-- per lov note (care everywhere) last PSA undetectable   dx 2002--  urologist-- dr dahlstedt/ oncologist dr Valere Dross---  Gleason 7 out of 10, PSA 4.9---post Radioactive Prostate Seed Implant and External Beam  Radiation therapy  . History of skin cancer   . Hyperlipidemia   . Multiple lung nodules    since 2006--- followed by pcp --- dr Maudie Mercury  . Renal cyst, left   . Wears glasses   . Wears hearing aid in both ears    Past Surgical History:  Procedure Laterality Date  . CARDIOVASCULAR STRESS TEST  01/01/2004   normal nuclear perfustion study w/ no ischemia/  normal LV function and wall motion, ef 65%  . CATARACT EXTRACTION W/ INTRAOCULAR LENS  IMPLANT, BILATERAL  2010  . COLONOSCOPY    . CYSTO/  BILATERAL RETROGRADE PYELOGRAM/  BLADDER BX'S AND FULGERATION  02-10-2017   dr Anabel Bene [facility] in [place], [place]  . CYSTOSCOPY W/ RETROGRADES Bilateral 07/27/2017   Procedure: CYSTOSCOPY,SELECTIVE  CYTOLOGIES,RETROGRADE PYELOGRAM;  Surgeon: Cleon Gustin, MD;  Location: [facility];  Service: Urology;  Laterality: Bilateral;  . CYSTOSCOPY W/ RETROGRADES Bilateral 12/08/2017   Procedure: CYSTOSCOPY WITH RETROGRADE PYELOGRAM;  Surgeon: Cleon Gustin, MD;  Location[facility];  Service: Urology;  Laterality: Bilateral;  . CYSTOSCOPY WITH BIOPSY N/A 07/27/2017   Procedure: CYSTOSCOPY WITH BIOPSY OF BLADDER AND PROSTATIC URETHRA;  Surgeon: Cleon Gustin, MD;  Location: [facility];  Service: Urology;  Laterality: N/A;  . CYSTOSCOPY WITH BIOPSY Bilateral 12/08/2017   Procedure: CYSTOSCOPY WITH RENAL WASHINGS;  Surgeon: Cleon Gustin, MD;  Location: [facility];  Service: Urology;  Laterality: Bilateral;  . FIBEROPTIC BRONCHOSCOPY  08-20-2005   dr wert   w/ Left lower lobe bx  . PARATHYROIDECTOMY  2003  . RADIOACTIVE PROSTATE SEED IMPLANTS  04-05-2001    dr Diona Fanti [facility]  . TONSILLECTOMY  child  . TRANSURETHRAL RESECTION OF BLADDER TUMOR  11-24-2014   dr Anabel Bene [facility] in [place], [place]  . TRANSURETHRAL RESECTION OF BLADDER TUMOR N/A 04/08/2018   Procedure: TRANSURETHRAL RESECTION OF BLADDER TUMOR (TURBT);  Surgeon: Cleon Gustin, MD;  Location: [facility];  Service: Urology;  Laterality: N/A;  . TRANSURETHRAL RESECTION OF PROSTATE N/A 02/28/2019   Procedure: TRANSURETHRAL RESECTION OF THE PROSTATE (TURP);  Surgeon: Cleon Gustin, MD;  Location: [facility];  Service: Urology;  Laterality: N/A;  30 MINS  . UPPER GI ENDOSCOPY      FAMILY HISTORY History reviewed. No pertinent family history.  SOCIAL HISTORY Social History   Tobacco Use  . Smoking status: Former Smoker    Years: 50.00    Types: Cigarettes    Quit date: 11/03/1984    Years since quitting: 35.7  . Smokeless tobacco: Never Used  Vaping Use  . Vaping Use: Never used  Substance Use Topics  .  Alcohol use: Yes    Alcohol/week: 7.0 standard drinks    Types: 7 Glasses of wine per week    Comment: daily wine  . Drug use: No         OPHTHALMIC EXAM: Base Eye Exam    Visual Acuity (ETDRS)      Right Left   Dist cc CF @ 6' 20/50 +2   Dist ph cc 20/200 -1 20/40   Correction: Glasses       Tonometry (Tonopen, 1:58 PM)      Right Left   Pressure 10 11       Pupils      Pupils Dark Light Shape React APD   Right PERRL 4 3 Round Sluggish None   Left PERRL 4 3 Round  Sluggish None       Visual Fields (Counting fingers)      Left Right    Full Full       Extraocular Movement      Right Left    Full Full       Neuro/Psych    Oriented x3: Yes   Mood/Affect: Normal       Dilation    Both eyes: 1.0% Mydriacyl, 2.5% Phenylephrine @ 2:01 PM        Slit Lamp and Fundus Exam    Slit Lamp Exam      Right Left   Lids/Lashes Normal Normal   Conjunctiva/Sclera White and quiet White and quiet   Cornea Clear Clear   Anterior Chamber Deep and quiet Deep and quiet   Iris Round and reactive Round and reactive   Lens Posterior chamber intraocular lens Posterior chamber intraocular lens   Anterior Vitreous Normal Normal       Fundus Exam      Right Left   Posterior Vitreous Posterior vitreous detachment Posterior vitreous detachment   Disc Normal Normal   C/D Ratio 0.3 0.3   Macula Disciform scar, Pigmented atrophy Hard drusen, no exudates, no hemorrhage, no macular thickening   Vessels Normal Normal   Periphery Normal Normal          IMAGING AND PROCEDURES  Imaging and Procedures for 07/12/20  OCT, Retina - OU - Both Eyes       Right Eye Quality was good. Scan locations included subfoveal. Central Foveal Thickness: 474. Findings include epiretinal membrane, cystoid macular edema, subretinal scarring, disciform scar.   Left Eye Quality was good. Scan locations included subfoveal. Central Foveal Thickness: 250.   Notes OD with central CME, persistent  with severe epiretinal membrane.  Nonetheless outer retinal scarring precludes any attempted surgery at this point in the right eye for epiretinal membrane removal.  OS No signs of active CNVM OS                ASSESSMENT/PLAN:  Right epiretinal membrane The nature of macular pucker (epiretinal membrane ERM) was discussed with the patient as well as threshold criteria for vitrectomy surgery. I explained that in rare cases another surgery is needed to actually remove a second wrinkle should it regrow.  Most often, the epiretinal membrane and underlying wrinkled internal limiting membrane are removed with the first surgery, to accomplish the goals.   If the operative eye is [place] (natural lens still present), cataract surgery is often recommended prior to Vitrectomy. This will enable the retina surgeon to have the best view during surgery and the patient to obtain optimal results in the future. Treatment options were discussed.  Not the cause OD for vision loss, not an operable condition given the underlying loss of acuity from CNVM of disciform scar  Exudative age-related macular degeneration of right eye with inactive choroidal neovascularization (HCC) Currently stable OD  Advanced nonexudative age-related macular degeneration of right eye with subfoveal involvement The nature of dry age related macular degeneration was discussed with the patient as well as its possible conversion to wet. The results of the AREDS 2 study was discussed with the patient. A diet rich in dark leafy green vegetables was advised and specific recommendations were made regarding supplements with AREDS 2 formulation . Control of hypertension and serum cholesterol may slow the disease. Smoking cessation is mandatory to slow the disease and diminish the risk of progressing to wet age related macular degeneration. The  patient was instructed in the use of an [place] and was told to return immediately for any changes  in the Grid. Stressed to the patient do not rub eyes      ICD-10-CM   1. Exudative age-related macular degeneration of right eye with inactive choroidal neovascularization (HCC)  H35.3212 OCT, Retina - OU - Both Eyes  2. Exudative age-related macular degeneration of right eye with active choroidal neovascularization ([employer])  H35.3211   3. Right epiretinal membrane  H35.371 OCT, Retina - OU - Both Eyes  4. Advanced nonexudative age-related macular degeneration of right eye with subfoveal involvement  H35.3114   5. Intermediate stage nonexudative age-related macular degeneration of left eye  H35.3122     1.  OS continues to function nicely with no signs of wet AMD patient reassured there are no worrisome features today  2.  We will continue to monitor OD for changes  3.  Ophthalmic Meds Ordered this visit:  No orders of the defined types were placed in this encounter.      Return in about 6 months (around 01/09/2021) for DILATE OU, OCT.  There are no Patient Instructions on file for this visit.   Explained the diagnoses, plan, and follow up with the patient and they expressed understanding.  Patient expressed understanding of the importance of proper follow up care.   Clent Demark Marcelline Temkin M.D. Diseases & Surgery of the Retina and Vitreous Retina [facility] Diabetic [place] 07/12/20     Abbreviations: M myopia (nearsighted); A astigmatism; H hyperopia (farsighted); P presbyopia; Mrx spectacle prescription;  CTL contact lenses; OD right eye; OS left eye; OU both eyes  XT exotropia; ET esotropia; PEK punctate epithelial keratitis; PEE punctate epithelial erosions; DES dry eye syndrome; MGD meibomian gland dysfunction; ATs artificial tears; PFAT's preservative free artificial tears; [place] nuclear sclerotic cataract; PSC posterior subcapsular cataract; ERM epi-retinal membrane; PVD posterior vitreous detachment; RD retinal detachment; DM diabetes mellitus; DR diabetic retinopathy; NPDR  non-proliferative diabetic retinopathy; PDR proliferative diabetic retinopathy; CSME clinically significant macular edema; DME diabetic macular edema; dbh dot blot hemorrhages; CWS cotton wool spot; POAG primary open angle glaucoma; C/D cup-to-disc ratio; HVF humphrey visual field; GVF goldmann visual field; OCT optical coherence tomography; IOP intraocular pressure; BRVO Branch retinal vein occlusion; CRVO central retinal vein occlusion; CRAO central retinal artery occlusion; BRAO branch retinal artery occlusion; RT retinal tear; SB scleral buckle; PPV pars plana vitrectomy; VH Vitreous hemorrhage; PRP panretinal laser photocoagulation; IVK intravitreal kenalog; VMT vitreomacular traction; MH Macular hole;  NVD neovascularization of the disc; NVE neovascularization elsewhere; AREDS age related eye disease study; ARMD age related macular degeneration; POAG primary open angle glaucoma; EBMD epithelial/anterior basement membrane dystrophy; ACIOL anterior chamber intraocular lens; IOL intraocular lens; PCIOL posterior chamber intraocular lens; Phaco/IOL phacoemulsification with intraocular lens placement; [place] photorefractive keratectomy; LASIK laser assisted in situ keratomileusis; HTN hypertension; DM diabetes mellitus; COPD chronic obstructive pulmonary disease

## 2020-07-12 NOTE — Assessment & Plan Note (Signed)
Currently stable OD

## 2020-08-18 ENCOUNTER — Other Ambulatory Visit: Payer: Self-pay | Admitting: Internal Medicine

## 2020-08-20 NOTE — Telephone Encounter (Signed)
Pharmacy requested refill Pended Rx and sent to Dr. Gupta for approval.  

## 2020-10-03 ENCOUNTER — Encounter: Payer: Medicare Other | Admitting: Internal Medicine

## 2020-10-03 ENCOUNTER — Other Ambulatory Visit: Payer: Self-pay

## 2020-10-31 ENCOUNTER — Encounter: Payer: Self-pay | Admitting: Internal Medicine

## 2020-10-31 ENCOUNTER — Non-Acute Institutional Stay: Payer: Medicare Other | Admitting: Internal Medicine

## 2020-10-31 ENCOUNTER — Other Ambulatory Visit: Payer: Self-pay

## 2020-10-31 VITALS — BP 110/60 | HR 80 | Temp 97.9°F | Ht 68.0 in | Wt 119.8 lb

## 2020-10-31 DIAGNOSIS — F419 Anxiety disorder, unspecified: Secondary | ICD-10-CM | POA: Diagnosis not present

## 2020-10-31 DIAGNOSIS — C689 Malignant neoplasm of urinary organ, unspecified: Secondary | ICD-10-CM | POA: Diagnosis not present

## 2020-10-31 DIAGNOSIS — N1832 Chronic kidney disease, stage 3b: Secondary | ICD-10-CM

## 2020-10-31 DIAGNOSIS — E785 Hyperlipidemia, unspecified: Secondary | ICD-10-CM

## 2020-10-31 DIAGNOSIS — N400 Enlarged prostate without lower urinary tract symptoms: Secondary | ICD-10-CM | POA: Diagnosis not present

## 2020-10-31 DIAGNOSIS — J42 Unspecified chronic bronchitis: Secondary | ICD-10-CM | POA: Diagnosis not present

## 2020-11-01 ENCOUNTER — Encounter: Payer: Self-pay | Admitting: Internal Medicine

## 2020-11-01 NOTE — Progress Notes (Signed)
Location:  [place] of Service:  Clinic (12)  Provider:   Code Status:  Goals of Care:  Advanced Directives 05/03/2020  Does Patient Have a Medical Advance Directive? No  Type of Advance Directive -  Does patient want to make changes to medical advance directive? -  Copy of [place] in Chart? -  Would patient like information on creating a medical advance directive? No - Patient declined     Chief Complaint  Patient presents with  . Medical Management of Chronic Issues    Patient returns to the clinic for follow Up. He would like to discuss his upcoming surgery.     HPI: Patient is a 86 y.o. male seen today for medical management of chronic diseases.    Patient has PMH  Covid Pneumonia BPH and h/o Bladder cancer, HLD and Depression and COPD Also was admitted to Hospital from 6/30-7/2 for CAP and Pleural Effusion  Patient recently had CT Scan ordered by Urology and it shows multifocal Urothelial Cancer. He has been having lower abdominal pain and frequency for past few months. Patient has not discussed these finding with Dr Beatrix Fetters yet. Continues to micturate every 2 hours to relieve his pain  COPD and S/P Covid infection in 1/21 Follows with Pulmonology Symptoms are better. 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 Mood is stable Appetite is good and has started gaining weight  Past Medical History:  Diagnosis Date  . Anxiety   . Asthma    as child  . Bladder cancer [facility]) urologist-  dr Elliot Gault [facility] in [place], [place])   dx 01/ 2016--- post TURBT and post Bladder bx 02-10-2017  . Bladder tumor   . BPH (benign prostatic hyperplasia)   . Frequency of urination   . History of asthma    childhood  . History of external beam radiation therapy    2002  prostate/ pelvis and boost w/ radioactive prostate seed implants   . History of  hypercalcemia    s/p  parathyroidectomy 2003  . History of prostate cancer current urologist-  dr Elliot Gault [facility] in [place], [place])-- per lov note (care everywhere) last PSA undetectable   dx 2002--  urologist-- dr dahlstedt/ oncologist dr Valere Dross---  Gleason 7 out of 10, PSA 4.9---post Radioactive Prostate Seed Implant and External Beam Radiation therapy  . History of skin cancer   . Hyperlipidemia   . Multiple lung nodules    since 2006--- followed by pcp --- dr Maudie Mercury  . Renal cyst, left   . Wears glasses   . Wears hearing aid in both ears     Past Surgical History:  Procedure Laterality Date  . CARDIOVASCULAR STRESS TEST  01/01/2004   normal nuclear perfustion study w/ no ischemia/  normal LV function and wall motion, ef 65%  . CATARACT EXTRACTION W/ INTRAOCULAR LENS  IMPLANT, BILATERAL  2010  . COLONOSCOPY    . CYSTO/  BILATERAL RETROGRADE PYELOGRAM/  BLADDER BX'S AND FULGERATION  02-10-2017   dr Anabel Bene [facility] in [place], [place]  . CYSTOSCOPY W/ RETROGRADES Bilateral 07/27/2017   Procedure: CYSTOSCOPY,SELECTIVE CYTOLOGIES,RETROGRADE PYELOGRAM;  Surgeon: Cleon Gustin, MD;  Location: [facility];  Service: Urology;  Laterality: Bilateral;  . CYSTOSCOPY W/ RETROGRADES Bilateral 12/08/2017   Procedure: CYSTOSCOPY WITH RETROGRADE PYELOGRAM;  Surgeon: Cleon Gustin, MD;  Location: [facility];  Service: Urology;  Laterality: Bilateral;  . CYSTOSCOPY WITH BIOPSY N/A 07/27/2017   Procedure: CYSTOSCOPY WITH BIOPSY OF BLADDER AND PROSTATIC URETHRA;  Surgeon: Cleon Gustin, MD;  Location: [facility];  Service: Urology;  Laterality: N/A;  . CYSTOSCOPY WITH BIOPSY Bilateral 12/08/2017   Procedure: CYSTOSCOPY WITH RENAL WASHINGS;  Surgeon: Cleon Gustin, MD;  Location[facility];  Service: Urology;  Laterality: Bilateral;  . FIBEROPTIC BRONCHOSCOPY  08-20-2005   dr wert   w/ Left lower lobe bx  .  PARATHYROIDECTOMY  2003  . RADIOACTIVE PROSTATE SEED IMPLANTS  04-05-2001    dr Diona Fanti [facility]  . TONSILLECTOMY  child  . TRANSURETHRAL RESECTION OF BLADDER TUMOR  11-24-2014   dr Anabel Bene [facility] in [place], [place]  . TRANSURETHRAL RESECTION OF BLADDER TUMOR N/A 04/08/2018   Procedure: TRANSURETHRAL RESECTION OF BLADDER TUMOR (TURBT);  Surgeon: Cleon Gustin, MD;  Location: [facility];  Service: Urology;  Laterality: N/A;  . TRANSURETHRAL RESECTION OF PROSTATE N/A 02/28/2019   Procedure: TRANSURETHRAL RESECTION OF THE PROSTATE (TURP);  Surgeon: Cleon Gustin, MD;  Location: [facility];  Service: Urology;  Laterality: N/A;  30 MINS  . UPPER GI ENDOSCOPY      Allergies  Allergen Reactions  . Adhesive [Tape] Other (See Comments)    Tears skin off PAPER TAPE IS OKAY    Outpatient Encounter Medications as of 10/31/2020  Medication Sig  . chlordiazePOXIDE (LIBRIUM) 10 MG capsule TAKE 1 CAPSULE BY MOUTH THREE TIMES DAILY AS NEEDED  . acetaminophen (TYLENOL) 325 MG tablet Take 325 mg by mouth every 6 (six) hours as needed (for pain).  Marland Kitchen albuterol (VENTOLIN HFA) 108 (90 Base) MCG/ACT inhaler Inhale 2 puffs into the lungs every 6 (six) hours as needed. (Patient not taking: Reported on 06/05/2020)  . Cholecalciferol (VITAMIN D3) 1000 units CAPS Take 1,000 Units by mouth daily.  . pravastatin (PRAVACHOL) 40 MG tablet Take 40 mg by mouth daily. In the morning.  . solifenacin (VESICARE) 10 MG tablet Take 10 mg by mouth daily. In the morning.  . tamsulosin (FLOMAX) 0.4 MG CAPS capsule Take 0.4 mg by mouth daily.  Marland Kitchen umeclidinium-vilanterol (ANORO ELLIPTA) 62.5-25 MCG/INH AEPB Inhale 1 puff into the lungs daily.  . [DISCONTINUED] Multiple Vitamins-Minerals (PRESERVISION AREDS 2 PO) Take 1 tablet by mouth in the morning and at bedtime.  . [DISCONTINUED] saccharomyces boulardii (FLORASTOR) 250 MG capsule Take 250 mg by mouth 2 (two) times daily.  .  [DISCONTINUED] vitamin B-12 (CYANOCOBALAMIN) 1000 MCG tablet Take 1,000 mcg by mouth daily.   No facility-administered encounter medications on file as of 10/31/2020.    Review of Systems:  Review of Systems  Review of Systems  Constitutional: Negative for activity change, appetite change, chills, diaphoresis, fatigue and fever.  HENT: Negative for mouth sores, postnasal drip, rhinorrhea, sinus pain and sore throat.   Respiratory: Negative for apnea, cough, chest tightness, shortness of breath and wheezing.   Cardiovascular: Negative for chest pain, palpitations and leg swelling.  Gastrointestinal: Negative for abdominal distention, abdominal pain, constipation, diarrhea, nausea and vomiting.  Genitourinary: Negative for dysuria  Musculoskeletal: Negative for arthralgias, joint swelling and myalgias.  Skin: Negative for rash.  Neurological: Negative for dizziness, syncope, weakness, light-headedness and numbness.  Psychiatric/Behavioral: Negative for behavioral problems, confusion and sleep disturbance.     Health Maintenance  Topic Date Due  . TETANUS/TDAP  Never done  . PNA vac Low Risk Adult (2 of 2 -  PPSV23) 05/19/2018  . INFLUENZA VACCINE  Completed  . COVID-19 Vaccine  Completed    Physical Exam: Vitals:   10/31/20 1323  BP: 110/60  Pulse: 80  Temp: 97.9 F (36.6 C)  SpO2: 98%  Weight: 119 lb 12.8 oz (54.3 kg)  Height: 5\' 8"  (1.727 m)   Body mass index is 18.22 kg/m. Physical Exam  Constitutional: Oriented to person, place, and time. Well-developed and well-nourished.  HENT:  Head: Normocephalic.  Mouth/Throat: Oropharynx is clear and moist.  Eyes: Pupils are equal, round, and reactive to light.  Neck: Neck supple.  Cardiovascular: Normal rate and normal heart sounds.  No murmur heard. Pulmonary/Chest: Effort normal and breath sounds normal. No respiratory distress. No wheezes. She has no rales.  Abdominal: Soft. Bowel sounds are normal. No distension. There  is no tenderness. There is no rebound.  Musculoskeletal: No edema.  Lymphadenopathy: none Neurological: Alert and oriented to person, place, and time.  Skin: Skin is warm and dry.  Psychiatric: Normal mood and affect. Behavior is normal. Thought content normal.    Labs reviewed: Basic Metabolic Panel: Recent Labs    11/24/19 0235 11/25/19 0104 01/12/20 1356 04/12/20 1401 05/04/20 0340 05/31/20 1354 06/19/20 1349  NA 141   < > 138   < > 142 139 140  K 3.7   < > 4.3   < > 3.4* 4.0 4.3  CL 106   < > 106   < > 111 102 105  CO2 26   < > 25   < > 23 24 28   GLUCOSE 104*   < > 94   < > 96 83 120*  BUN 42*   < > 20   < > 19 25 26*  CREATININE 0.81   < > 1.00   < > 1.13 1.73* 1.45*  CALCIUM 8.3*   < > 8.8   < > 7.6* 9.0 8.4*  MG 2.4  --   --   --   --   --   --   TSH  --   --  1.46  --   --   --   --    < > = values in this interval not displayed.   Liver Function Tests: Recent Labs    11/24/19 0235 11/25/19 0104 11/30/19 0000 01/12/20 1356 04/12/20 1401 05/03/20 0337 05/31/20 1354  AST 39 37 29   < > 18 26 15   ALT 66* 64* 56*   < > 23 42 11  ALKPHOS 45 49 39  --   --  69  --   BILITOT 0.9 0.7  --    < > 0.3 0.7 0.5  PROT 5.2* 5.5*  --    < > 5.9* 5.1* 6.3  ALBUMIN 2.7* 2.9*  --   --   --  2.3*  --    < > = values in this interval not displayed.   No results for input(s): LIPASE, AMYLASE in the last 8760 hours. No results for input(s): AMMONIA in the last 8760 hours. CBC: Recent Labs    01/12/20 1356 04/12/20 1401 05/02/20 1858 05/03/20 0337 05/04/20 0340 05/31/20 1354  WBC 7.7 8.3   < > 12.4* 7.6 7.5  NEUTROABS 3,688 5,071  --   --   --  4,035  HGB 12.9* 13.5   < > 10.9* 10.7* 12.6*  HCT 38.2* 40.1   < > 33.1* 32.8* 38.6  MCV 92.3 89.5   < > 88.0 88.2 88.1  PLT 245 319   < > 332 309 258   < > = values in this interval not displayed.   Lipid Panel: Recent Labs    11/20/19 1203 01/12/20 1356 05/31/20 1354  CHOL  --  150 167  HDL  --  62 61  LDLCALC  --   73 85  TRIG 91 66 117  CHOLHDL  --  2.4 2.7   No results found for: HGBA1C  Procedures since last visit: No results found.  Assessment/Plan Chronic bronchitis, unspecified chronic bronchitis type (HCC) Stable on Anoro Follows with Pulmonary PRN ? Urothelial cancer (HCC) per imaging. Detail Eval pending Discussed in detail  He is going to go through eval in few weeks Possible surgery  Benign prostatic hyperplasia with lower urinary tract symptoms On Vesicare and Flomax Anxiety Stable on Librium Stage 3b chronic kidney disease (HCC) Creat stable Hyperlipidemia, unspecified hyperlipidemia type Continue Statin      Labs/tests ordered:  * No order type specified * Next appt:  03/21/2021

## 2020-11-12 ENCOUNTER — Other Ambulatory Visit: Payer: Self-pay | Admitting: Urology

## 2020-11-14 ENCOUNTER — Non-Acute Institutional Stay: Payer: Medicare Other | Admitting: Internal Medicine

## 2020-11-14 ENCOUNTER — Other Ambulatory Visit: Payer: Self-pay

## 2020-11-14 ENCOUNTER — Encounter: Payer: Self-pay | Admitting: Internal Medicine

## 2020-11-14 VITALS — BP 94/60 | HR 89 | Temp 96.9°F | Ht 68.0 in | Wt 116.4 lb

## 2020-11-14 DIAGNOSIS — R5381 Other malaise: Secondary | ICD-10-CM

## 2020-11-14 DIAGNOSIS — N1832 Chronic kidney disease, stage 3b: Secondary | ICD-10-CM | POA: Diagnosis not present

## 2020-11-14 DIAGNOSIS — C689 Malignant neoplasm of urinary organ, unspecified: Secondary | ICD-10-CM

## 2020-11-14 DIAGNOSIS — Z01818 Encounter for other preprocedural examination: Secondary | ICD-10-CM | POA: Diagnosis not present

## 2020-11-14 DIAGNOSIS — J42 Unspecified chronic bronchitis: Secondary | ICD-10-CM

## 2020-11-14 NOTE — Progress Notes (Signed)
Location: [place] of Service:  Clinic (12)  Provider:   Code Status:  Goals of Care:  Advanced Directives 05/03/2020  Does Patient Have a Medical Advance Directive? No  Type of Advance Directive -  Does patient want to make changes to medical advance directive? -  Copy of [place] in Chart? -  Would patient like information on creating a medical advance directive? No - Patient declined     Chief Complaint  Patient presents with  . Acute Visit    Surgical Clearance    HPI: Patient is a 87 y.o. male seen today for an acute visit for Surgery Clearance per Urology  Patient hasPMHCovid Pneumonia BPH and h/o Bladder cancer, HLD and Depression and COPD Also was admitted to Hospital from 6/30-7/2 forCAP and Pleural Effusion  Patient recently had CT Scan ordered by Urology and it shows multifocal Urothelial Cancer. He has been having lower abdominal pain, Hematuria  and frequency for past few months  Had Foley cathter placed by urology.  Patient states that he is having pain and bladder spasms.  No hematuria. Plan is for him to undergo Trans Urethral Resection with possible stent placement and biopsy.  And Targeted Chemo  Patient has a history of COPD.  Mild shortness of breath on exertion.  Not on oxygen.  Uses inhalers as needed. No history of chest pain no cardiac history. Does continue to be deconditioned and gets tired easily.   Past Medical History:  Diagnosis Date  . Anxiety   . Asthma    as child  . Bladder cancer [facility]) urologist-  dr Elliot Gault [facility] in [place], [place])   dx 01/ 2016--- post TURBT and post Bladder bx 02-10-2017  . Bladder tumor   . BPH (benign prostatic hyperplasia)   . Frequency of urination   . History of asthma    childhood  . History of external beam radiation therapy    2002  prostate/ pelvis and boost w/ radioactive prostate seed implants   . History of hypercalcemia    s/p   parathyroidectomy 2003  . History of prostate cancer current urologist-  dr Elliot Gault [facility] in [place], [place])-- per lov note (care everywhere) last PSA undetectable   dx 2002--  urologist-- dr dahlstedt/ oncologist dr Valere Dross---  Gleason 7 out of 10, PSA 4.9---post Radioactive Prostate Seed Implant and External Beam Radiation therapy  . History of skin cancer   . Hyperlipidemia   . Multiple lung nodules    since 2006--- followed by pcp --- dr Maudie Mercury  . Renal cyst, left   . Wears glasses   . Wears hearing aid in both ears     Past Surgical History:  Procedure Laterality Date  . CARDIOVASCULAR STRESS TEST  01/01/2004   normal nuclear perfustion study w/ no ischemia/  normal LV function and wall motion, ef 65%  . CATARACT EXTRACTION W/ INTRAOCULAR LENS  IMPLANT, BILATERAL  2010  . COLONOSCOPY    . CYSTO/  BILATERAL RETROGRADE PYELOGRAM/  BLADDER BX'S AND FULGERATION  02-10-2017   dr Anabel Bene [facility] in [place], [place]  . CYSTOSCOPY W/ RETROGRADES Bilateral 07/27/2017   Procedure: CYSTOSCOPY,SELECTIVE CYTOLOGIES,RETROGRADE PYELOGRAM;  Surgeon: Cleon Gustin, MD;  Location: [facility];  Service: Urology;  Laterality: Bilateral;  . CYSTOSCOPY W/ RETROGRADES Bilateral 12/08/2017   Procedure: CYSTOSCOPY WITH RETROGRADE PYELOGRAM;  Surgeon: Cleon Gustin, MD;  Location: [facility];  Service: Urology;  Laterality:  Bilateral;  . CYSTOSCOPY WITH BIOPSY N/A 07/27/2017   Procedure: CYSTOSCOPY WITH BIOPSY OF BLADDER AND PROSTATIC URETHRA;  Surgeon: Malen Gauze, MD;  Location: [facility];  Service: Urology;  Laterality: N/A;  . CYSTOSCOPY WITH BIOPSY Bilateral 12/08/2017   Procedure: CYSTOSCOPY WITH RENAL WASHINGS;  Surgeon: Malen Gauze, MD;  Location: [facility];  Service: Urology;  Laterality: Bilateral;  . FIBEROPTIC BRONCHOSCOPY  08-20-2005   dr wert   w/ Left lower lobe bx  . PARATHYROIDECTOMY  2003  .  RADIOACTIVE PROSTATE SEED IMPLANTS  04-05-2001    dr Retta Diones [facility]  . TONSILLECTOMY  child  . TRANSURETHRAL RESECTION OF BLADDER TUMOR  11-24-2014   dr Melida Quitter [facility] in [place], [place]  . TRANSURETHRAL RESECTION OF BLADDER TUMOR N/A 04/08/2018   Procedure: TRANSURETHRAL RESECTION OF BLADDER TUMOR (TURBT);  Surgeon: Malen Gauze, MD;  Location: [facility];  Service: Urology;  Laterality: N/A;  . TRANSURETHRAL RESECTION OF PROSTATE N/A 02/28/2019   Procedure: TRANSURETHRAL RESECTION OF THE PROSTATE (TURP);  Surgeon: Malen Gauze, MD;  Location: [facility];  Service: Urology;  Laterality: N/A;  30 MINS  . UPPER GI ENDOSCOPY      Allergies  Allergen Reactions  . Adhesive [Tape] Other (See Comments)    Tears skin off PAPER TAPE IS OKAY    Outpatient Encounter Medications as of 11/14/2020  Medication Sig  . acetaminophen (TYLENOL) 325 MG tablet Take 325 mg by mouth every 6 (six) hours as needed (for pain).  Marland Kitchen albuterol (VENTOLIN HFA) 108 (90 Base) MCG/ACT inhaler Inhale 2 puffs into the lungs every 6 (six) hours as needed. (Patient taking differently: Inhale 2 puffs into the lungs every 6 (six) hours as needed for shortness of breath or wheezing.)  . chlordiazePOXIDE (LIBRIUM) 10 MG capsule TAKE 1 CAPSULE BY MOUTH THREE TIMES DAILY AS NEEDED (Patient taking differently: Take 10 mg by mouth daily.)  . Cholecalciferol (VITAMIN D3) 1000 units CAPS Take 1,000 Units by mouth daily.  . pravastatin (PRAVACHOL) 40 MG tablet Take 40 mg by mouth daily. In the morning.  . solifenacin (VESICARE) 10 MG tablet Take 10 mg by mouth daily. In the morning.  . tamsulosin (FLOMAX) 0.4 MG CAPS capsule Take 0.4 mg by mouth daily.  Marland Kitchen umeclidinium-vilanterol (ANORO ELLIPTA) 62.5-25 MCG/INH AEPB Inhale 1 puff into the lungs daily. (Patient taking differently: Inhale 1 puff into the lungs daily as needed (SOB).)   No facility-administered encounter medications on  file as of 11/14/2020.    Review of Systems:  Review of Systems  Review of Systems  Constitutional: Negative for activity change, appetite change, chills, diaphoresis, fatigue and fever.  HENT: Negative for mouth sores, postnasal drip, rhinorrhea, sinus pain and sore throat.   Respiratory: Negative for apnea, cough, chest tightness, shortness of breath and wheezing.   Cardiovascular: Negative for chest pain, palpitations and leg swelling.  Gastrointestinal: Negative for abdominal distention, abdominal pain, constipation, diarrhea, nausea and vomiting.  Genitourinary: positive for dysuria and frequency.  Musculoskeletal: Negative for arthralgias, joint swelling and myalgias.  Skin: Negative for rash.  Neurological: Negative for dizziness, syncope, weakness, light-headedness and numbness.  Psychiatric/Behavioral: Negative for behavioral problems, confusion and sleep disturbance.     Health Maintenance  Topic Date Due  . TETANUS/TDAP  Never done  . COVID-19 Vaccine (4 - Booster for Moderna series) 03/17/2021  . INFLUENZA VACCINE  Completed  . PNA vac Low Risk Adult  Completed  Physical Exam: Vitals:   11/14/20 1619 11/14/20 1625  BP: 94/60   Pulse: (!) 115 89  Temp: (!) 96.9 F (36.1 C)   SpO2: 99%   Weight: 116 lb 6.4 oz (52.8 kg)   Height: 5\' 8"  (1.727 m)    Body mass index is 17.7 kg/m. Physical Exam  Constitutional: Oriented to person, place, and time. Well-developed and well-nourished.  HENT:  Head: Normocephalic.  Mouth/Throat: Oropharynx is clear and moist.  Eyes: Pupils are equal, round, and reactive to light.  Neck: Neck supple.  Cardiovascular: Normal rate and normal heart sounds.  No murmur heard. Pulmonary/Chest: Effort normal and breath sounds normal. No respiratory distress. No wheezes.  has no rales.  Abdominal: Soft. Bowel sounds are normal. No distension. There is no tenderness. There is no rebound.  Musculoskeletal: No edema.  Lymphadenopathy:  none Neurological: Alert and oriented to person, place, and time.  Skin: Skin is warm and dry.  Psychiatric: Normal mood and affect. Behavior is normal. Thought content normal.    Labs reviewed: Basic Metabolic Panel: Recent Labs    11/24/19 0235 11/25/19 0104 01/12/20 1356 04/12/20 1401 05/04/20 0340 05/31/20 1354 06/19/20 1349  NA 141   < > 138   < > 142 139 140  K 3.7   < > 4.3   < > 3.4* 4.0 4.3  CL 106   < > 106   < > 111 102 105  CO2 26   < > 25   < > 23 24 28   GLUCOSE 104*   < > 94   < > 96 83 120*  BUN 42*   < > 20   < > 19 25 26*  CREATININE 0.81   < > 1.00   < > 1.13 1.73* 1.45*  CALCIUM 8.3*   < > 8.8   < > 7.6* 9.0 8.4*  MG 2.4  --   --   --   --   --   --   TSH  --   --  1.46  --   --   --   --    < > = values in this interval not displayed.   Liver Function Tests: Recent Labs    11/24/19 0235 11/25/19 0104 11/30/19 0000 01/12/20 1356 04/12/20 1401 05/03/20 0337 05/31/20 1354  AST 39 37 29   < > 18 26 15   ALT 66* 64* 56*   < > 23 42 11  ALKPHOS 45 49 39  --   --  69  --   BILITOT 0.9 0.7  --    < > 0.3 0.7 0.5  PROT 5.2* 5.5*  --    < > 5.9* 5.1* 6.3  ALBUMIN 2.7* 2.9*  --   --   --  2.3*  --    < > = values in this interval not displayed.   No results for input(s): LIPASE, AMYLASE in the last 8760 hours. No results for input(s): AMMONIA in the last 8760 hours. CBC: Recent Labs    01/12/20 1356 04/12/20 1401 05/02/20 1858 05/03/20 0337 05/04/20 0340 05/31/20 1354  WBC 7.7 8.3   < > 12.4* 7.6 7.5  NEUTROABS 3,688 5,071  --   --   --  4,035  HGB 12.9* 13.5   < > 10.9* 10.7* 12.6*  HCT 38.2* 40.1   < > 33.1* 32.8* 38.6  MCV 92.3 89.5   < > 88.0 88.2 88.1  PLT 245 319   < >  332 309 258   < > = values in this interval not displayed.   Lipid Panel: Recent Labs    11/20/19 1203 01/12/20 1356 05/31/20 1354  CHOL  --  150 167  HDL  --  62 61  LDLCALC  --  73 85  TRIG 91 66 117  CHOLHDL  --  2.4 2.7   No results found for:  HGBA1C  Procedures since last visit: No results found.  Assessment/Plan Preop exam  Patient is stable with no active Cardiac issues He is also stable for his COPD.No Exacerbation recently Not on any Anticoagulation  Physical deconditioning Does get tired easily  Will Hire Help in his apartment after surgery  Stage 3b chronic kidney disease ([place]) Creat is stable Blood work per Urology  Chronic bronchitis, unspecified chronic bronchitis type ([place]) No Oxygen requirement Stable on ANoro PRN  Hyperlipidemia, unspecified hyperlipidemia type Continue Statin Anxiety Stable on Librium   Labs/tests ordered:  * No order type specified * Next appt:  03/21/2021

## 2020-11-18 ENCOUNTER — Other Ambulatory Visit: Payer: Self-pay

## 2020-11-18 ENCOUNTER — Emergency Department (HOSPITAL_COMMUNITY)
Admission: EM | Admit: 2020-11-18 | Discharge: 2020-11-18 | Disposition: A | Discharge status: Home / Self Care | Payer: Medicare Other | Attending: Emergency Medicine | Admitting: Emergency Medicine

## 2020-11-18 ENCOUNTER — Encounter (HOSPITAL_COMMUNITY): Payer: Self-pay | Admitting: Obstetrics and Gynecology

## 2020-11-18 DIAGNOSIS — Z8546 Personal history of malignant neoplasm of prostate: Secondary | ICD-10-CM | POA: Insufficient documentation

## 2020-11-18 DIAGNOSIS — R310 Gross hematuria: Secondary | ICD-10-CM | POA: Insufficient documentation

## 2020-11-18 DIAGNOSIS — J45909 Unspecified asthma, uncomplicated: Secondary | ICD-10-CM | POA: Diagnosis not present

## 2020-11-18 DIAGNOSIS — N183 Chronic kidney disease, stage 3 unspecified: Secondary | ICD-10-CM | POA: Insufficient documentation

## 2020-11-18 DIAGNOSIS — Z85828 Personal history of other malignant neoplasm of skin: Secondary | ICD-10-CM | POA: Insufficient documentation

## 2020-11-18 DIAGNOSIS — J449 Chronic obstructive pulmonary disease, unspecified: Secondary | ICD-10-CM | POA: Diagnosis not present

## 2020-11-18 DIAGNOSIS — Z87891 Personal history of nicotine dependence: Secondary | ICD-10-CM | POA: Insufficient documentation

## 2020-11-18 DIAGNOSIS — Z8551 Personal history of malignant neoplasm of bladder: Secondary | ICD-10-CM | POA: Insufficient documentation

## 2020-11-18 DIAGNOSIS — Z7951 Long term (current) use of inhaled steroids: Secondary | ICD-10-CM | POA: Diagnosis not present

## 2020-11-18 DIAGNOSIS — R319 Hematuria, unspecified: Secondary | ICD-10-CM | POA: Diagnosis present

## 2020-11-18 LAB — CBC WITH DIFFERENTIAL/PLATELET
Abs Immature Granulocytes: 0.03 10*3/uL (ref 0.00–0.07)
Basophils Absolute: 0 10*3/uL (ref 0.0–0.1)
Basophils Relative: 0 %
Eosinophils Absolute: 0 10*3/uL (ref 0.0–0.5)
Eosinophils Relative: 0 %
HCT: 41 % (ref 39.0–52.0)
Hemoglobin: 13.6 g/dL (ref 13.0–17.0)
Immature Granulocytes: 1 %
Lymphocytes Relative: 26 %
Lymphs Abs: 1.8 10*3/uL (ref 0.7–4.0)
MCH: 31.1 pg (ref 26.0–34.0)
MCHC: 33.2 g/dL (ref 30.0–36.0)
MCV: 93.8 fL (ref 80.0–100.0)
Monocytes Absolute: 0.5 10*3/uL (ref 0.1–1.0)
Monocytes Relative: 8 %
Neutro Abs: 4.3 10*3/uL (ref 1.7–7.7)
Neutrophils Relative %: 65 %
Platelets: 193 10*3/uL (ref 150–400)
RBC: 4.37 MIL/uL (ref 4.22–5.81)
RDW: 14.9 % (ref 11.5–15.5)
WBC: 6.6 10*3/uL (ref 4.0–10.5)
nRBC: 0 % (ref 0.0–0.2)

## 2020-11-18 LAB — COMPREHENSIVE METABOLIC PANEL
ALT: 24 U/L (ref 0–44)
AST: 23 U/L (ref 15–41)
Albumin: 4.3 g/dL (ref 3.5–5.0)
Alkaline Phosphatase: 71 U/L (ref 38–126)
Anion gap: 7 (ref 5–15)
BUN: 24 mg/dL — ABNORMAL HIGH (ref 8–23)
CO2: 30 mmol/L (ref 22–32)
Calcium: 8.8 mg/dL — ABNORMAL LOW (ref 8.9–10.3)
Chloride: 104 mmol/L (ref 98–111)
Creatinine, Ser: 1.74 mg/dL — ABNORMAL HIGH (ref 0.61–1.24)
GFR, Estimated: 38 mL/min — ABNORMAL LOW (ref >60)
Glucose, Bld: 108 mg/dL — ABNORMAL HIGH (ref 70–99)
Potassium: 4.2 mmol/L (ref 3.5–5.1)
Sodium: 141 mmol/L (ref 135–145)
Total Bilirubin: 0.7 mg/dL (ref 0.3–1.2)
Total Protein: 6.9 g/dL (ref 6.5–8.1)

## 2020-11-18 LAB — URINALYSIS, MICROSCOPIC (REFLEX): RBC / HPF: >50 RBC/hpf (ref 0–5)

## 2020-11-18 LAB — URINALYSIS, ROUTINE W REFLEX MICROSCOPIC

## 2020-11-18 NOTE — ED Triage Notes (Signed)
Patient reports to the ER for blood in catheter. Patient reports he noted some blood in his catheter this morning and he has had the cath x1 week. Patient reports some abdominal pain as well.

## 2020-11-18 NOTE — ED Notes (Addendum)
Patient educated on irrigating catheter. Reports he has supplies at home.

## 2020-11-18 NOTE — ED Notes (Signed)
Patient insists on leaving independently despite hazardous conditions. Patient is alert and oriented and able to make his own decisions. Primary nurse made aware.

## 2020-11-18 NOTE — Discharge Instructions (Addendum)
You been seen here for blood in your urine.  Lab work and exam look reassuring.  I recommend continuing with your medications as prescribed.  If you develop pain in your abdomen and have been unable to urinate you can try flushing out your Foley catheter as directed by your urologist.  I want you to follow-up with your urologist in the next few days for further evaluation.  Come back to the emergency department if you develop chest pain, shortness of breath, severe abdominal pain, uncontrolled nausea, vomiting, diarrhea.

## 2020-11-18 NOTE — ED Notes (Addendum)
Patient foley flushed with 547ml. Initially, return was dark red with clots. By the end of irrigation, return was lighter red without clots. Leg bag applied to measure further output.

## 2020-11-18 NOTE — ED Provider Notes (Signed)
[facility] DEPT Provider Note   CSN: [id number] Arrival date & time: 11/18/20  1104     History Chief Complaint  Patient presents with  . Urinary Retention    Blood in catheter    Jesse [name] is a 87 y.o. male.  HPI   Patient with significant medical history of prostate cancer, bladder cancer, anxiety, asthma presents to the emergency department with chief complaint of hematuria.  Patient states this started this morning around 3 AM, he noted a lot of blood within his Foley bag.  Patient states he has had the Foley cath in for the last 6 days, generally he sees a little bit of blood in his bag but today he see a lot more and is concerned that he might be bleeding out.  He denies associated symptoms, denies urinary symptoms, denies penile pain, testicular pain, abdominal pain, flank pain, nausea, vomiting, diarrhea, fevers or chills.  He denies recent fatigue, chest pain or shortness of breath, he is currently not on anticoagulants.  Denies  alleviating factors.  After reviewing patient's chart he is scheduled for transurethral resection with Dr. Diona Fanti on January 24, he has had multiple ED visits for hematuria, most recently  had a cystoscopy performed back in May 2021 by Dr. Alyson Ingles found that he had large clot within the bladder causing him to have urinary retention.  Past Medical History:  Diagnosis Date  . Anxiety   . Asthma    as child  . Bladder cancer [facility]) urologist-  dr Elliot Gault [facility] in [place], [place])   dx 01/ 2016--- post TURBT and post Bladder bx 02-10-2017  . Bladder tumor   . BPH (benign prostatic hyperplasia)   . Frequency of urination   . History of asthma    childhood  . History of external beam radiation therapy    2002  prostate/ pelvis and boost w/ radioactive prostate seed implants   . History of hypercalcemia    s/p  parathyroidectomy 2003  . History of prostate cancer current urologist-  dr Elliot Gault [facility] in [place], [place])-- per lov note (care everywhere) last PSA undetectable   dx 2002--  urologist-- dr dahlstedt/ oncologist dr Valere Dross---  Gleason 7 out of 10, PSA 4.9---post Radioactive Prostate Seed Implant and External Beam Radiation therapy  . History of skin cancer   . Hyperlipidemia   . Multiple lung nodules    since 2006--- followed by pcp --- dr Maudie Mercury  . Renal cyst, left   . Wears glasses   . Wears hearing aid in both ears     Patient Active Problem List   Diagnosis Date Noted  . Exudative age-related macular degeneration of right eye with inactive choroidal neovascularization ([place]) 07/12/2020  . Right epiretinal membrane 07/12/2020  . Advanced nonexudative age-related macular degeneration of right eye with subfoveal involvement 07/12/2020  . Intermediate stage nonexudative age-related macular degeneration of left eye 07/12/2020  . COPD (chronic obstructive pulmonary disease) with emphysema ([place]) 06/05/2020  . Hyperlipidemia 06/05/2020  . Pneumonia 05/02/2020  . Pleural effusion 05/02/2020  . Pericardial effusion 05/02/2020  . Kidney disease, chronic, stage [name] (GFR 30-59 ml/min) ([place]) 05/02/2020  . Chest pain 04/10/2020  . Hypotension 11/28/2019  . Anxiety 11/28/2019  . Poor appetite 11/28/2019  . Acute respiratory failure due to COVID-19 ([place]) 11/20/2019  . BPH (benign prostatic hyperplasia) 11/20/2019  . History of prostate cancer 11/20/2019  . Acute respiratory failure ([place]) 11/20/2019    Past  Surgical History:  Procedure Laterality Date  . CARDIOVASCULAR STRESS TEST  01/01/2004   normal nuclear perfustion study w/ no ischemia/  normal LV function and wall motion, ef 65%  . CATARACT EXTRACTION W/ INTRAOCULAR LENS  IMPLANT, BILATERAL  2010  . COLONOSCOPY    . CYSTO/  BILATERAL RETROGRADE PYELOGRAM/  BLADDER BX'S AND FULGERATION  02-10-2017   dr Anabel Bene [facility] in [place], [place]  . CYSTOSCOPY W/ RETROGRADES Bilateral 07/27/2017   Procedure:  CYSTOSCOPY,SELECTIVE CYTOLOGIES,RETROGRADE PYELOGRAM;  Surgeon: Cleon Gustin, MD;  Location: [facility];  Service: Urology;  Laterality: Bilateral;  . CYSTOSCOPY W/ RETROGRADES Bilateral 12/08/2017   Procedure: CYSTOSCOPY WITH RETROGRADE PYELOGRAM;  Surgeon: Cleon Gustin, MD;  Location: [facility];  Service: Urology;  Laterality: Bilateral;  . CYSTOSCOPY WITH BIOPSY N/A 07/27/2017   Procedure: CYSTOSCOPY WITH BIOPSY OF BLADDER AND PROSTATIC URETHRA;  Surgeon: Cleon Gustin, MD;  Location: [facility];  Service: Urology;  Laterality: N/A;  . CYSTOSCOPY WITH BIOPSY Bilateral 12/08/2017   Procedure: CYSTOSCOPY WITH RENAL WASHINGS;  Surgeon: Cleon Gustin, MD;  [facility];  Service: Urology;  Laterality: Bilateral;  . FIBEROPTIC BRONCHOSCOPY  08-20-2005   dr wert   w/ Left lower lobe bx  . PARATHYROIDECTOMY  2003  . RADIOACTIVE PROSTATE SEED IMPLANTS  04-05-2001    dr Diona Fanti [facility]  . TONSILLECTOMY  child  . TRANSURETHRAL RESECTION OF BLADDER TUMOR  11-24-2014   dr Anabel Bene [facility] in [place], [place]  . TRANSURETHRAL RESECTION OF BLADDER TUMOR N/A 04/08/2018   Procedure: TRANSURETHRAL RESECTION OF BLADDER TUMOR (TURBT);  Surgeon: Cleon Gustin, MD;  Location: [facility];  Service: Urology;  Laterality: N/A;  . TRANSURETHRAL RESECTION OF PROSTATE N/A 02/28/2019   Procedure: TRANSURETHRAL RESECTION OF THE PROSTATE (TURP);  Surgeon: Cleon Gustin, MD;  Location: [facility];  Service: Urology;  Laterality: N/A;  30 MINS  . UPPER GI ENDOSCOPY         No family history on file.  Social History   Tobacco Use  . Smoking status: Former Smoker    Years: 50.00    Types: Cigarettes    Quit date: 11/03/1984    Years since quitting: 36.0  . Smokeless tobacco: Never Used  Vaping Use  . Vaping Use: Never used  Substance Use Topics  . Alcohol use: Yes     Alcohol/week: 7.0 standard drinks    Types: 7 Glasses of wine per week    Comment: daily wine  . Drug use: No    Home Medications Prior to Admission medications   Medication Sig Start Date End Date Taking? Authorizing Provider  acetaminophen (TYLENOL) 325 MG tablet Take 325 mg by mouth every 6 (six) hours as needed (for pain).    [provider]  albuterol (VENTOLIN HFA) 108 (90 Base) MCG/ACT inhaler Inhale 2 puffs into the lungs every 6 (six) hours as needed. Patient taking differently: Inhale 2 puffs into the lungs every 6 (six) hours as needed for shortness of breath or wheezing. 05/29/20   Sherrilyn Rist A, MD  chlordiazePOXIDE (LIBRIUM) 10 MG capsule TAKE 1 CAPSULE BY MOUTH THREE TIMES DAILY AS NEEDED Patient taking differently: Take 10 mg by mouth daily. 08/20/20   Virgie Dad, MD  Cholecalciferol (VITAMIN D3) 1000 units CAPS Take 1,000 Units by mouth daily.    [provider]  pravastatin (PRAVACHOL) 40 MG tablet Take 40 mg by mouth daily.  In the morning. 09/30/14   [provider]  solifenacin (VESICARE) 10 MG tablet Take 10 mg by mouth daily. In the morning.    [provider]  tamsulosin (FLOMAX) 0.4 MG CAPS capsule Take 0.4 mg by mouth daily.    [provider]  umeclidinium-vilanterol (ANORO ELLIPTA) 62.5-25 MCG/INH AEPB Inhale 1 puff into the lungs daily. Patient taking differently: Inhale 1 puff into the lungs daily as needed (SOB). 05/29/20   Laurin Coder, MD    Allergies    Adhesive [tape]  Review of Systems   Review of Systems  Constitutional: Negative for chills and fever.  HENT: Negative for congestion.   Respiratory: Negative for shortness of breath.   Cardiovascular: Negative for chest pain.  Gastrointestinal: Negative for abdominal pain, constipation, diarrhea and vomiting.  Genitourinary: Positive for hematuria. Negative for dysuria, enuresis and flank pain.  Musculoskeletal: Negative for back pain.  Skin:  Negative for rash.  Neurological: Negative for dizziness and headaches.  Hematological: Does not bruise/bleed easily.    Physical Exam Updated Vital Signs BP 120/73 (BP Location: Left Arm)   Pulse (!) 107   Temp 97.8 F (36.6 C) (Oral)   Resp 20   SpO2 95%   Physical Exam Vitals and nursing note reviewed.  Constitutional:      General: He is not in acute distress.    Appearance: He is not ill-appearing.  HENT:     Head: Normocephalic and atraumatic.     Nose: No congestion.  Eyes:     Conjunctiva/sclera: Conjunctivae normal.  Cardiovascular:     Rate and Rhythm: Normal rate and regular rhythm.     Pulses: Normal pulses.     Heart sounds: No murmur heard. No friction rub. No gallop.   Pulmonary:     Effort: No respiratory distress.     Breath sounds: No wheezing, rhonchi or rales.  Abdominal:     Palpations: Abdomen is soft.     Tenderness: There is no abdominal tenderness.  Genitourinary:    Penis: Normal.      Comments: Patient has a Foley catheter in place, no active drainage or discharge present, there is some noted blood at the distal end of his penis.  Patient's Foley bag contains large amount of blood in it, no signs of infection present.  Appears to be actively draining. Skin:    General: Skin is warm and dry.  Neurological:     Mental Status: He is alert.  Psychiatric:        Mood and Affect: Mood normal.     ED Results / Procedures / Treatments   Labs (all labs ordered are listed, but only abnormal results are displayed) Labs Reviewed  COMPREHENSIVE METABOLIC PANEL - Abnormal; Notable for the following components:      Result Value   Glucose, Bld 108 (*)    BUN 24 (*)    Creatinine, Ser 1.74 (*)    Calcium 8.8 (*)    GFR, Estimated 38 (*)    All other components within normal limits  URINALYSIS, ROUTINE W REFLEX MICROSCOPIC - Abnormal; Notable for the following components:   Color, Urine RED (*)    APPearance TURBID (*)    Glucose, UA   (*)     Value: TEST NOT REPORTED DUE TO COLOR INTERFERENCE OF URINE PIGMENT   Hgb urine dipstick   (*)    Value: TEST NOT REPORTED DUE TO COLOR INTERFERENCE OF URINE PIGMENT   Bilirubin Urine   (*)  Value: TEST NOT REPORTED DUE TO COLOR INTERFERENCE OF URINE PIGMENT   Ketones, ur   (*)    Value: TEST NOT REPORTED DUE TO COLOR INTERFERENCE OF URINE PIGMENT   Protein, ur   (*)    Value: TEST NOT REPORTED DUE TO COLOR INTERFERENCE OF URINE PIGMENT   Nitrite   (*)    Value: TEST NOT REPORTED DUE TO COLOR INTERFERENCE OF URINE PIGMENT   Leukocytes,Ua   (*)    Value: TEST NOT REPORTED DUE TO COLOR INTERFERENCE OF URINE PIGMENT   All other components within normal limits  URINALYSIS, MICROSCOPIC (REFLEX) - Abnormal; Notable for the following components:   Bacteria, UA RARE (*)    All other components within normal limits  CBC WITH DIFFERENTIAL/PLATELET    EKG None  Radiology No results found.  Procedures Procedures (including critical care time)  Medications Ordered in ED Medications - No data to display  ED Course  I have reviewed the triage vital signs and the nursing notes.  Pertinent labs & imaging results that were available during my care of the patient were reviewed by me and considered in my medical decision making (see chart for details).    MDM Rules/Calculators/A&P                          Patient presents emerged part chief complaint of hematuria.  He is alert, does not appear in acute distress, vital signs reassuring.  Will obtain basic lab work, bladder scan and reevaluate.  Nursing staff was able to flush Foley catheter with 500 cc of fluids, few clots came out, continuing to drain without difficulty.  CBC negative for leukocytosis or signs anemia.  CMP shows no electrolyte abnormalities, shows slight increase in BUN, creatinine, no liver enzymes, no anion gap present.  UA shows many red blood cells, few white blood cells, rare bacteria.  Bladder scan was obtained shows  30 cc within the bladder.  I have low suspicion for anemia secondary due to acute bleed as there is no signs of anemia on exam, patient denies fatigue, shortness of breath, chest pain.  Low suspicion for systemic infection as patient is nontoxic-appearing, vital signs reassuring, no obvious source infection on my exam.  Low suspicion for UTI or pyelonephritis as patient has no CVA tenderness on exam, denies urinary symptoms, UA is unremarkable for infection.  I suspect blood is secondary due to irritation due to catheter or progression of bladder cancer.  Low suspicion for hydronephrosis as bladder scan was unremarkable, nontender in the pelvic region, catheter is draining without difficulty.  Suspect patient's hematuria is due to irritation within the urethra from Foley or from progression of bladder cancer.  Will recommend he follows up with his urologist for further evaluation.  Vital signs have remained stable, no indication for hospital admission.  Patient discussed with attending and they agreed with assessment and plan.  Patient given at home care as well strict return precautions.  Patient verbalized that they understood agreed to said plan.    Final Clinical Impression(s) / ED Diagnoses Final diagnoses:  Gross hematuria    Rx / DC Orders ED Discharge Orders    None       Marcello Fennel, PA-C 11/18/20 1526    Dorie Rank, MD 11/19/20 [phone]

## 2020-11-19 NOTE — Patient Instructions (Signed)
DUE TO COVID-19 ONLY ONE VISITOR IS ALLOWED TO COME WITH YOU AND STAY IN THE WAITING ROOM ONLY DURING PRE OP AND PROCEDURE DAY OF SURGERY. THE 1 VISITOR  MAY VISIT WITH YOU AFTER SURGERY IN YOUR PRIVATE ROOM DURING VISITING HOURS ONLY!  YOU NEED TO HAVE A COVID 19 TEST ON: 11/22/20 @ 2:00 PM , THIS TEST MUST BE DONE BEFORE SURGERY,  COVID TESTING SITE [place] [address], IT IS ON THE RIGHT GOING OUT [address] APPROXIMATELY  2 MINUTES PAST [employer] ON THE RIGHT. ONCE YOUR COVID TEST IS COMPLETED,  PLEASE BEGIN THE QUARANTINE INSTRUCTIONS AS OUTLINED IN YOUR HANDOUT.                Jesse [name]    Your procedure is scheduled on: 11/26/20    Report to [facility] Main  Entrance    Report to admitting at: 10:30 AM    Call this number if you have problems the morning of surgery [phone]    Remember: Do not eat solid food: After Midnight. Clear liquids until: 9:30 am.  CLEAR LIQUID DIET  Foods Allowed                                                                     Foods Excluded  Coffee and tea, regular and decaf                             liquids that you cannot  Plain Jell-O any favor except red or purple                                           see through such as: Fruit ices (not with fruit pulp)                                     milk, soups, orange juice  Iced Popsicles                                    All solid food Carbonated beverages, regular and diet                                    Cranberry, grape and apple juices Sports drinks like Gatorade Lightly seasoned clear broth or consume(fat free) Sugar, honey syrup  Sample Menu Breakfast                                Lunch                                     Supper Cranberry juice  Beef broth                            Chicken broth Jell-O                                     Grape juice                           Apple juice Coffee or tea                         Jell-O                                      Popsicle                                                Coffee or tea                        Coffee or tea  _____________________________________________________________________  BRUSH YOUR TEETH MORNING OF SURGERY AND RINSE YOUR MOUTH OUT, NO CHEWING GUM CANDY OR MINTS.    Take these medicines the morning of surgery with A SIP OF WATER: tamsulosin,vesicare,librium.Use inhalers as usual                               You may not have any metal on your body including hair pins and              piercings  Do not wear jewelry, lotions, powders or perfumes, deodorant             Men may shave face and neck.   Do not bring valuables to the hospital. [place].  Contacts, dentures or bridgework may not be worn into surgery.  Leave suitcase in the car. After surgery it may be brought to your room.     Patients discharged the day of surgery will not be allowed to drive home. IF YOU ARE HAVING SURGERY AND GOING HOME THE SAME DAY, YOU MUST HAVE AN ADULT TO DRIVE YOU HOME AND BE WITH YOU FOR 24 HOURS. YOU MAY GO HOME BY TAXI OR UBER OR ORTHERWISE, BUT AN ADULT MUST ACCOMPANY YOU HOME AND STAY WITH YOU FOR 24 HOURS.  Name and phone number of your driver:  Special Instructions: N/A              Please read over the following fact sheets you were given: _____________________________________________________________________         [facility] - Preparing for Surgery Before surgery, you can play an important role.  Because skin is not sterile, your skin needs to be as free of germs as possible.  You can reduce the number of germs on your skin by washing with CHG (chlorahexidine gluconate) soap before surgery.  CHG is an antiseptic cleaner which kills germs and bonds with the skin to continue killing  germs even after washing. Please DO NOT use if you have an allergy to CHG or antibacterial  soaps.  If your skin becomes reddened/irritated stop using the CHG and inform your nurse when you arrive at Short Stay. Do not shave (including legs and underarms) for at least 48 hours prior to the first CHG shower.  You may shave your face/neck. Please follow these instructions carefully:  1.  Shower with CHG Soap the night before surgery and the  morning of Surgery.  2.  If you choose to wash your hair, wash your hair first as usual with your  normal  shampoo.  3.  After you shampoo, rinse your hair and body thoroughly to remove the  shampoo.                           4.  Use CHG as you would any other liquid soap.  You can apply chg directly  to the skin and wash                       Gently with a scrungie or clean washcloth.  5.  Apply the CHG Soap to your body ONLY FROM THE NECK DOWN.   Do not use on face/ open                           Wound or open sores. Avoid contact with eyes, ears mouth and genitals (private parts).                       Wash face,  Genitals (private parts) with your normal soap.             6.  Wash thoroughly, paying special attention to the area where your surgery  will be performed.  7.  Thoroughly rinse your body with warm water from the neck down.  8.  DO NOT shower/wash with your normal soap after using and rinsing off  the CHG Soap.                9.  Pat yourself dry with a clean towel.            10.  Wear clean pajamas.            11.  Place clean sheets on your bed the night of your first shower and do not  sleep with pets. Day of Surgery : Do not apply any lotions/deodorants the morning of surgery.  Please wear clean clothes to the hospital/surgery center.  FAILURE TO FOLLOW THESE INSTRUCTIONS MAY RESULT IN THE CANCELLATION OF YOUR SURGERY PATIENT SIGNATURE_________________________________  NURSE SIGNATURE__________________________________  ________________________________________________________________________

## 2020-11-20 ENCOUNTER — Encounter (HOSPITAL_COMMUNITY)
Admission: RE | Admit: 2020-11-20 | Discharge: 2020-11-20 | Disposition: A | Discharge status: Home / Self Care | Payer: Medicare Other | Source: Ambulatory Visit | Attending: Urology | Admitting: Urology

## 2020-11-20 ENCOUNTER — Other Ambulatory Visit: Payer: Self-pay

## 2020-11-20 ENCOUNTER — Encounter (HOSPITAL_COMMUNITY): Payer: Self-pay

## 2020-11-20 DIAGNOSIS — Z01818 Encounter for other preprocedural examination: Secondary | ICD-10-CM | POA: Insufficient documentation

## 2020-11-20 NOTE — Progress Notes (Addendum)
COVID Vaccine Completed: Yes Date COVID Vaccine completed: 09/17/20 COVID vaccine manufacturer:  Moderna    PCP - Dr. Veleta Miners. : Clearance on: 11/14/20. : Epic Cardiologist - No  Chest x-ray -  EKG -  Stress Test -  ECHO -  Cardiac Cath -  Pacemaker/ICD device last checked:  Sleep Study -  CPAP -   Fasting Blood Sugar -  Checks Blood Sugar _____ times a day  Blood Thinner Instructions: Aspirin Instructions: Last Dose:  Anesthesia review: ED visit on 11/18/20.  Patient denies shortness of breath, fever, cough and chest pain at PAT appointment   Patient verbalized understanding of instructions that were given to them at the PAT appointment. Patient was also instructed that they will need to review over the PAT instructions again at home before surgery.

## 2020-11-22 ENCOUNTER — Other Ambulatory Visit (HOSPITAL_COMMUNITY)
Admission: RE | Admit: 2020-11-22 | Discharge: 2020-11-22 | Disposition: A | Discharge status: Home / Self Care | Payer: Medicare Other | Source: Ambulatory Visit | Attending: Urology | Admitting: Urology

## 2020-11-22 DIAGNOSIS — Z01812 Encounter for preprocedural laboratory examination: Secondary | ICD-10-CM | POA: Diagnosis present

## 2020-11-22 DIAGNOSIS — Z20822 Contact with and (suspected) exposure to covid-19: Secondary | ICD-10-CM | POA: Diagnosis not present

## 2020-11-23 LAB — SARS CORONAVIRUS 2 (TAT 6-24 HRS): SARS Coronavirus 2: NEGATIVE

## 2020-11-26 ENCOUNTER — Ambulatory Visit (HOSPITAL_COMMUNITY): Payer: Medicare Other | Admitting: Registered Nurse

## 2020-11-26 ENCOUNTER — Observation Stay (HOSPITAL_COMMUNITY)
Admission: RE | Admit: 2020-11-26 | Discharge: 2020-11-28 | Disposition: A | Discharge status: Home / Self Care | Payer: Medicare Other | Attending: Urology | Admitting: Urology

## 2020-11-26 ENCOUNTER — Ambulatory Visit (HOSPITAL_COMMUNITY): Payer: Medicare Other

## 2020-11-26 ENCOUNTER — Encounter (HOSPITAL_COMMUNITY): Payer: Self-pay | Admitting: Urology

## 2020-11-26 ENCOUNTER — Encounter (HOSPITAL_COMMUNITY)
Admission: RE | Disposition: A | Discharge status: Home / Self Care | Payer: Self-pay | Source: Home / Self Care | Attending: Urology

## 2020-11-26 ENCOUNTER — Other Ambulatory Visit: Payer: Self-pay

## 2020-11-26 DIAGNOSIS — C678 Malignant neoplasm of overlapping sites of bladder: Secondary | ICD-10-CM | POA: Diagnosis present

## 2020-11-26 DIAGNOSIS — C680 Malignant neoplasm of urethra: Secondary | ICD-10-CM | POA: Insufficient documentation

## 2020-11-26 DIAGNOSIS — Z87891 Personal history of nicotine dependence: Secondary | ICD-10-CM | POA: Insufficient documentation

## 2020-11-26 DIAGNOSIS — Z85828 Personal history of other malignant neoplasm of skin: Secondary | ICD-10-CM | POA: Insufficient documentation

## 2020-11-26 DIAGNOSIS — Z8546 Personal history of malignant neoplasm of prostate: Secondary | ICD-10-CM | POA: Diagnosis not present

## 2020-11-26 DIAGNOSIS — J45909 Unspecified asthma, uncomplicated: Secondary | ICD-10-CM | POA: Diagnosis not present

## 2020-11-26 HISTORY — PX: CYSTOSCOPY WITH RETROGRADE PYELOGRAM, URETEROSCOPY AND STENT PLACEMENT: SHX5789

## 2020-11-26 SURGERY — CYSTOURETEROSCOPY, WITH RETROGRADE PYELOGRAM AND STENT INSERTION
Anesthesia: General

## 2020-11-26 MED ORDER — ONDANSETRON HCL 4 MG/2ML IJ SOLN
INTRAMUSCULAR | Status: AC
  Filled 2020-11-26: qty 2

## 2020-11-26 MED ORDER — HYDROCODONE-ACETAMINOPHEN 5-325 MG PO TABS
ORAL_TABLET | ORAL | Status: AC
  Filled 2020-11-26: qty 2

## 2020-11-26 MED ORDER — SENNA 8.6 MG PO TABS
1.0000 | ORAL_TABLET | Freq: Two times a day (BID) | ORAL | Status: DC
  Administered 2020-11-27 – 2020-11-28 (×3): 8.6 mg via ORAL
  Filled 2020-11-26 (×4): qty 1

## 2020-11-26 MED ORDER — FENTANYL CITRATE (PF) 100 MCG/2ML IJ SOLN
INTRAMUSCULAR | Status: AC
  Filled 2020-11-26: qty 2

## 2020-11-26 MED ORDER — SODIUM CHLORIDE 0.9 % IR SOLN
Status: DC | PRN
  Administered 2020-11-26: 3000 mL

## 2020-11-26 MED ORDER — ONDANSETRON HCL 4 MG/2ML IJ SOLN: 4.0000 mg | Freq: Four times a day (QID) | INTRAMUSCULAR | Status: DC | PRN

## 2020-11-26 MED ORDER — TAMSULOSIN HCL 0.4 MG PO CAPS
0.4000 mg | ORAL_CAPSULE | Freq: Every day | ORAL | Status: DC
  Administered 2020-11-27 – 2020-11-28 (×2): 0.4 mg via ORAL
  Filled 2020-11-26 (×2): qty 1

## 2020-11-26 MED ORDER — CHLORHEXIDINE GLUCONATE 0.12 % MT SOLN
15.0000 mL | Freq: Once | OROMUCOSAL | Status: AC
  Administered 2020-11-26: 15 mL via OROMUCOSAL

## 2020-11-26 MED ORDER — SULFAMETHOXAZOLE-TRIMETHOPRIM 400-80 MG PO TABS
1.0000 | ORAL_TABLET | Freq: Two times a day (BID) | ORAL | Status: DC
  Administered 2020-11-26 – 2020-11-28 (×4): 1 via ORAL
  Filled 2020-11-26 (×4): qty 1

## 2020-11-26 MED ORDER — VITAMIN D 25 MCG (1000 UNIT) PO TABS
1000.0000 [IU] | ORAL_TABLET | Freq: Every day | ORAL | Status: DC
  Administered 2020-11-27 – 2020-11-28 (×2): 1000 [IU] via ORAL
  Filled 2020-11-26 (×2): qty 1

## 2020-11-26 MED ORDER — PRAVASTATIN SODIUM 40 MG PO TABS
40.0000 mg | ORAL_TABLET | Freq: Every day | ORAL | Status: DC
  Administered 2020-11-27: 40 mg via ORAL
  Filled 2020-11-26: qty 1

## 2020-11-26 MED ORDER — HYDROMORPHONE HCL 1 MG/ML IJ SOLN
INTRAMUSCULAR | Status: AC
  Filled 2020-11-26: qty 1

## 2020-11-26 MED ORDER — SULFAMETHOXAZOLE-TRIMETHOPRIM 800-160 MG PO TABS: 1.0000 | ORAL_TABLET | Freq: Two times a day (BID) | ORAL | Status: DC

## 2020-11-26 MED ORDER — PROPOFOL 10 MG/ML IV BOLUS
INTRAVENOUS | Status: DC | PRN
  Administered 2020-11-26: 90 mg via INTRAVENOUS

## 2020-11-26 MED ORDER — ACETAMINOPHEN 325 MG PO TABS
650.0000 mg | ORAL_TABLET | ORAL | Status: DC | PRN
  Administered 2020-11-27 – 2020-11-28 (×3): 650 mg via ORAL
  Filled 2020-11-26 (×3): qty 2

## 2020-11-26 MED ORDER — FENTANYL CITRATE (PF) 100 MCG/2ML IJ SOLN
25.0000 ug | INTRAMUSCULAR | Status: DC | PRN
  Administered 2020-11-26 (×3): 50 ug via INTRAVENOUS

## 2020-11-26 MED ORDER — UMECLIDINIUM-VILANTEROL 62.5-25 MCG/INH IN AEPB
1.0000 | INHALATION_SPRAY | Freq: Every day | RESPIRATORY_TRACT | Status: DC | PRN
  Filled 2020-11-26: qty 14

## 2020-11-26 MED ORDER — EPHEDRINE SULFATE-NACL 50-0.9 MG/10ML-% IV SOSY
PREFILLED_SYRINGE | INTRAVENOUS | Status: DC | PRN
  Administered 2020-11-26: 5 mg via INTRAVENOUS

## 2020-11-26 MED ORDER — ONDANSETRON HCL 4 MG/2ML IJ SOLN
INTRAMUSCULAR | Status: DC | PRN
  Administered 2020-11-26: 4 mg via INTRAVENOUS

## 2020-11-26 MED ORDER — FENTANYL CITRATE (PF) 100 MCG/2ML IJ SOLN
INTRAMUSCULAR | Status: DC | PRN
  Administered 2020-11-26 (×4): 25 ug via INTRAVENOUS

## 2020-11-26 MED ORDER — IOHEXOL 300 MG/ML  SOLN
INTRAMUSCULAR | Status: DC | PRN
  Administered 2020-11-26: 10 mL

## 2020-11-26 MED ORDER — ALBUTEROL SULFATE HFA 108 (90 BASE) MCG/ACT IN AERS
2.0000 | INHALATION_SPRAY | Freq: Four times a day (QID) | RESPIRATORY_TRACT | Status: DC | PRN
  Filled 2020-11-26: qty 6.7

## 2020-11-26 MED ORDER — CHLORDIAZEPOXIDE HCL 5 MG PO CAPS
10.0000 mg | ORAL_CAPSULE | Freq: Every day | ORAL | Status: DC
Start: 2020-11-27 — End: 2020-11-28
  Administered 2020-11-27 – 2020-11-28 (×2): 10 mg via ORAL
  Filled 2020-11-26 (×2): qty 2

## 2020-11-26 MED ORDER — HYDROMORPHONE HCL 1 MG/ML IJ SOLN
0.2500 mg | INTRAMUSCULAR | Status: DC | PRN
  Administered 2020-11-26 (×4): 0.5 mg via INTRAVENOUS

## 2020-11-26 MED ORDER — DARIFENACIN HYDROBROMIDE ER 15 MG PO TB24
15.0000 mg | ORAL_TABLET | Freq: Every day | ORAL | Status: DC
  Administered 2020-11-27 – 2020-11-28 (×2): 15 mg via ORAL
  Filled 2020-11-26 (×2): qty 1

## 2020-11-26 MED ORDER — OXYCODONE HCL 5 MG PO TABS: 5.0000 mg | ORAL_TABLET | Freq: Once | ORAL | Status: DC | PRN

## 2020-11-26 MED ORDER — CEFAZOLIN SODIUM-DEXTROSE 2-4 GM/100ML-% IV SOLN
INTRAVENOUS | Status: AC
  Filled 2020-11-26: qty 100

## 2020-11-26 MED ORDER — ORAL CARE MOUTH RINSE: 15.0000 mL | Freq: Once | OROMUCOSAL | Status: AC

## 2020-11-26 MED ORDER — DEXAMETHASONE SODIUM PHOSPHATE 10 MG/ML IJ SOLN
INTRAMUSCULAR | Status: AC
  Filled 2020-11-26: qty 1

## 2020-11-26 MED ORDER — LACTATED RINGERS IV SOLN
INTRAVENOUS | Status: DC
  Administered 2020-11-26: 1000 mL via INTRAVENOUS

## 2020-11-26 MED ORDER — HYDROMORPHONE HCL 1 MG/ML IJ SOLN
0.5000 mg | INTRAMUSCULAR | Status: DC | PRN
  Administered 2020-11-27: 1 mg via INTRAVENOUS
  Filled 2020-11-26: qty 1

## 2020-11-26 MED ORDER — CEFAZOLIN SODIUM-DEXTROSE 2-3 GM-%(50ML) IV SOLR
INTRAVENOUS | Status: DC | PRN
Start: 2020-11-26 — End: 2020-11-26
  Administered 2020-11-26: 2 g via INTRAVENOUS

## 2020-11-26 MED ORDER — HYDROCODONE-ACETAMINOPHEN 5-325 MG PO TABS
ORAL_TABLET | ORAL | Status: AC
  Filled 2020-11-26: qty 1

## 2020-11-26 MED ORDER — PHENYLEPHRINE HCL (PRESSORS) 10 MG/ML IV SOLN
INTRAVENOUS | Status: AC
  Filled 2020-11-26: qty 2

## 2020-11-26 MED ORDER — EPHEDRINE SULFATE 50 MG/ML IJ SOLN
INTRAMUSCULAR | Status: DC | PRN
  Administered 2020-11-26: 10 mg via INTRAVENOUS
  Administered 2020-11-26: 5 mg via INTRAVENOUS

## 2020-11-26 MED ORDER — LIDOCAINE HCL (PF) 2 % IJ SOLN
INTRAMUSCULAR | Status: AC
  Filled 2020-11-26: qty 5

## 2020-11-26 MED ORDER — SODIUM CHLORIDE 0.9 % IR SOLN
3000.0000 mL | Status: DC
  Administered 2020-11-26 (×2): 3000 mL

## 2020-11-26 MED ORDER — HYDROCODONE-ACETAMINOPHEN 5-325 MG PO TABS
1.0000 | ORAL_TABLET | ORAL | Status: DC | PRN
  Administered 2020-11-26 (×2): 2 via ORAL
  Administered 2020-11-26 – 2020-11-28 (×4): 1 via ORAL
  Filled 2020-11-26 (×3): qty 1
  Filled 2020-11-26: qty 2

## 2020-11-26 MED ORDER — SODIUM CHLORIDE 0.45 % IV SOLN: INTRAVENOUS | Status: DC

## 2020-11-26 MED ORDER — LIDOCAINE 2% (20 MG/ML) 5 ML SYRINGE
INTRAMUSCULAR | Status: DC | PRN
  Administered 2020-11-26: 60 mg via INTRAVENOUS

## 2020-11-26 MED ORDER — DEXAMETHASONE SODIUM PHOSPHATE 10 MG/ML IJ SOLN
INTRAMUSCULAR | Status: DC | PRN
  Administered 2020-11-26: 8 mg via INTRAVENOUS

## 2020-11-26 MED ORDER — ACETAMINOPHEN 325 MG PO TABS: 325.0000 mg | ORAL_TABLET | Freq: Four times a day (QID) | ORAL | Status: DC | PRN

## 2020-11-26 MED ORDER — PROPOFOL 10 MG/ML IV BOLUS
INTRAVENOUS | Status: AC
  Filled 2020-11-26: qty 20

## 2020-11-26 MED ORDER — OXYCODONE HCL 5 MG/5ML PO SOLN: 5.0000 mg | Freq: Once | ORAL | Status: DC | PRN

## 2020-11-26 SURGICAL SUPPLY — 37 items
BAG URINE DRAIN 2000ML AR STRL (UROLOGICAL SUPPLIES) IMPLANT
BAG URO CATCHER STRL LF (MISCELLANEOUS) ×3 IMPLANT
BRUSH URET BIOPSY 3F (UROLOGICAL SUPPLIES) ×3 IMPLANT
CATH FOLEY 2WAY SLVR 30CC 24FR (CATHETERS) IMPLANT
CATH HEMA 3WAY 30CC 22FR COUDE (CATHETERS) ×3 IMPLANT
CATH INTERMIT  6FR 70CM (CATHETERS) IMPLANT
CATH URET 5FR 28IN OPEN ENDED (CATHETERS) ×3 IMPLANT
CLOTH BEACON ORANGE TIMEOUT ST (SAFETY) ×3 IMPLANT
ELECT REM PT RETURN 15FT ADLT (MISCELLANEOUS) ×3 IMPLANT
EVACUATOR MICROVAS BLADDER (UROLOGICAL SUPPLIES) IMPLANT
EXTRACTOR STONE NITINOL NGAGE (UROLOGICAL SUPPLIES) IMPLANT
GLOVE BIOGEL M 8.0 STRL (GLOVE) ×3 IMPLANT
GOWN STRL REUS W/TWL XL LVL3 (GOWN DISPOSABLE) ×3 IMPLANT
GUIDEWIRE ANG ZIPWIRE 038X150 (WIRE) ×3 IMPLANT
GUIDEWIRE STR DUAL SENSOR (WIRE) ×6 IMPLANT
IV NS 1000ML (IV SOLUTION) ×3
IV NS 1000ML BAXH (IV SOLUTION) ×2 IMPLANT
KIT TURNOVER KIT A (KITS) IMPLANT
LASER FIB FLEXIVA PULSE ID 365 (Laser) IMPLANT
LOOP CUT BIPOLAR 24F LRG (ELECTROSURGICAL) ×3 IMPLANT
LOOP MONOPOLAR YLW (ELECTROSURGICAL) IMPLANT
MANIFOLD NEPTUNE II (INSTRUMENTS) ×3 IMPLANT
NDL SAFETY ECLIPSE 18X1.5 (NEEDLE) ×2 IMPLANT
NEEDLE HYPO 18GX1.5 SHARP (NEEDLE) ×3
PACK CYSTO (CUSTOM PROCEDURE TRAY) ×3 IMPLANT
PENCIL SMOKE EVACUATOR (MISCELLANEOUS) IMPLANT
SHEATH URETERAL 12FRX28CM (UROLOGICAL SUPPLIES) IMPLANT
SHEATH URETERAL 12FRX35CM (MISCELLANEOUS) IMPLANT
SHEATH URETERAL 12FRX55CM (UROLOGICAL SUPPLIES) IMPLANT
SYR 50ML LL SCALE MARK (SYRINGE) ×3 IMPLANT
SYR TOOMEY IRRIG 70ML (MISCELLANEOUS) ×3
SYRINGE TOOMEY IRRIG 70ML (MISCELLANEOUS) ×2 IMPLANT
TRACTIP FLEXIVA PULS ID 200XHI (Laser) IMPLANT
TRACTIP FLEXIVA PULSE ID 200 (Laser)
TUBING CONNECTING 10 (TUBING) ×3 IMPLANT
TUBING UROLOGY SET (TUBING) ×3 IMPLANT
WATER STERILE IRR 3000ML UROMA (IV SOLUTION) ×3 IMPLANT

## 2020-11-26 NOTE — Anesthesia Postprocedure Evaluation (Signed)
Anesthesia Post Note  Patient: Donalynn Furlong III  Procedure(s) Performed: CYSTOSCOPY WITH LEFT RETROGRADE PYELOGRAM, URETEROSCOPY, BRUSH BIOPSY, TURBT (Left )     Patient location during evaluation: PACU Anesthesia Type: General Level of consciousness: awake and alert Pain management: pain level controlled Vital Signs Assessment: post-procedure vital signs reviewed and stable Respiratory status: spontaneous breathing, nonlabored ventilation, respiratory function stable and patient connected to nasal cannula oxygen Cardiovascular status: blood pressure returned to baseline and stable Postop Assessment: no apparent nausea or vomiting Anesthetic complications: no   No complications documented.  Last Vitals:  Vitals:   11/26/20 1515 11/26/20 1530  BP: (!) 149/65 (!) 150/88  Pulse: 71 68  Resp: 20 10  Temp: (!) 36.3 C (!) 36.3 C  SpO2: 100% 98%    Last Pain:  Vitals:   11/26/20 1530  TempSrc:   PainSc: [place]

## 2020-11-26 NOTE — Discharge Instructions (Signed)
1. You may see some blood in the urine and may have some burning with urination for 48-72 hours. You also may notice that you have to urinate more frequently or urgently after your procedure which is normal.  2. You should call should you develop an inability urinate, fever > 101, persistent nausea and vomiting that prevents you from eating or 3. If you have a catheter, you will be taught how to take care of the catheter by the nursing staff prior to discharge from the hospital.  You may periodically feel a strong urge to void with the catheter in place.  This is a bladder spasm and most often can occur when having a bowel movement or moving around. It is typically self-limited and usually will stop after a few minutes.  You may use some Vaseline or Neosporin around the tip of the catheter to reduce friction at the tip of the penis. You may also see some blood in the urine.  A very small amount of blood can make the urine look quite red.  As long as the catheter is draining well, there usually is not a problem.  However, if the catheter is not draining well and is bloody, you should call the office [phone]) to notify us.

## 2020-11-26 NOTE — Anesthesia Procedure Notes (Signed)
Procedure Name: LMA Insertion Date/Time: 11/26/2020 12:36 PM Performed by: Victoriano Lain, CRNA Pre-anesthesia Checklist: Patient identified, Emergency Drugs available, Suction available, Patient being monitored and Timeout performed Patient Re-evaluated:Patient Re-evaluated prior to induction Oxygen Delivery Method: Circle system utilized Preoxygenation: Pre-oxygenation with 100% oxygen Induction Type: IV induction LMA: LMA with gastric port inserted LMA Size: 4.0 Number of attempts: 1 Placement Confirmation: positive ETCO2 and breath sounds checked- equal and bilateral Tube secured with: Tape Dental Injury: Teeth and Oropharynx as per pre-operative assessment

## 2020-11-26 NOTE — Anesthesia Preprocedure Evaluation (Signed)
Anesthesia Evaluation  Patient identified by MRN, date of birth, ID band Patient awake    Reviewed: Allergy & Precautions, H&P , NPO status , Patient's Chart, lab work & pertinent test results  Airway Mallampati: II   Neck ROM: full    Dental   Pulmonary asthma , COPD, former smoker,    breath sounds clear to auscultation       Cardiovascular negative cardio ROS   Rhythm:regular Rate:Normal     Neuro/Psych PSYCHIATRIC DISORDERS Anxiety    GI/Hepatic   Endo/Other    Renal/GU Renal InsufficiencyRenal disease     Musculoskeletal   Abdominal   Peds  Hematology   Anesthesia Other Findings   Reproductive/Obstetrics                             Anesthesia Physical Anesthesia Plan  ASA: II  Anesthesia Plan: General   Post-op Pain Management:    Induction: Intravenous  PONV Risk Score and Plan: 2 and Ondansetron, Dexamethasone and Treatment may vary due to age or medical condition  Airway Management Planned: LMA  Additional Equipment:   Intra-op Plan:   Post-operative Plan: Extubation in OR  Informed Consent: I have reviewed the patients History and Physical, chart, labs and discussed the procedure including the risks, benefits and alternatives for the proposed anesthesia with the patient or authorized representative who has indicated his/her understanding and acceptance.     Dental advisory given  Plan Discussed with: CRNA, Anesthesiologist and Surgeon  Anesthesia Plan Comments:         Anesthesia Quick Evaluation

## 2020-11-26 NOTE — Transfer of Care (Signed)
Immediate Anesthesia Transfer of Care Note  Patient: Jesse [name]  Procedure(s) Performed: CYSTOSCOPY WITH LEFT RETROGRADE PYELOGRAM, URETEROSCOPY, BRUSH BIOPSY, TURBT (Left )  Patient Location: PACU  Anesthesia Type:General  Level of Consciousness: awake, alert , oriented and patient cooperative  Airway & Oxygen Therapy: Patient Spontanous Breathing and Patient connected to face mask oxygen  Post-op Assessment: Report given to RN, Post -op Vital signs reviewed and stable and Patient moving all extremities  Post vital signs: Reviewed and stable  Last Vitals:  Vitals Value Taken Time  BP 166/88 11/26/20 1357  Temp    Pulse 103 11/26/20 1357  Resp 24 11/26/20 1358  SpO2 100 % 11/26/20 1357  Vitals shown include unvalidated device data.  Last Pain:  Vitals:   11/26/20 1043  TempSrc: Oral         Complications: No complications documented.

## 2020-11-26 NOTE — Progress Notes (Addendum)
PHARMACY NOTE:  ANTIMICROBIAL RENAL DOSAGE ADJUSTMENT  Current antimicrobial regimen includes a mismatch between antimicrobial dosage and estimated renal function.  As per policy approved by the Pharmacy & Therapeutics and Medical Executive Committees, the antimicrobial dosage will be adjusted accordingly.  Current antimicrobial dosage: Bactrim DS 1 tablet PO q12h s/p cystoscopy, left retrograde ureteropyelogram, fluoroscopic interpretation, left ureteroscopy, attempted access of left renal unit, brush biopsy of left ureteral mass, TURBT of 6cm anterior bladder/bladder neck mass  Renal Function:  Estimated Creatinine Clearance: 22.3 mL/min (A) (by C-G formula based on SCr of 1.74 mg/dL (H)). []      On intermittent HD, scheduled: []      On CRRT    Antimicrobial dosage has been changed to: Bactrim SS (single strength, 400-80mg ) 1 tablet PO q12h  Thank you for allowing pharmacy to be a part of this patient's care.  Luiz Ochoa, [facility] 11/26/2020 8:42 PM

## 2020-11-26 NOTE — H&P (Signed)
H&P  Chief Complaint:  Left hydronephrosis, history of bladder cancer  History of Present Illness:  87 year old male with  History of prostate cancer, status post external beam radiotherapy approximately 20 years ago.  He has had no evidence of recurrence.    The patient was diagnosed with bladder cancer in [place], [place] several years ago.  He has been followed here by Dr. Alyson Ingles when he moved back to [place].  He has never had direct evidence of recurrence but has had positive cytologies.    Recently, the patient had CT abdomen and pelvis for gross hematuria as well as his history of bladder cancer.  He has left hydronephrosis with filling defects in his left ureter as well as evidence of a mass in his the anterior bladder.  He presents at this time for cystoscopy, TURBT, left retrograde, possible ureteroscopy and biopsy as well as stent placement.  Past Medical History:  Diagnosis Date  . Anxiety   . Asthma    as child  . Bladder cancer [facility]) urologist-  dr Elliot Gault [facility] in [place], [place])   dx 01/ 2016--- post TURBT and post Bladder bx 02-10-2017  . Bladder tumor   . BPH (benign prostatic hyperplasia)   . Frequency of urination   . History of asthma    childhood  . History of external beam radiation therapy    2002  prostate/ pelvis and boost w/ radioactive prostate seed implants   . History of hypercalcemia    s/p  parathyroidectomy 2003  . History of prostate cancer current urologist-  dr Elliot Gault [facility] in [place], [place])-- per lov note (care everywhere) last PSA undetectable   dx 2002--  urologist-- dr Colman Birdwell/ oncologist dr Valere Dross---  Gleason 7 out of 10, PSA 4.9---post Radioactive Prostate Seed Implant and External Beam Radiation therapy  . History of skin cancer   . Hyperlipidemia   . Multiple lung nodules    since 2006--- followed by pcp --- dr Maudie Mercury  . Pneumonia   . Renal cyst, left   . Wears glasses   . Wears hearing  aid in both ears     Past Surgical History:  Procedure Laterality Date  . CARDIOVASCULAR STRESS TEST  01/01/2004   normal nuclear perfustion study w/ no ischemia/  normal LV function and wall motion, ef 65%  . CATARACT EXTRACTION W/ INTRAOCULAR LENS  IMPLANT, BILATERAL  2010  . COLONOSCOPY    . CYSTO/  BILATERAL RETROGRADE PYELOGRAM/  BLADDER BX'S AND FULGERATION  02-10-2017   dr Anabel Bene [facility] in [place], [place]  . CYSTOSCOPY W/ RETROGRADES Bilateral 07/27/2017   Procedure: CYSTOSCOPY,SELECTIVE CYTOLOGIES,RETROGRADE PYELOGRAM;  Surgeon: Cleon Gustin, MD;  Location: [facility];  Service: Urology;  Laterality: Bilateral;  . CYSTOSCOPY W/ RETROGRADES Bilateral 12/08/2017   Procedure: CYSTOSCOPY WITH RETROGRADE PYELOGRAM;  Surgeon: Cleon Gustin, MD;  Location: [facility];  Service: Urology;  Laterality: Bilateral;  . CYSTOSCOPY WITH BIOPSY N/A 07/27/2017   Procedure: CYSTOSCOPY WITH BIOPSY OF BLADDER AND PROSTATIC URETHRA;  Surgeon: Cleon Gustin, MD;  Location: [facility];  Service: Urology;  Laterality: N/A;  . CYSTOSCOPY WITH BIOPSY Bilateral 12/08/2017   Procedure: CYSTOSCOPY WITH RENAL WASHINGS;  Surgeon: Cleon Gustin, MD;  Location: [facility];  Service: Urology;  Laterality: Bilateral;  . FIBEROPTIC BRONCHOSCOPY  08-20-2005   dr wert   w/ Left lower lobe bx  . PARATHYROIDECTOMY  2003  . RADIOACTIVE PROSTATE SEED IMPLANTS  04-05-2001    dr Diona Fanti [facility]  . TONSILLECTOMY  child  . TRANSURETHRAL RESECTION OF BLADDER TUMOR  11-24-2014   dr Anabel Bene [facility] in [place], [place]  . TRANSURETHRAL RESECTION OF BLADDER TUMOR N/A 04/08/2018   Procedure: TRANSURETHRAL RESECTION OF BLADDER TUMOR (TURBT);  Surgeon: Cleon Gustin, MD;  Location: [facility];  Service: Urology;  Laterality: N/A;  . TRANSURETHRAL RESECTION OF PROSTATE N/A 02/28/2019   Procedure:  TRANSURETHRAL RESECTION OF THE PROSTATE (TURP);  Surgeon: Cleon Gustin, MD;  Location: [facility];  Service: Urology;  Laterality: N/A;  30 MINS  . UPPER GI ENDOSCOPY      Home Medications:  Allergies as of 11/26/2020      Reactions   Adhesive [tape] Other (See Comments)   Tears skin off PAPER TAPE IS OKAY      Medication List    Notice   Cannot display discharge medications because the patient has not yet been admitted.     Allergies:  Allergies  Allergen Reactions  . Adhesive [Tape] Other (See Comments)    Tears skin off PAPER TAPE IS OKAY    No family history on file.  Social History:  reports that he quit smoking about 36 years ago. His smoking use included cigarettes. He quit after 50.00 years of use. He has never used smokeless tobacco. He reports current alcohol use of about 7.0 standard drinks of alcohol per week. He reports that he does not use drugs.  ROS: A complete review of systems was performed.  All systems are negative except for pertinent findings as noted.  Physical Exam:  Vital signs in last 24 hours: There were no vitals taken for this visit. Constitutional:  Alert and oriented, No acute distress Cardiovascular: Regular rate  Respiratory: Normal respiratory effort GI: Abdomen is soft, nontender, nondistended, no abdominal masses. No CVAT.  Genitourinary: Normal male phallus, testes are descended bilaterally and non-tender and without masses, scrotum is normal in appearance without lesions or masses, perineum is normal on inspection. Lymphatic: No lymphadenopathy Neurologic: Grossly intact, no focal deficits Psychiatric: Normal mood and affect  Laboratory Data:  No results for input(s): WBC, HGB, HCT, PLT in the last 72 hours.  No results for input(s): NA, K, CL, GLUCOSE, BUN, CALCIUM, CREATININE in the last 72 hours.  Invalid input(s): CO3   No results found for this or any previous visit (from the past 24 hour(s)). Recent Results (from the  past 240 hour(s))  SARS CORONAVIRUS 2 (TAT 6-24 HRS) Nasopharyngeal Nasopharyngeal Swab     Status: None   Collection Time: 11/22/20  5:44 PM   Specimen: Nasopharyngeal Swab  Result Value Ref Range Status   SARS Coronavirus 2 NEGATIVE NEGATIVE Final    Comment: (NOTE) SARS-CoV-2 target nucleic acids are NOT DETECTED.  The SARS-CoV-2 RNA is generally detectable in upper and lower respiratory specimens during the acute phase of infection. Negative results do not preclude SARS-CoV-2 infection, do not rule out co-infections with other pathogens, and should not be used as the sole basis for treatment or other patient management decisions. Negative results must be combined with clinical observations, patient history, and epidemiological information. The expected result is Negative.  Fact Sheet for Patients: SugarRoll.be  Fact Sheet for Healthcare Providers: https://www.woods-mathews.com/  This test is not yet approved or cleared by the [place] FDA and  has been authorized for detection and/or diagnosis of SARS-CoV-2 by FDA under an Emergency Use Authorization (EUA). This EUA will remain  in effect (meaning this test can be used) for the duration of the COVID-19 declaration under Se ction 564(b)(1) of the Act, 21 U.S.C. section 360bbb-3(b)(1), unless the authorization is terminated or revoked sooner.  Performed at [facility], [place] [address] [place], [place] [address]     Renal Function: No results for input(s): CREATININE in the last 168 hours. Estimated Creatinine Clearance: 22.3 mL/min (A) (by C-G formula based on SCr of 1.74 mg/dL (H)).  Radiologic Imaging: No results found.  Impression/Assessment:    History of bladder cancer with evidence of bladder mass on CT as well as filling defects in his left ureter and left hydronephrosis  Plan:   cystoscopy, bilateral retrograde pyelograms, TURBT, ureteroscopy on the left  side with possible biopsies and stent placement.

## 2020-11-26 NOTE — Interval H&P Note (Signed)
History and Physical Interval Note:  11/26/2020 12:17 PM  Jesse [name]  has presented today for surgery, with the diagnosis of BLADDER MASS, LEFT HYDRONEPHROSIS.  The various methods of treatment have been discussed with the patient and family. After consideration of risks, benefits and other options for treatment, the patient has consented to  Procedure(s) with comments: TRANSURETHRAL RESECTION OF BLADDER TUMOR WITH POSSIBLE GEMCITABINE (N/A) - 90 MINS CYSTOSCOPY WITH RETROGRADE PYELOGRAM, POSSIBLE URETEROSCOPY WITH BIOPSY AND STENT PLACEMENT (Left) as a surgical intervention.  The patient's history has been reviewed, patient examined, no change in status, stable for surgery.  I have reviewed the patient's chart and labs.  Questions were answered to the patient's satisfaction.     Lillette Boxer Qiara Minetti

## 2020-11-26 NOTE — Op Note (Signed)
Operative diagnosis: History of bladder cancer with anterior bladder mass, left hydronephrosis with filling defects in left ureter  Postoperative diagnosis: History of bladder cancer with anterior bladder mass, left hydronephrosis, filling defects in left ureter, inability to access left kidney  Principal procedure: Cystoscopy, left retrograde ureteropyelogram, fluoroscopic interpretation, left ureteroscopy, attempted access of left renal unit, brush biopsy of left ureteral mass, TURBT of 6 cm anterior bladder/bladder neck mass  Surgeon: Kizer Nobbe  Anesthesia: General with LMA  Complications: Inability to access left kidney secondary to impassable mass in left ureter  Estimated blood loss: Less than 50 mL  Specimen: 1.  Brush biopsy of left ureteral mass, for cytology 2.  Bladder tumor fragments  Drains: 22 French three-way hematuria catheter  Indications: 87 year old male years out from combination radiotherapy for prostate cancer.  The patient left [place] for several years, and in [place], [place] was diagnosed with bladder cancer and had resection.  The patient subsequently moved back to [facility] and has been followed by Dr. Alyson Ingles.  He has had positive cytologies but has never had specimen from bladder biopsies revealing recurrent bladder cancer.  However, recent hematuria CT revealed the patient to have significant left hydronephrosis with filling defects in his left ureter.  There was also apparently an anterior bladder wall/bladder neck mass.  He presents at this time for definitive management of this left hydronephrosis with possible ureteroscopy, biopsy and stent placement, as well as resection of this anterior bladder mass.  The patient does have an indwelling Foley catheter because of retention.  I discussed the process of TURBT and possible ureteroscopy and biopsy with subsequent stent placement in his left ureter.  He understands the procedure as well as expected  risks and complications which include but are not limited to infection, bleeding, possible ureteral injury, stent placement, anesthetic complications, bladder perforation from his TURBT.  He understands and desires to proceed.  Findings: Prostatic urethra was patulous, with no evidence of bladder neck contracture.  There was a cystic appearing mass anteriorly at the bladder neck, extending from the 9:00 location to the 3:00 location in a clockwise fashion.  It was approximately 6 cm in length by 2 cm in width.  Once resection was started there were papillary features to this.  The remaining urothelium of the bladder appeared normal.  Ureteral orifice ease were normal in configuration location.  Retrograde study of the left ureter revealed distal 2 to 3 cm being normal, but there was an abrupt cut off above this.  No amount of pressure was able to force contrast material more proximally.  Endoscopically, there was a tiny space on the medial aspect of the ureter which was present but would not admit any guidewire either a sensor tip or zip wire.  Description of procedure: The patient was properly identified and marked in the holding area.  He received preoperative IV antibiotics.  He was taken to the operating room where general anesthetic was administered with the LMA.  Is placed in the dorsolithotomy position.  His catheter was removed.  Genitalia and perineum were prepped, draped, proper timeout was performed.  39 French panendoscope was advanced under direct vision through the urethra and into the bladder.  The above-mentioned findings were noted.  Retrograde study of the left ureter was performed using a 6 [place] open-ended catheter and Omnipaque.  There was significant obstruction approximately 2 to 3 cm up the ureter.  I tried negotiating both a sensor tip guidewire as well as the zip wire past  this.  This was unsuccessful.  If the guidewire did get by, retrograde study using a 5 [place] open-ended  catheter revealed extravasation, so I used the ureteroscope to negotiate this area.  Again, it was obviously significantly obstructed.  With the assistance of the ureteroscope I still was unable to guide a guidewire past this area.  I then used the ureteral brush catheter and took vigorous brushings of this area.  This was then sent for cytology labeled "ureteral mass".  At this point, I backed out of the ureteroscopic procedure on this left side.  Obviously, a stent was not left.  Using the visual obturator, the resectoscope sheath was passed, and the resectoscope was placed with the loop.  Using bipolar cautery, the entire anterior bladder mass was resected down to what I figured was the muscular layer.  It was quite difficult, as this was very anterior.  Once the entire cystic/papillary abnormality was resected, the base of this tumor was then cauterized.  There was no active bleeding at this point.  Tumor fragments were sent after they were irrigated from the bladder with a [place] syringe, labeled "bladder tumor".  The bladder was inspected.  No active bleeding was present.  At this point, the procedure was terminated.  The scope was removed.  A 22 French three-way hematuria coud tip catheter was then placed, the balloon filled with 45 cc of water.  This was hooked to CBI using saline.  Mild amount of traction was performed.  At this point the patient was awakened, taken to the PACU in stable condition, having tolerated the procedure well.

## 2020-11-27 ENCOUNTER — Encounter (HOSPITAL_COMMUNITY): Payer: Self-pay | Admitting: Urology

## 2020-11-27 DIAGNOSIS — C678 Malignant neoplasm of overlapping sites of bladder: Secondary | ICD-10-CM | POA: Diagnosis not present

## 2020-11-27 LAB — CYTOLOGY - NON PAP

## 2020-11-27 LAB — SURGICAL PATHOLOGY

## 2020-11-27 MED ORDER — CHLORHEXIDINE GLUCONATE CLOTH 2 % EX PADS
6.0000 | MEDICATED_PAD | Freq: Every day | CUTANEOUS | Status: DC
  Administered 2020-11-27 – 2020-11-28 (×2): 6 via TOPICAL

## 2020-11-27 MED ORDER — MUPIROCIN 2 % EX OINT
1.0000 "application " | TOPICAL_OINTMENT | Freq: Two times a day (BID) | CUTANEOUS | Status: DC
  Administered 2020-11-27 (×3): 1 via NASAL
  Filled 2020-11-27: qty 22

## 2020-11-27 MED ORDER — CHLORHEXIDINE GLUCONATE CLOTH 2 % EX PADS: 6.0000 | MEDICATED_PAD | Freq: Every day | CUTANEOUS | Status: DC

## 2020-11-27 NOTE — Discharge Summary (Signed)
Physician Discharge Summary  Patient ID: Jesse [name] MRN: [id number] DOB/AGE: [birth year] 85 y.o.  Admit date: 11/26/2020 Discharge date: 11/27/2020  Admission Diagnoses:  Malignant neoplasm of overlapping sites of bladder [facility])  Discharge Diagnoses:  Principal Problem:   Malignant neoplasm of overlapping sites of bladder [facility])   Past Medical History:  Diagnosis Date  . Anxiety   . Asthma    as child  . Bladder cancer [facility]) urologist-  dr Elliot Gault [facility] in [place], [place])   dx 01/ 2016--- post TURBT and post Bladder bx 02-10-2017  . Bladder tumor   . BPH (benign prostatic hyperplasia)   . Frequency of urination   . History of asthma    childhood  . History of external beam radiation therapy    2002  prostate/ pelvis and boost w/ radioactive prostate seed implants   . History of hypercalcemia    s/p  parathyroidectomy 2003  . History of prostate cancer current urologist-  dr Elliot Gault [facility] in [place], [place])-- per lov note (care everywhere) last PSA undetectable   dx 2002--  urologist-- dr dahlstedt/ oncologist dr Valere Dross---  Gleason 7 out of 10, PSA 4.9---post Radioactive Prostate Seed Implant and External Beam Radiation therapy  . History of skin cancer   . Hyperlipidemia   . Multiple lung nodules    since 2006--- followed by pcp --- dr Maudie Mercury  . Pneumonia   . Renal cyst, left   . Wears glasses   . Wears hearing aid in both ears     Surgeries: Procedure(s): CYSTOSCOPY WITH LEFT RETROGRADE PYELOGRAM, URETEROSCOPY, BRUSH BIOPSY, TURBT on 11/26/2020   Consultants (if any):   Discharged Condition: Improved  Hospital Course: Jesse [name] is an 87 y.o. male who was admitted 11/26/2020 with a diagnosis of Malignant neoplasm of overlapping sites of bladder ([place]) and went to the operating room on 11/26/2020 and underwent the above named procedures.  The foley was removed this morning and he is voiding with some urgency but without  difficulty.  He has no flank or SP pain.  The urine is minimally pink.  I will have him bladder scanned and if the PVR is acceptable, he will be discharged home.     He was given perioperative antibiotics:  Anti-infectives (From admission, onward)   Start     Dose/Rate Route Frequency Ordered Stop   11/26/20 2200  sulfamethoxazole-trimethoprim (BACTRIM DS) 800-160 MG per tablet 1 tablet  Status:  Discontinued        1 tablet Oral Every 12 hours 11/26/20 2015 11/26/20 2053   11/26/20 2200  sulfamethoxazole-trimethoprim (BACTRIM) 400-80 MG per tablet 1 tablet        1 tablet Oral Every 12 hours 11/26/20 2053     11/26/20 1238  ceFAZolin (ANCEF) 2-4 GM/100ML-% IVPB       Note to Pharmacy: Charmayne Sheer   : cabinet override      11/26/20 1238 11/27/20 0044    .  He was given sequential compression devices for DVT prophylaxis.  He benefited maximally from the hospital stay and there were no complications.    Recent vital signs:  Vitals:   11/27/20 0343 11/27/20 0827  BP: 102/60 (!) 106/51  Pulse: 79 77  Resp: 16 14  Temp: 98 F (36.7 C) (!) 97.5 F (36.4 C)  SpO2: 96% 98%    Recent laboratory studies:  Lab Results  Component Value Date   HGB 13.6 11/18/2020   HGB 12.6 (L)  05/31/2020   HGB 10.7 (L) 05/04/2020   Lab Results  Component Value Date   WBC 6.6 11/18/2020   PLT 193 11/18/2020   No results found for: INR Lab Results  Component Value Date   NA 141 11/18/2020   K 4.2 11/18/2020   CL 104 11/18/2020   CO2 30 11/18/2020   BUN 24 (H) 11/18/2020   CREATININE 1.74 (H) 11/18/2020   GLUCOSE 108 (H) 11/18/2020    Discharge Medications:   Allergies as of 11/27/2020      Reactions   Adhesive [tape] Other (See Comments)   Tears skin off PAPER TAPE IS OKAY      Medication List    TAKE these medications   acetaminophen 325 MG tablet Commonly known as: TYLENOL Take 325 mg by mouth every 6 (six) hours as needed (for pain).   albuterol 108 (90 Base) MCG/ACT  inhaler Commonly known as: VENTOLIN HFA Inhale 2 puffs into the lungs every 6 (six) hours as needed. What changed: reasons to take this   chlordiazePOXIDE 10 MG capsule Commonly known as: LIBRIUM TAKE 1 CAPSULE BY MOUTH THREE TIMES DAILY AS NEEDED What changed: See the new instructions.   pravastatin 40 MG tablet Commonly known as: PRAVACHOL Take 40 mg by mouth daily. In the morning.   solifenacin 10 MG tablet Commonly known as: VESICARE Take 10 mg by mouth daily. In the morning.   tamsulosin 0.4 MG Caps capsule Commonly known as: FLOMAX Take 0.4 mg by mouth daily.   umeclidinium-vilanterol 62.5-25 MCG/INH Aepb Commonly known as: ANORO ELLIPTA Inhale 1 puff into the lungs daily. What changed:   when to take this  reasons to take this   Vitamin D3 25 MCG (1000 UT) Caps Take 1,000 Units by mouth daily.       Diagnostic Studies: DG C-Arm 1-60 Min-No Report  Result Date: 11/26/2020 Fluoroscopy was utilized by the requesting physician.  No radiographic interpretation.    Disposition: Discharge disposition: 01-Home or Self Care       Discharge Instructions    Discontinue IV   Complete by: As directed        Follow-up Information    [facility] Follow up.   Why: If not scheduled, please call to arrange f/u with Dr. .Diona Fanti in 1-2 weeks.  Contact information: [address] [place]               Signed: Irine Seal 11/27/2020, 9:39 AM

## 2020-11-27 NOTE — Care Management Obs Status (Signed)
[place] NOTIFICATION   Patient Details  Name: Jesse [name] MRN: [id number] Date of Birth: 1931-11-27   Medicare Observation Status Notification Given:  Yes    Dessa Phi, RN 11/27/2020, 9:38 AM

## 2020-11-27 NOTE — TOC Initial Note (Signed)
Transition of Care [facility]) - Initial/Assessment Note    Patient Details  Name: Jesse [name] MRN: [id number] Date of Birth: 11-Feb-1932  Transition of Care [facility]) CM/SW Contact:    Dessa Phi, RN Phone Number: 11/27/2020, 9:40 AM  Clinical Narrative:  Spoke patient's son earle on phone. Patient from [facility]-Indep ambulating,adl's.Family will transport back to [employer] apt.                 Expected Discharge Plan: Home/Self Care Barriers to Discharge: Continued Medical Work up   Patient Goals and CMS Choice Patient states their goals for this hospitalization and ongoing recovery are:: return home CMS Medicare.gov Compare Post Acute Care list provided to:: Patient Represenative (must comment)    Expected Discharge Plan and Services Expected Discharge Plan: Home/Self Care   Discharge Planning Services: CM Consult   Living arrangements for the past 2 months: [place] Expected Discharge Date: 11/27/20                                    Prior Living Arrangements/Services Living arrangements for the past 2 months: [place] Lives with:: Spouse Patient language and need for interpreter reviewed:: Yes Do you feel safe going back to the place where you live?: Yes      Need for Family Participation in Patient Care: No (Comment) Care giver support system in place?: Yes (comment)   Criminal Activity/Legal Involvement Pertinent to Current Situation/Hospitalization: No - Comment as needed  Activities of Daily Living Home Assistive Devices/Equipment: Hearing aid,Eyeglasses ADL Screening (condition at time of admission) Patient's cognitive ability adequate to safely complete daily activities?: Yes Is the patient deaf or have difficulty hearing?: Yes Does the patient have difficulty seeing, even when wearing glasses/contacts?: No Does the patient have difficulty concentrating, remembering, or making decisions?:  No Patient able to express need for assistance with ADLs?: Yes Does the patient have difficulty dressing or bathing?: No Independently performs ADLs?: Yes (appropriate for developmental age) Does the patient have difficulty walking or climbing stairs?: Yes Weakness of Legs: None Weakness of Arms/Hands: None  Permission Sought/Granted Permission sought to share information with : Case Manager Permission granted to share information with : Yes, Verbal Permission Granted  Share Information with NAME: Case Manager     Permission granted to share info w Relationship: Eligah son [phone]     Emotional Assessment Appearance:: Appears stated age Attitude/Demeanor/Rapport: Gracious Affect (typically observed): Accepting Orientation: : Oriented to Self,Oriented to Place,Oriented to  Time,Oriented to Situation Alcohol / Substance Use: Not Applicable Psych Involvement: No (comment)  Admission diagnosis:  Malignant neoplasm of overlapping sites of bladder [facility]) [C67.8] Patient Active Problem List   Diagnosis Date Noted  . Malignant neoplasm of overlapping sites of bladder ([place]) 11/26/2020  . Exudative age-related macular degeneration of right eye with inactive choroidal neovascularization ([place]) 07/12/2020  . Right epiretinal membrane 07/12/2020  . Advanced nonexudative age-related macular degeneration of right eye with subfoveal involvement 07/12/2020  . Intermediate stage nonexudative age-related macular degeneration of left eye 07/12/2020  . COPD (chronic obstructive pulmonary disease) with emphysema ([place]) 06/05/2020  . Hyperlipidemia 06/05/2020  . Pneumonia 05/02/2020  . Pleural effusion 05/02/2020  . Pericardial effusion 05/02/2020  . Kidney disease, chronic, stage III (GFR 30-59 ml/min) ([place]) 05/02/2020  . Chest pain 04/10/2020  . Hypotension 11/28/2019  . Anxiety 11/28/2019  . Poor appetite 11/28/2019  . Acute  respiratory failure due to COVID-19 ([place]) 11/20/2019  . BPH  (benign prostatic hyperplasia) 11/20/2019  . History of prostate cancer 11/20/2019  . Acute respiratory failure ([place]) 11/20/2019   PCP:  Virgie Dad, MD Pharmacy:   [facility] [place], [place] - [address] [place] [address] Phone: [phone] Fax: [place], [place] [address] [place] [address] Phone: [phone] Fax: [phone]     Social Determinants of Health (SDOH) Interventions    Readmission Risk Interventions No flowsheet data found.

## 2020-11-27 NOTE — Plan of Care (Signed)

## 2020-11-28 DIAGNOSIS — C678 Malignant neoplasm of overlapping sites of bladder: Secondary | ICD-10-CM | POA: Diagnosis not present

## 2020-11-28 MED ORDER — LIDOCAINE HCL URETHRAL/MUCOSAL 2 % EX GEL
1.0000 "application " | Freq: Once | CUTANEOUS | Status: DC
  Filled 2020-11-28: qty 5

## 2020-11-28 MED ORDER — SULFAMETHOXAZOLE-TRIMETHOPRIM 400-80 MG PO TABS: 1.0000 | ORAL_TABLET | Freq: Two times a day (BID) | ORAL | 0 refills | Status: DC

## 2020-11-28 MED ORDER — LIDOCAINE HCL URETHRAL/MUCOSAL 2 % EX GEL: 1.0000 "application " | Freq: Once | CUTANEOUS | Status: DC

## 2020-11-28 MED ORDER — HYDROCODONE-ACETAMINOPHEN 5-325 MG PO TABS: 1.0000 | ORAL_TABLET | ORAL | 0 refills | Status: DC | PRN

## 2020-11-28 NOTE — Progress Notes (Signed)
Resumed care for this pt at 0100. I agree with previous RN's assessment. Pt assisted from chair to bed. C/o anxiety but states that he doesn't want any medication at this time.

## 2020-11-28 NOTE — TOC Transition Note (Signed)
Transition of Care [facility]) - CM/SW Discharge Note   Patient Details  Name: Jesse [name] MRN: [id number] Date of Birth: [birth year]  Transition of Care [facility]) CM/SW Contact:  Dessa Phi, RN Phone Number: 11/28/2020, 9:04 AM   Clinical Narrative: d/c home. No CM needs.      Final next level of care: Home/Self Care Barriers to Discharge: No Barriers Identified   Patient Goals and CMS Choice Patient states their goals for this hospitalization and ongoing recovery are:: return home CMS Medicare.gov Compare Post Acute Care list provided to:: Patient Represenative (must comment)    Discharge Placement                       Discharge Plan and Services   Discharge Planning Services: CM Consult                                 Social Determinants of Health (SDOH) Interventions     Readmission Risk Interventions No flowsheet data found.

## 2021-01-10 ENCOUNTER — Encounter (INDEPENDENT_AMBULATORY_CARE_PROVIDER_SITE_OTHER): Payer: Self-pay | Admitting: Ophthalmology

## 2021-01-10 ENCOUNTER — Ambulatory Visit (INDEPENDENT_AMBULATORY_CARE_PROVIDER_SITE_OTHER): Payer: Medicare Other | Admitting: Ophthalmology

## 2021-01-10 ENCOUNTER — Other Ambulatory Visit: Payer: Self-pay

## 2021-01-10 DIAGNOSIS — H353221 Exudative age-related macular degeneration, left eye, with active choroidal neovascularization: Secondary | ICD-10-CM | POA: Diagnosis not present

## 2021-01-10 DIAGNOSIS — H353122 Nonexudative age-related macular degeneration, left eye, intermediate dry stage: Secondary | ICD-10-CM

## 2021-01-10 DIAGNOSIS — H353212 Exudative age-related macular degeneration, right eye, with inactive choroidal neovascularization: Secondary | ICD-10-CM

## 2021-01-10 DIAGNOSIS — H35371 Puckering of macula, right eye: Secondary | ICD-10-CM | POA: Diagnosis not present

## 2021-01-10 NOTE — Assessment & Plan Note (Signed)
OS with new progression to wet AMD today

## 2021-01-10 NOTE — Assessment & Plan Note (Signed)
Moderate severe epiretinal membrane, not the cause of poor vision.

## 2021-01-10 NOTE — Progress Notes (Signed)
01/10/2021     CHIEF COMPLAINT Patient presents for Retina Follow Up (6 Mo F/U OU//Pt c/o OS VA getting "weaker" when watching TV x 6 months. Pt sts, "it's not bad, just a little worse." VA OD stable.)   HISTORY OF PRESENT ILLNESS: Jesse [name] is a 87 y.o. male who presents to the clinic today for:   HPI    Retina Follow Up    Patient presents with  Wet AMD.  In right eye.  This started 6 weeks ago.  Severity is mild.  Duration of 6 months.  Since onset it is gradually worsening. Additional comments: 6 Mo F/U OU  Pt c/o OS VA getting "weaker" when watching TV x 6 months. Pt sts, "it's not bad, just a little worse." VA OD stable.       Last edited by Rockie Neighbours, [place] on 01/10/2021 12:58 PM. (History)      Referring physician: Virgie Dad, MD [place],  [place] [address]  HISTORICAL INFORMATION:   Selected notes from the MEDICAL RECORD NUMBER       CURRENT MEDICATIONS: No current outpatient medications on file. (Ophthalmic Drugs)   No current facility-administered medications for this visit. (Ophthalmic Drugs)   Current Outpatient Medications (Other)  Medication Sig  . acetaminophen (TYLENOL) 325 MG tablet Take 325 mg by mouth every 6 (six) hours as needed (for pain).  Marland Kitchen albuterol (VENTOLIN HFA) 108 (90 Base) MCG/ACT inhaler Inhale 2 puffs into the lungs every 6 (six) hours as needed. (Patient taking differently: Inhale 2 puffs into the lungs every 6 (six) hours as needed for shortness of breath or wheezing.)  . chlordiazePOXIDE (LIBRIUM) 10 MG capsule TAKE 1 CAPSULE BY MOUTH THREE TIMES DAILY AS NEEDED (Patient taking differently: Take 10 mg by mouth daily.)  . Cholecalciferol (VITAMIN D3) 1000 units CAPS Take 1,000 Units by mouth daily.  Marland Kitchen HYDROcodone-acetaminophen (NORCO/VICODIN) 5-325 MG tablet Take 1 tablet by mouth every 4 (four) hours as needed for moderate pain or severe pain.  . pravastatin (PRAVACHOL) 40 MG tablet Take 40 mg by mouth  daily. In the morning.  . solifenacin (VESICARE) 10 MG tablet Take 10 mg by mouth daily. In the morning.  . sulfamethoxazole-trimethoprim (BACTRIM) 400-80 MG tablet Take 1 tablet by mouth every 12 (twelve) hours.  . tamsulosin (FLOMAX) 0.4 MG CAPS capsule Take 0.4 mg by mouth daily.  Marland Kitchen umeclidinium-vilanterol (ANORO ELLIPTA) 62.5-25 MCG/INH AEPB Inhale 1 puff into the lungs daily. (Patient taking differently: Inhale 1 puff into the lungs daily as needed (SOB).)   No current facility-administered medications for this visit. (Other)      REVIEW OF SYSTEMS:    ALLERGIES Allergies  Allergen Reactions  . Adhesive [Tape] Other (See Comments)    Tears skin off PAPER TAPE IS OKAY    PAST MEDICAL HISTORY Past Medical History:  Diagnosis Date  . Anxiety   . Asthma    as child  . Bladder cancer [facility]) urologist-  dr Elliot Gault [facility] in [place], [place])   dx 01/ 2016--- post TURBT and post Bladder bx 02-10-2017  . Bladder tumor   . BPH (benign prostatic hyperplasia)   . Frequency of urination   . History of asthma    childhood  . History of external beam radiation therapy    2002  prostate/ pelvis and boost w/ radioactive prostate seed implants   . History of hypercalcemia    s/p  parathyroidectomy 2003  . History  of prostate cancer current urologist-  dr Elliot Gault [facility] in [place], [place])-- per lov note (care everywhere) last PSA undetectable   dx 2002--  urologist-- dr dahlstedt/ oncologist dr Valere Dross---  Gleason 7 out of 10, PSA 4.9---post Radioactive Prostate Seed Implant and External Beam Radiation therapy  . History of skin cancer   . Hyperlipidemia   . Multiple lung nodules    since 2006--- followed by pcp --- dr Maudie Mercury  . Pneumonia   . Renal cyst, left   . Wears glasses   . Wears hearing aid in both ears    Past Surgical History:  Procedure Laterality Date  . CARDIOVASCULAR STRESS TEST  01/01/2004   normal nuclear perfustion study w/ no  ischemia/  normal LV function and wall motion, ef 65%  . CATARACT EXTRACTION W/ INTRAOCULAR LENS  IMPLANT, BILATERAL  2010  . COLONOSCOPY    . CYSTO/  BILATERAL RETROGRADE PYELOGRAM/  BLADDER BX'S AND FULGERATION  02-10-2017   dr Anabel Bene [facility] in [place], [place]  . CYSTOSCOPY W/ RETROGRADES Bilateral 07/27/2017   Procedure: CYSTOSCOPY,SELECTIVE CYTOLOGIES,RETROGRADE PYELOGRAM;  Surgeon: Cleon Gustin, MD;  Location: [facility];  Service: Urology;  Laterality: Bilateral;  . CYSTOSCOPY W/ RETROGRADES Bilateral 12/08/2017   Procedure: CYSTOSCOPY WITH RETROGRADE PYELOGRAM;  Surgeon: Cleon Gustin, MD;  Location: [facility];  Service: Urology;  Laterality: Bilateral;  . CYSTOSCOPY WITH BIOPSY N/A 07/27/2017   Procedure: CYSTOSCOPY WITH BIOPSY OF BLADDER AND PROSTATIC URETHRA;  Surgeon: Cleon Gustin, MD;  Location: [facility];  Service: Urology;  Laterality: N/A;  . CYSTOSCOPY WITH BIOPSY Bilateral 12/08/2017   Procedure: CYSTOSCOPY WITH RENAL WASHINGS;  Surgeon: Cleon Gustin, MD;  Location: [facility];  Service: Urology;  Laterality: Bilateral;  . CYSTOSCOPY WITH RETROGRADE PYELOGRAM, URETEROSCOPY AND STENT PLACEMENT Left 11/26/2020   Procedure: CYSTOSCOPY WITH LEFT RETROGRADE PYELOGRAM, URETEROSCOPY, BRUSH BIOPSY, TURBT;  Surgeon: Franchot Gallo, MD;  Location: [facility];  Service: Urology;  Laterality: Left;  . FIBEROPTIC BRONCHOSCOPY  08-20-2005   dr wert   w/ Left lower lobe bx  . PARATHYROIDECTOMY  2003  . RADIOACTIVE PROSTATE SEED IMPLANTS  04-05-2001    dr Diona Fanti [facility]  . TONSILLECTOMY  child  . TRANSURETHRAL RESECTION OF BLADDER TUMOR  11-24-2014   dr Anabel Bene [facility] in [place], [place]  . TRANSURETHRAL RESECTION OF BLADDER TUMOR N/A 04/08/2018   Procedure: TRANSURETHRAL RESECTION OF BLADDER TUMOR (TURBT);  Surgeon: Cleon Gustin, MD;  Location: [facility];   Service: Urology;  Laterality: N/A;  . TRANSURETHRAL RESECTION OF PROSTATE N/A 02/28/2019   Procedure: TRANSURETHRAL RESECTION OF THE PROSTATE (TURP);  Surgeon: Cleon Gustin, MD;  Location: [facility];  Service: Urology;  Laterality: N/A;  30 MINS  . UPPER GI ENDOSCOPY      FAMILY HISTORY History reviewed. No pertinent family history.  SOCIAL HISTORY Social History   Tobacco Use  . Smoking status: Former Smoker    Years: 50.00    Types: Cigarettes    Quit date: 11/03/1984    Years since quitting: 36.2  . Smokeless tobacco: Never Used  Vaping Use  . Vaping Use: Never used  Substance Use Topics  . Alcohol use: Yes    Alcohol/week: 7.0 standard drinks    Types: 7 Glasses of wine per week    Comment: daily wine  . Drug use: No         OPHTHALMIC EXAM: Base Eye  Exam    Visual Acuity (ETDRS)      Right Left   Dist cc CF at 3' 20/50 +1   Dist ph cc NI 20/40   Correction: Glasses       Tonometry (Tonopen, 12:58 PM)      Right Left   Pressure 13 08       Pupils      Pupils Dark Light Shape React APD   Right PERRL 4 3 Round Brisk None   Left PERRL 4 3 Round Brisk None       Visual Fields (Counting fingers)      Left Right    Full Full       Extraocular Movement      Right Left    Full Full       Neuro/Psych    Oriented x3: Yes   Mood/Affect: Normal       Dilation    Both eyes: 1.0% Mydriacyl, 2.5% Phenylephrine @ 1:02 PM        Slit Lamp and Fundus Exam    Slit Lamp Exam      Right Left   Lids/Lashes Normal Normal   Conjunctiva/Sclera White and quiet White and quiet   Cornea Clear Clear   Anterior Chamber Deep and quiet Deep and quiet   Iris Round and reactive Round and reactive   Lens Posterior chamber intraocular lens Posterior chamber intraocular lens   Anterior Vitreous Normal Normal       Fundus Exam      Right Left   Posterior Vitreous Posterior vitreous detachment Posterior vitreous detachment   Disc Normal Normal   C/D Ratio 0.3  0.3   Macula Disciform scar, Pigmented atrophy, Geographic atrophy in the faz Hard drusen, no exudates, no hemorrhage, new macular thickening, Retinal pigment epithelial mottling   Vessels Normal Normal   Periphery Normal Normal          IMAGING AND PROCEDURES  Imaging and Procedures for 01/10/21  OCT, Retina - OU - Both Eyes       Right Eye Quality was good. Scan locations included subfoveal. Central Foveal Thickness: 474. Findings include epiretinal membrane, cystoid macular edema, subretinal scarring, disciform scar.   Left Eye Quality was good. Scan locations included subfoveal. Central Foveal Thickness: 296.   Notes OD with central CME, persistent with severe epiretinal membrane.  Nonetheless outer retinal scarring precludes any attempted surgery at this point in the right eye for epiretinal membrane removal.  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                ASSESSMENT/PLAN:  Exudative age-related macular degeneration of right eye with inactive choroidal neovascularization (HCC) OD, with subfoveal disciform scar and also now geographic atrophy, no extension.  Right epiretinal membrane Moderate severe epiretinal membrane, not the cause of poor vision.  Intermediate stage nonexudative age-related macular degeneration of left eye OS with new progression to wet AMD today  Exudative age-related macular degeneration of left eye with active choroidal neovascularization ([place]) New findings on OCT, serous retinal detachment serous pigment epithelial detachment subfoveal location left eye will need to commence with antivegF promptly  Patient does not have a driver today.  We do recommend injection in his only good eye on a day when he has a driver      [id number]   1. Exudative age-related macular degeneration of right eye with inactive choroidal neovascularization ([place])  U27.2536 OCT, Retina - OU - Both Eyes  2. Right epiretinal membrane  H35.371   3. Intermediate stage nonexudative age-related macular degeneration of left eye  H35.3122   4. Exudative age-related macular degeneration of left eye with active choroidal neovascularization ([place])  H35.3221     1.  Patient needs a driver as his monocular status, with good acuity left eye is now threatened from new wet ARMD. 2.  Return visit 4 days no dilation left eye, injection Avastin OCT  3.  Ophthalmic Meds Ordered this visit:  No orders of the defined types were placed in this encounter.      Return in about 4 days (around 01/14/2021) for OS, AVASTIN OCT, NO DILATE.  There are no Patient Instructions on file for this visit.   Explained the diagnoses, plan, and follow up with the patient and they expressed understanding.  Patient expressed understanding of the importance of proper follow up care.   Clent Demark Emmaleah Meroney M.D. Diseases & Surgery of the Retina and Vitreous Retina & Diabetic [place] 01/10/21     Abbreviations: M myopia (nearsighted); A astigmatism; H hyperopia (farsighted); P presbyopia; Mrx spectacle prescription;  CTL contact lenses; OD right eye; OS left eye; OU both eyes  XT exotropia; ET esotropia; PEK punctate epithelial keratitis; PEE punctate epithelial erosions; DES dry eye syndrome; MGD meibomian gland dysfunction; ATs artificial tears; PFAT's preservative free artificial tears; [place] nuclear sclerotic cataract; PSC posterior subcapsular cataract; ERM epi-retinal membrane; PVD posterior vitreous detachment; RD retinal detachment; DM diabetes mellitus; DR diabetic retinopathy; NPDR non-proliferative diabetic retinopathy; PDR proliferative diabetic retinopathy; CSME clinically significant macular edema; DME diabetic macular edema; dbh dot blot hemorrhages; CWS cotton wool spot; POAG primary open angle glaucoma; C/D cup-to-disc ratio; HVF humphrey visual field; GVF goldmann visual field;  OCT optical coherence tomography; IOP intraocular pressure; BRVO Branch retinal vein occlusion; CRVO central retinal vein occlusion; CRAO central retinal artery occlusion; BRAO branch retinal artery occlusion; RT retinal tear; SB scleral buckle; PPV pars plana vitrectomy; VH Vitreous hemorrhage; PRP panretinal laser photocoagulation; IVK intravitreal kenalog; VMT vitreomacular traction; MH Macular hole;  NVD neovascularization of the disc; NVE neovascularization elsewhere; AREDS age related eye disease study; ARMD age related macular degeneration; POAG primary open angle glaucoma; EBMD epithelial/anterior basement membrane dystrophy; ACIOL anterior chamber intraocular lens; IOL intraocular lens; PCIOL posterior chamber intraocular lens; Phaco/IOL phacoemulsification with intraocular lens placement; [place] photorefractive keratectomy; LASIK laser assisted in situ keratomileusis; HTN hypertension; DM diabetes mellitus; COPD chronic obstructive pulmonary disease

## 2021-01-10 NOTE — Assessment & Plan Note (Signed)
New findings on OCT, serous retinal detachment serous pigment epithelial detachment subfoveal location left eye will need to commence with antivegF promptly  Patient does not have a driver today.  We do recommend injection in his only good eye on a day when he has a driver

## 2021-01-10 NOTE — Assessment & Plan Note (Signed)
OD, with subfoveal disciform scar and also now geographic atrophy, no extension.

## 2021-01-14 ENCOUNTER — Encounter (INDEPENDENT_AMBULATORY_CARE_PROVIDER_SITE_OTHER): Payer: Medicare Other | Admitting: Ophthalmology

## 2021-01-15 ENCOUNTER — Other Ambulatory Visit: Payer: Self-pay

## 2021-01-15 ENCOUNTER — Encounter (INDEPENDENT_AMBULATORY_CARE_PROVIDER_SITE_OTHER): Payer: Self-pay | Admitting: Ophthalmology

## 2021-01-15 ENCOUNTER — Ambulatory Visit (INDEPENDENT_AMBULATORY_CARE_PROVIDER_SITE_OTHER): Payer: Medicare Other | Admitting: Ophthalmology

## 2021-01-15 DIAGNOSIS — H353221 Exudative age-related macular degeneration, left eye, with active choroidal neovascularization: Secondary | ICD-10-CM | POA: Diagnosis not present

## 2021-01-15 MED ORDER — BEVACIZUMAB 2.5 MG/0.1ML IZ SOSY
2.5000 mg | PREFILLED_SYRINGE | INTRAVITREAL | Status: AC | PRN
  Administered 2021-01-15: 2.5 mg via INTRAVITREAL

## 2021-01-15 NOTE — Progress Notes (Signed)
01/15/2021     CHIEF COMPLAINT Patient presents for Retina Follow Up (5 Day Wet AMD f\u OS. Possible Avastin OS. OCT. No Dilation/Pt states vision is weaker, but no changes since last visit.)   HISTORY OF PRESENT ILLNESS: Jesse [name] is a 87 y.o. male who presents to the clinic today for:   HPI    Retina Follow Up    Patient presents with  Wet AMD.  In left eye.  Severity is moderate.  Duration of 5 days.  Since onset it is stable.  I, the attending physician,  performed the HPI with the patient and updated documentation appropriately. Additional comments: 5 Day Wet AMD f\u OS. Possible Avastin OS. OCT. No Dilation Pt states vision is weaker, but no changes since last visit.       Last edited by Tilda Franco on 01/15/2021  1:47 PM. (History)      Referring physician: Virgie Dad, MD [place],  [place] [id number]  HISTORICAL INFORMATION:   Selected notes from the MEDICAL RECORD NUMBER       CURRENT MEDICATIONS: No current outpatient medications on file. (Ophthalmic Drugs)   No current facility-administered medications for this visit. (Ophthalmic Drugs)   Current Outpatient Medications (Other)  Medication Sig  . acetaminophen (TYLENOL) 325 MG tablet Take 325 mg by mouth every 6 (six) hours as needed (for pain).  Marland Kitchen albuterol (VENTOLIN HFA) 108 (90 Base) MCG/ACT inhaler Inhale 2 puffs into the lungs every 6 (six) hours as needed. (Patient taking differently: Inhale 2 puffs into the lungs every 6 (six) hours as needed for shortness of breath or wheezing.)  . chlordiazePOXIDE (LIBRIUM) 10 MG capsule TAKE 1 CAPSULE BY MOUTH THREE TIMES DAILY AS NEEDED (Patient taking differently: Take 10 mg by mouth daily.)  . Cholecalciferol (VITAMIN D3) 1000 units CAPS Take 1,000 Units by mouth daily.  Marland Kitchen HYDROcodone-acetaminophen (NORCO/VICODIN) 5-325 MG tablet Take 1 tablet by mouth every 4 (four) hours as needed for moderate pain or severe pain.  . pravastatin  (PRAVACHOL) 40 MG tablet Take 40 mg by mouth daily. In the morning.  . solifenacin (VESICARE) 10 MG tablet Take 10 mg by mouth daily. In the morning.  . sulfamethoxazole-trimethoprim (BACTRIM) 400-80 MG tablet Take 1 tablet by mouth every 12 (twelve) hours.  . tamsulosin (FLOMAX) 0.4 MG CAPS capsule Take 0.4 mg by mouth daily.  Marland Kitchen umeclidinium-vilanterol (ANORO ELLIPTA) 62.5-25 MCG/INH AEPB Inhale 1 puff into the lungs daily. (Patient taking differently: Inhale 1 puff into the lungs daily as needed (SOB).)   No current facility-administered medications for this visit. (Other)      REVIEW OF SYSTEMS:    ALLERGIES Allergies  Allergen Reactions  . Adhesive [Tape] Other (See Comments)    Tears skin off PAPER TAPE IS OKAY    PAST MEDICAL HISTORY Past Medical History:  Diagnosis Date  . Anxiety   . Asthma    as child  . Bladder cancer [facility]) urologist-  dr Elliot Gault [facility] in [place], [place])   dx 01/ 2016--- post TURBT and post Bladder bx 02-10-2017  . Bladder tumor   . BPH (benign prostatic hyperplasia)   . Frequency of urination   . History of asthma    childhood  . History of external beam radiation therapy    2002  prostate/ pelvis and boost w/ radioactive prostate seed implants   . History of hypercalcemia    s/p  parathyroidectomy 2003  . History of  prostate cancer current urologist-  dr Elliot Gault [facility] in [place], [place])-- per lov note (care everywhere) last PSA undetectable   dx 2002--  urologist-- dr dahlstedt/ oncologist dr Valere Dross---  Gleason 7 out of 10, PSA 4.9---post Radioactive Prostate Seed Implant and External Beam Radiation therapy  . History of skin cancer   . Hyperlipidemia   . Multiple lung nodules    since 2006--- followed by pcp --- dr Maudie Mercury  . Pneumonia   . Renal cyst, left   . Wears glasses   . Wears hearing aid in both ears    Past Surgical History:  Procedure Laterality Date  . CARDIOVASCULAR STRESS TEST  01/01/2004    normal nuclear perfustion study w/ no ischemia/  normal LV function and wall motion, ef 65%  . CATARACT EXTRACTION W/ INTRAOCULAR LENS  IMPLANT, BILATERAL  2010  . COLONOSCOPY    . CYSTO/  BILATERAL RETROGRADE PYELOGRAM/  BLADDER BX'S AND FULGERATION  02-10-2017   dr Anabel Bene [facility] in [place], [place]  . CYSTOSCOPY W/ RETROGRADES Bilateral 07/27/2017   Procedure: CYSTOSCOPY,SELECTIVE CYTOLOGIES,RETROGRADE PYELOGRAM;  Surgeon: Cleon Gustin, MD;  Location: [facility];  Service: Urology;  Laterality: Bilateral;  . CYSTOSCOPY W/ RETROGRADES Bilateral 12/08/2017   Procedure: CYSTOSCOPY WITH RETROGRADE PYELOGRAM;  Surgeon: Cleon Gustin, MD;  Location: [facility];  Service: Urology;  Laterality: Bilateral;  . CYSTOSCOPY WITH BIOPSY N/A 07/27/2017   Procedure: CYSTOSCOPY WITH BIOPSY OF BLADDER AND PROSTATIC URETHRA;  Surgeon: Cleon Gustin, MD;  Location: [facility];  Service: Urology;  Laterality: N/A;  . CYSTOSCOPY WITH BIOPSY Bilateral 12/08/2017   Procedure: CYSTOSCOPY WITH RENAL WASHINGS;  Surgeon: Cleon Gustin, MD;  Location: [facility];  Service: Urology;  Laterality: Bilateral;  . CYSTOSCOPY WITH RETROGRADE PYELOGRAM, URETEROSCOPY AND STENT PLACEMENT Left 11/26/2020   Procedure: CYSTOSCOPY WITH LEFT RETROGRADE PYELOGRAM, URETEROSCOPY, BRUSH BIOPSY, TURBT;  Surgeon: Franchot Gallo, MD;  Location: [facility];  Service: Urology;  Laterality: Left;  . FIBEROPTIC BRONCHOSCOPY  08-20-2005   dr wert   w/ Left lower lobe bx  . PARATHYROIDECTOMY  2003  . RADIOACTIVE PROSTATE SEED IMPLANTS  04-05-2001    dr Diona Fanti [facility]  . TONSILLECTOMY  child  . TRANSURETHRAL RESECTION OF BLADDER TUMOR  11-24-2014   dr Anabel Bene [facility] in [place], [place]  . TRANSURETHRAL RESECTION OF BLADDER TUMOR N/A 04/08/2018   Procedure: TRANSURETHRAL RESECTION OF BLADDER TUMOR (TURBT);  Surgeon: Cleon Gustin, MD;   Location: [facility];  Service: Urology;  Laterality: N/A;  . TRANSURETHRAL RESECTION OF PROSTATE N/A 02/28/2019   Procedure: TRANSURETHRAL RESECTION OF THE PROSTATE (TURP);  Surgeon: Cleon Gustin, MD;  Location: [facility];  Service: Urology;  Laterality: N/A;  30 MINS  . UPPER GI ENDOSCOPY      FAMILY HISTORY History reviewed. No pertinent family history.  SOCIAL HISTORY Social History   Tobacco Use  . Smoking status: Former Smoker    Years: 50.00    Types: Cigarettes    Quit date: 11/03/1984    Years since quitting: 36.2  . Smokeless tobacco: Never Used  Vaping Use  . Vaping Use: Never used  Substance Use Topics  . Alcohol use: Yes    Alcohol/week: 7.0 standard drinks    Types: 7 Glasses of wine per week    Comment: daily wine  . Drug use: No         OPHTHALMIC EXAM: Base Eye Exam  Visual Acuity (ETDRS)      Right Left   Dist cc E Card @ 5' 20/40 -1   Dist ph cc 20/400 20/30 -1   Correction: Glasses       Tonometry (Tonopen, 1:51 PM)      Right Left   Pressure 18 13       Pupils      Pupils Dark Light Shape React APD   Right PERRL 3 2 Round Brisk None   Left PERRL 3 2 Round Brisk None       Neuro/Psych    Oriented x3: Yes   Mood/Affect: Normal        Slit Lamp and Fundus Exam    Slit Lamp Exam      Right Left   Lids/Lashes Normal Normal   Conjunctiva/Sclera White and quiet White and quiet   Cornea Clear Clear   Anterior Chamber Deep and quiet Deep and quiet   Iris Round and reactive Round and reactive   Lens Posterior chamber intraocular lens Posterior chamber intraocular lens   Anterior Vitreous Normal Normal          IMAGING AND PROCEDURES  Imaging and Procedures for 01/15/21  OCT, Retina - OU - Both Eyes       Right Eye Quality was good. Scan locations included subfoveal. Central Foveal Thickness: 474. Findings include epiretinal membrane, cystoid macular edema, subretinal scarring, disciform scar.   Left  Eye Quality was good. Scan locations included subfoveal. Central Foveal Thickness: 356.   Notes OD with central CME, persistent with severe epiretinal membrane.  Nonetheless outer retinal scarring precludes any attempted surgery at this point in the right eye for epiretinal membrane removal.  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       Intravitreal Injection, Pharmacologic Agent - OS - Left Eye       Time Out 01/15/2021. 2:09 PM. Confirmed correct patient, procedure, site, and patient consented.   Anesthesia Topical anesthesia was used. Anesthetic medications included Akten 3.5%.   Procedure Preparation included Tobramycin 0.3%, Ofloxacin , 10% betadine to eyelids, 5% betadine to ocular surface. A 30 gauge needle was used.   Injection:  2.5 mg Bevacizumab (AVASTIN) 2.5mg /0.43mL SOSY   NDC: 93818-299-37, Lot: [id number]   Route: Intravitreal, Site: Left Eye  Post-op Post injection exam found visual acuity of at least counting fingers. The patient tolerated the procedure well. There were no complications. The patient received written and verbal post procedure care education. Post injection medications were not given.                 ASSESSMENT/PLAN:  Exudative age-related macular degeneration of left eye with active choroidal neovascularization ([place]) Commence with intravitreal Avastin No. 1 OS now      ICD-10-CM   1. Exudative age-related macular degeneration of left eye with active choroidal neovascularization (HCC)  H35.3221 OCT, Retina - OU - Both Eyes    Intravitreal Injection, Pharmacologic Agent - OS - Left Eye    bevacizumab (AVASTIN) SOSY 2.5 mg    1.  Commence with treatment of new onset wet ARMD OS, Avastin No. 1 injection  2.  Dilate OS next, likely injection Avastin OS  3.  Ophthalmic Meds Ordered this visit:  Meds ordered this encounter  Medications  .  bevacizumab (AVASTIN) SOSY 2.5 mg       Return in about 5 weeks (around 02/19/2021) for dilate, OS, AVASTIN OCT.  There are no Patient Instructions on file for this visit.   Explained the diagnoses, plan, and follow up with the patient and they expressed understanding.  Patient expressed understanding of the importance of proper follow up care.   Clent Demark Rankin M.D. Diseases & Surgery of the Retina and Vitreous [facility]ina [facility] Di[facility] [place] 01/15/21     Abbreviations: M myopia (nearsighted); A astigmatism; H hyperopia (farsighted); P presbyopia; Mrx spectacle prescription;  CTL contact lenses; OD right eye; OS left eye; OU both eyes  XT exotropia; ET esotropia; PEK punctate epithelial keratitis; PEE punctate epithelial erosions; DES dry eye syndrome; MGD meibomian gland dysfunction; ATs artificial tears; PFAT's preservative free artificial tears; [place] nuclear sclerotic cataract; PSC posterior subcapsular cataract; ERM epi-retinal membrane; PVD posterior vitreous detachment; RD retinal detachment; DM diabetes mellitus; DR diabetic retinopathy; NPDR non-proliferative diabetic retinopathy; PDR proliferative diabetic retinopathy; CSME clinically significant macular edema; DME diabetic macular edema; dbh dot blot hemorrhages; CWS cotton wool spot; POAG primary open angle glaucoma; C/D cup-to-disc ratio; HVF humphrey visual field; GVF goldmann visual field; OCT optical coherence tomography; IOP intraocular pressure; BRVO Branch retinal vein occlusion; CRVO central retinal vein occlusion; CRAO central retinal artery occlusion; BRAO branch retinal artery occlusion; RT retinal tear; SB scleral buckle; PPV pars plana vitrectomy; VH Vitreous hemorrhage; PRP panretinal laser photocoagulation; IVK intravitreal kenalog; VMT vitreomacular traction; MH Macular hole;  NVD neovascularization of the disc; NVE neovascularization elsewhere; AREDS age related eye disease study; ARMD age related macular degeneration;  POAG primary open angle glaucoma; EBMD epithelial/anterior basement membrane dystrophy; ACIOL anterior chamber intraocular lens; IOL intraocular lens; PCIOL posterior chamber intraocular lens; Phaco/IOL phacoemulsification with intraocular lens placement; [place] photorefractive keratectomy; LASIK laser assisted in situ keratomileusis; HTN hypertension; DM diabetes mellitus; COPD chronic obstructive pulmonary disease

## 2021-01-15 NOTE — Assessment & Plan Note (Signed)
Commence with intravitreal Avastin No. 1 OS now

## 2021-02-01 ENCOUNTER — Other Ambulatory Visit: Payer: Self-pay | Admitting: Internal Medicine

## 2021-02-01 NOTE — Telephone Encounter (Signed)
Patient medication has error. Medication routed to PCP Virgie Dad, MD  to edit.

## 2021-02-05 ENCOUNTER — Other Ambulatory Visit: Payer: Self-pay | Admitting: Gastroenterology

## 2021-02-11 ENCOUNTER — Other Ambulatory Visit: Payer: Self-pay | Admitting: *Deleted

## 2021-02-11 MED ORDER — PRAVASTATIN SODIUM 40 MG PO TABS: 40.0000 mg | ORAL_TABLET | Freq: Every day | ORAL | 1 refills | Status: DC

## 2021-02-11 NOTE — Telephone Encounter (Signed)
Patient requested refill

## 2021-02-19 ENCOUNTER — Encounter (INDEPENDENT_AMBULATORY_CARE_PROVIDER_SITE_OTHER): Payer: Self-pay | Admitting: Ophthalmology

## 2021-02-19 ENCOUNTER — Ambulatory Visit (INDEPENDENT_AMBULATORY_CARE_PROVIDER_SITE_OTHER): Payer: Medicare Other | Admitting: Ophthalmology

## 2021-02-19 ENCOUNTER — Other Ambulatory Visit: Payer: Self-pay

## 2021-02-19 DIAGNOSIS — H353221 Exudative age-related macular degeneration, left eye, with active choroidal neovascularization: Secondary | ICD-10-CM

## 2021-02-19 MED ORDER — BEVACIZUMAB 2.5 MG/0.1ML IZ SOSY
2.5000 mg | PREFILLED_SYRINGE | INTRAVITREAL | Status: AC | PRN
Start: 2021-02-19 — End: 2021-02-19
  Administered 2021-02-19: 2.5 mg via INTRAVITREAL

## 2021-02-19 NOTE — Assessment & Plan Note (Signed)
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  We will repeat injection today

## 2021-02-19 NOTE — Progress Notes (Signed)
02/19/2021     CHIEF COMPLAINT Patient presents for Retina Follow Up (5 Wk F/U OS, poss Avastin OS//Pt denies noticeable changes to [place] OU since last visit. Pt denies ocular pain, flashes of light, or floaters OU. //)   HISTORY OF PRESENT ILLNESS: Jesse [name] is a 87 y.o. male who presents to the clinic today for:   HPI    Retina Follow Up    Patient presents with  Wet AMD.  In left eye.  This started 5 weeks ago.  Severity is moderate.  Duration of 5 weeks.  Since onset it is stable. Additional comments: 5 Wk F/U OS, poss Avastin OS  Pt denies noticeable changes to [place] OU since last visit. Pt denies ocular pain, flashes of light, or floaters OU.          Last edited by Rockie Neighbours, [place] on 02/19/2021  2:48 PM. (History)      Referring physician: Virgie Dad, MD [place],  [place] [id number]  HISTORICAL INFORMATION:   Selected notes from the MEDICAL RECORD NUMBER       CURRENT MEDICATIONS: No current outpatient medications on file. (Ophthalmic Drugs)   No current facility-administered medications for this visit. (Ophthalmic Drugs)   Current Outpatient Medications (Other)  Medication Sig  . acetaminophen (TYLENOL) 325 MG tablet Take 325 mg by mouth every 6 (six) hours as needed (for pain).  Marland Kitchen albuterol (VENTOLIN HFA) 108 (90 Base) MCG/ACT inhaler Inhale 2 puffs into the lungs every 6 (six) hours as needed. (Patient taking differently: Inhale 2 puffs into the lungs every 6 (six) hours as needed for shortness of breath or wheezing.)  . chlordiazePOXIDE (LIBRIUM) 10 MG capsule Take 1 capsule (10 mg total) by mouth daily.  . Cholecalciferol (VITAMIN D3) 1000 units CAPS Take 1,000 Units by mouth daily.  Marland Kitchen HYDROcodone-acetaminophen (NORCO/VICODIN) 5-325 MG tablet Take 1 tablet by mouth every 4 (four) hours as needed for moderate pain or severe pain.  . pravastatin (PRAVACHOL) 40 MG tablet Take 1 tablet (40 mg total) by mouth daily. In the morning.  .  solifenacin (VESICARE) 10 MG tablet Take 10 mg by mouth daily. In the morning.  . sulfamethoxazole-trimethoprim (BACTRIM) 400-80 MG tablet Take 1 tablet by mouth every 12 (twelve) hours.  . tamsulosin (FLOMAX) 0.4 MG CAPS capsule Take 0.4 mg by mouth daily.  Marland Kitchen umeclidinium-vilanterol (ANORO ELLIPTA) 62.5-25 MCG/INH AEPB Inhale 1 puff into the lungs daily. (Patient taking differently: Inhale 1 puff into the lungs daily as needed (SOB).)   No current facility-administered medications for this visit. (Other)      REVIEW OF SYSTEMS:    ALLERGIES Allergies  Allergen Reactions  . Adhesive [Tape] Other (See Comments)    Tears skin off PAPER TAPE IS OKAY    PAST MEDICAL HISTORY Past Medical History:  Diagnosis Date  . Anxiety   . Asthma    as child  . Bladder cancer [facility]) urologist-  dr Elliot Gault [facility] in [place], [place])   dx 01/ 2016--- post TURBT and post Bladder bx 02-10-2017  . Bladder tumor   . BPH (benign prostatic hyperplasia)   . Frequency of urination   . History of asthma    childhood  . History of external beam radiation therapy    2002  prostate/ pelvis and boost w/ radioactive prostate seed implants   . History of hypercalcemia    s/p  parathyroidectomy 2003  . History of prostate cancer current urologist-  dr Elliot Gault [facility] in [place], [place])-- per lov note (care everywhere) last PSA undetectable   dx 2002--  urologist-- dr dahlstedt/ oncologist dr Valere Dross---  Gleason 7 out of 10, PSA 4.9---post Radioactive Prostate Seed Implant and External Beam Radiation therapy  . History of skin cancer   . Hyperlipidemia   . Multiple lung nodules    since 2006--- followed by pcp --- dr Maudie Mercury  . Pneumonia   . Renal cyst, left   . Wears glasses   . Wears hearing aid in both ears    Past Surgical History:  Procedure Laterality Date  . CARDIOVASCULAR STRESS TEST  01/01/2004   normal nuclear perfustion study w/ no ischemia/  normal LV function and  wall motion, ef 65%  . CATARACT EXTRACTION W/ INTRAOCULAR LENS  IMPLANT, BILATERAL  2010  . COLONOSCOPY    . CYSTO/  BILATERAL RETROGRADE PYELOGRAM/  BLADDER BX'S AND FULGERATION  02-10-2017   dr Anabel Bene [facility] in [place], [place]  . CYSTOSCOPY W/ RETROGRADES Bilateral 07/27/2017   Procedure: CYSTOSCOPY,SELECTIVE CYTOLOGIES,RETROGRADE PYELOGRAM;  Surgeon: Cleon Gustin, MD;  Location[facility];  Service: Urology;  Laterality: Bilateral;  . CYSTOSCOPY W/ RETROGRADES Bilateral 12/08/2017   Procedure: CYSTOSCOPY WITH RETROGRADE PYELOGRAM;  Surgeon: Cleon Gustin, MD;  Location: [facility];  Service: Urology;  Laterality: Bilateral;  . CYSTOSCOPY WITH BIOPSY N/A 07/27/2017   Procedure: CYSTOSCOPY WITH BIOPSY OF BLADDER AND PROSTATIC URETHRA;  Surgeon: Cleon Gustin, MD;  Location: [facility];  Service: Urology;  Laterality: N/A;  . CYSTOSCOPY WITH BIOPSY Bilateral 12/08/2017   Procedure: CYSTOSCOPY WITH RENAL WASHINGS;  Surgeon: Cleon Gustin, MD;  Location: [facility];  Service: Urology;  Laterality: Bilateral;  . CYSTOSCOPY WITH RETROGRADE PYELOGRAM, URETEROSCOPY AND STENT PLACEMENT Left 11/26/2020   Procedure: CYSTOSCOPY WITH LEFT RETROGRADE PYELOGRAM, URETEROSCOPY, BRUSH BIOPSY, TURBT;  Surgeon: Franchot Gallo, MD;  Location[facility];  Service: Urology;  Laterality: Left;  . FIBEROPTIC BRONCHOSCOPY  08-20-2005   dr wert   w/ Left lower lobe bx  . PARATHYROIDECTOMY  2003  . RADIOACTIVE PROSTATE SEED IMPLANTS  04-05-2001    dr Diona Fanti [facility]  . TONSILLECTOMY  child  . TRANSURETHRAL RESECTION OF BLADDER TUMOR  11-24-2014   dr Anabel Bene [facility] in [place], [place]  . TRANSURETHRAL RESECTION OF BLADDER TUMOR N/A 04/08/2018   Procedure: TRANSURETHRAL RESECTION OF BLADDER TUMOR (TURBT);  Surgeon: Cleon Gustin, MD;  Location: [facility];  Service: Urology;  Laterality:  N/A;  . TRANSURETHRAL RESECTION OF PROSTATE N/A 02/28/2019   Procedure: TRANSURETHRAL RESECTION OF THE PROSTATE (TURP);  Surgeon: Cleon Gustin, MD;  [facility];  Service: Urology;  Laterality: N/A;  30 MINS  . UPPER GI ENDOSCOPY      FAMILY HISTORY History reviewed. No pertinent family history.  SOCIAL HISTORY Social History   Tobacco Use  . Smoking status: Former Smoker    Years: 50.00    Types: Cigarettes    Quit date: 11/03/1984    Years since quitting: 36.3  . Smokeless tobacco: Never Used  Vaping Use  . Vaping Use: Never used  Substance Use Topics  . Alcohol use: Yes    Alcohol/week: 7.0 standard drinks    Types: 7 Glasses of wine per week    Comment: daily wine  . Drug use: No         OPHTHALMIC EXAM: Base Eye Exam    Visual Acuity (  ETDRS)      Right Left   Dist cc CF at 3' 20/30 -2   Dist ph cc 20/400 20/30 +2   Correction: Glasses       Tonometry (Tonopen, 2:53 PM)      Right Left   Pressure 16 11       Pupils      Pupils Dark Light Shape React APD   Right PERRL 4 3 Round Brisk None   Left PERRL 4 3 Round Brisk None       Visual Fields (Counting fingers)      Left Right    Full Full       Extraocular Movement      Right Left    Full Full       Neuro/Psych    Oriented x3: Yes   Mood/Affect: Normal       Dilation    Left eye: 1.0% Mydriacyl, 2.5% Phenylephrine @ 2:53 PM        Slit Lamp and Fundus Exam    Slit Lamp Exam      Right Left   Lids/Lashes Normal Normal   Conjunctiva/Sclera White and quiet White and quiet   Cornea Clear Clear   Anterior Chamber Deep and quiet Deep and quiet   Iris Round and reactive Round and reactive   Lens Posterior chamber intraocular lens Posterior chamber intraocular lens   Anterior Vitreous Normal Normal       Fundus Exam      Right Left   Posterior Vitreous  Posterior vitreous detachment   Disc  Normal   C/D Ratio  0.3   Macula  Hard drusen, no exudates, no hemorrhage, new  macular thickening, Retinal pigment epithelial mottling   Vessels  Normal   Periphery  Normal          IMAGING AND PROCEDURES  Imaging and Procedures for 02/19/21  OCT, Retina - OU - Both Eyes       Right Eye Quality was good. Scan locations included subfoveal. Central Foveal Thickness: 470. Progression has been stable. Findings include epiretinal membrane, cystoid macular edema, subretinal scarring, disciform scar.   Left Eye Quality was good. Scan locations included subfoveal. Central Foveal Thickness: 237. Progression has improved. Findings include subretinal fluid, pigment epithelial detachment.   Notes OD with central CME, persistent with severe epiretinal membrane.  Nonetheless outer retinal scarring precludes any attempted surgery at this point in the right eye for epiretinal membrane removal.  Vastly improved improved macular anatomy with smaller PED and smaller amount of subretinal fluid In the foveal location status post injection #1 Avastin.  We will repeat injection today       Intravitreal Injection, Pharmacologic Agent - OS - Left Eye       Time Out 02/19/2021. 3:26 PM. Confirmed correct patient, procedure, site, and patient consented.   Anesthesia Topical anesthesia was used. Anesthetic medications included Akten 3.5%.   Procedure Preparation included Tobramycin 0.3%, Ofloxacin , 10% betadine to eyelids, 5% betadine to ocular surface. A 30 gauge needle was used.   Injection:  2.5 mg Bevacizumab (AVASTIN) 2.5mg /0.16mL SOSY   NDC: 70623-762-83, Lot: [id number]   Route: Intravitreal, Site: Left Eye  Post-op Post injection exam found visual acuity of at least counting fingers. The patient tolerated the procedure well. There were no complications. The patient received written and verbal post procedure care education. Post injection medications were not given.  ASSESSMENT/PLAN:  Exudative age-related macular degeneration of left eye with  active choroidal neovascularization ([place]) Recent onset new CNVM with pigment epithelial detachment, large subretinal fluid accumulation much improved 1 month post injection Avastin.  We will repeat injection today      ICD-10-CM   1. Exudative age-related macular degeneration of left eye with active choroidal neovascularization (HCC)  H35.3221 OCT, Retina - OU - Both Eyes    Intravitreal Injection, Pharmacologic Agent - OS - Left Eye    bevacizumab (AVASTIN) SOSY 2.5 mg    1.  Vastly improved CNVM OS postinjection #1 we will repeat injection today and again in 5 to 6-week  2.  3.  Ophthalmic Meds Ordered this visit:  Meds ordered this encounter  Medications  . bevacizumab (AVASTIN) SOSY 2.5 mg       Return for RV 5 to 6 weeks, dilate, OS, AVASTIN OCT.  There are no Patient Instructions on file for this visit.   Explained the diagnoses, plan, and follow up with the patient and they expressed understanding.  Patient expressed understanding of the importance of proper follow up care.   Clent Demark Lois Ostrom M.D. Diseases & Surgery of the Retina and Vitreous Retina & Diabetic [place] 02/19/21     Abbreviations: M myopia (nearsighted); A astigmatism; H hyperopia (farsighted); P presbyopia; Mrx spectacle prescription;  CTL contact lenses; OD right eye; OS left eye; OU both eyes  XT exotropia; ET esotropia; PEK punctate epithelial keratitis; PEE punctate epithelial erosions; DES dry eye syndrome; MGD meibomian gland dysfunction; ATs artificial tears; PFAT's preservative free artificial tears; [place] nuclear sclerotic cataract; PSC posterior subcapsular cataract; ERM epi-retinal membrane; PVD posterior vitreous detachment; RD retinal detachment; DM diabetes mellitus; DR diabetic retinopathy; NPDR non-proliferative diabetic retinopathy; PDR proliferative diabetic retinopathy; CSME clinically significant macular edema; DME diabetic macular edema; dbh dot blot hemorrhages; CWS cotton wool spot;  POAG primary open angle glaucoma; C/D cup-to-disc ratio; HVF humphrey visual field; GVF goldmann visual field; OCT optical coherence tomography; IOP intraocular pressure; BRVO Branch retinal vein occlusion; CRVO central retinal vein occlusion; CRAO central retinal artery occlusion; BRAO branch retinal artery occlusion; RT retinal tear; SB scleral buckle; PPV pars plana vitrectomy; VH Vitreous hemorrhage; PRP panretinal laser photocoagulation; IVK intravitreal kenalog; VMT vitreomacular traction; MH Macular hole;  NVD neovascularization of the disc; NVE neovascularization elsewhere; AREDS age related eye disease study; ARMD age related macular degeneration; POAG primary open angle glaucoma; EBMD epithelial/anterior basement membrane dystrophy; ACIOL anterior chamber intraocular lens; IOL intraocular lens; PCIOL posterior chamber intraocular lens; Phaco/IOL phacoemulsification with intraocular lens placement; [place] photorefractive keratectomy; LASIK laser assisted in situ keratomileusis; HTN hypertension; DM diabetes mellitus; COPD chronic obstructive pulmonary disease

## 2021-03-21 ENCOUNTER — Other Ambulatory Visit: Payer: Self-pay

## 2021-03-21 ENCOUNTER — Non-Acute Institutional Stay (INDEPENDENT_AMBULATORY_CARE_PROVIDER_SITE_OTHER): Payer: Medicare Other

## 2021-03-21 DIAGNOSIS — E785 Hyperlipidemia, unspecified: Secondary | ICD-10-CM

## 2021-03-21 DIAGNOSIS — R5381 Other malaise: Secondary | ICD-10-CM

## 2021-03-21 DIAGNOSIS — I959 Hypotension, unspecified: Secondary | ICD-10-CM

## 2021-03-21 DIAGNOSIS — R531 Weakness: Secondary | ICD-10-CM

## 2021-03-22 LAB — COMPLETE METABOLIC PANEL WITH GFR
AG Ratio: 1.8 (calc) (ref 1.0–2.5)
ALT: 8 U/L — ABNORMAL LOW (ref 9–46)
AST: 12 U/L (ref 10–35)
Albumin: 4.2 g/dL (ref 3.6–5.1)
Alkaline phosphatase (APISO): 76 U/L (ref 35–144)
BUN/Creatinine Ratio: 13 (calc) (ref 6–22)
BUN: 19 mg/dL (ref 7–25)
CO2: 23 mmol/L (ref 20–32)
Calcium: 9.5 mg/dL (ref 8.6–10.3)
Chloride: 102 mmol/L (ref 98–110)
Creat: 1.51 mg/dL — ABNORMAL HIGH (ref 0.70–1.11)
GFR, Est African American: 48 mL/min/{1.73_m2} — ABNORMAL LOW (ref >=60)
GFR, Est Non African American: 41 mL/min/{1.73_m2} — ABNORMAL LOW (ref >=60)
Globulin: 2.4 g/dL (calc) (ref 1.9–3.7)
Glucose, Bld: 93 mg/dL (ref 65–99)
Potassium: 4.1 mmol/L (ref 3.5–5.3)
Sodium: 139 mmol/L (ref 135–146)
Total Bilirubin: 0.5 mg/dL (ref 0.2–1.2)
Total Protein: 6.6 g/dL (ref 6.1–8.1)

## 2021-03-22 LAB — LIPID PANEL
Cholesterol: 163 mg/dL (ref <200)
HDL: 65 mg/dL (ref >=40)
LDL Cholesterol (Calc): 79 mg/dL (calc)
Non-HDL Cholesterol (Calc): 98 mg/dL (calc) (ref <130)
Total CHOL/HDL Ratio: 2.5 (calc) (ref <5.0)
Triglycerides: 100 mg/dL (ref <150)

## 2021-03-22 LAB — CBC WITH DIFFERENTIAL/PLATELET
Absolute Monocytes: 946 cells/uL (ref 200–950)
Basophils Absolute: 33 cells/uL (ref 0–200)
Basophils Relative: 0.4 %
Eosinophils Absolute: 108 cells/uL (ref 15–500)
Eosinophils Relative: 1.3 %
HCT: 43.3 % (ref 38.5–50.0)
Hemoglobin: 14.4 g/dL (ref 13.2–17.1)
Lymphs Abs: 2390 cells/uL (ref 850–3900)
MCH: 30.4 pg (ref 27.0–33.0)
MCHC: 33.3 g/dL (ref 32.0–36.0)
MCV: 91.5 fL (ref 80.0–100.0)
MPV: 9.3 fL (ref 7.5–12.5)
Monocytes Relative: 11.4 %
Neutro Abs: 4822 cells/uL (ref 1500–7800)
Neutrophils Relative %: 58.1 %
Platelets: 222 10*3/uL (ref 140–400)
RBC: 4.73 10*6/uL (ref 4.20–5.80)
RDW: 13 % (ref 11.0–15.0)
Total Lymphocyte: 28.8 %
WBC: 8.3 10*3/uL (ref 3.8–10.8)

## 2021-03-22 LAB — TSH: TSH: 1.57 mIU/L (ref 0.40–4.50)

## 2021-03-26 NOTE — Progress Notes (Signed)
Lab Patient

## 2021-03-27 ENCOUNTER — Encounter: Payer: Self-pay | Admitting: Internal Medicine

## 2021-03-27 ENCOUNTER — Other Ambulatory Visit: Payer: Self-pay

## 2021-03-27 ENCOUNTER — Telehealth: Payer: Self-pay | Admitting: Student

## 2021-03-27 ENCOUNTER — Non-Acute Institutional Stay: Payer: Medicare Other | Admitting: Internal Medicine

## 2021-03-27 VITALS — BP 104/72 | HR 113 | Temp 97.4°F | Ht 65.5 in | Wt 109.9 lb

## 2021-03-27 DIAGNOSIS — R3989 Other symptoms and signs involving the genitourinary system: Secondary | ICD-10-CM

## 2021-03-27 DIAGNOSIS — R5381 Other malaise: Secondary | ICD-10-CM

## 2021-03-27 DIAGNOSIS — C689 Malignant neoplasm of urinary organ, unspecified: Secondary | ICD-10-CM

## 2021-03-27 DIAGNOSIS — E785 Hyperlipidemia, unspecified: Secondary | ICD-10-CM

## 2021-03-27 DIAGNOSIS — R634 Abnormal weight loss: Secondary | ICD-10-CM | POA: Diagnosis not present

## 2021-03-27 DIAGNOSIS — J439 Emphysema, unspecified: Secondary | ICD-10-CM

## 2021-03-27 MED ORDER — OXYCODONE HCL 5 MG PO CAPS: 5.0000 mg | ORAL_CAPSULE | Freq: Four times a day (QID) | ORAL | 0 refills | Status: DC | PRN

## 2021-03-27 NOTE — Telephone Encounter (Signed)
Palliative NP left message to schedule visit. Awaiting return call.

## 2021-03-28 ENCOUNTER — Other Ambulatory Visit: Payer: Self-pay

## 2021-03-28 ENCOUNTER — Telehealth: Payer: Self-pay

## 2021-03-28 ENCOUNTER — Ambulatory Visit (INDEPENDENT_AMBULATORY_CARE_PROVIDER_SITE_OTHER): Payer: Medicare Other | Admitting: Ophthalmology

## 2021-03-28 ENCOUNTER — Encounter (INDEPENDENT_AMBULATORY_CARE_PROVIDER_SITE_OTHER): Payer: Self-pay | Admitting: Ophthalmology

## 2021-03-28 DIAGNOSIS — H353212 Exudative age-related macular degeneration, right eye, with inactive choroidal neovascularization: Secondary | ICD-10-CM

## 2021-03-28 DIAGNOSIS — H35371 Puckering of macula, right eye: Secondary | ICD-10-CM

## 2021-03-28 DIAGNOSIS — H353221 Exudative age-related macular degeneration, left eye, with active choroidal neovascularization: Secondary | ICD-10-CM | POA: Diagnosis not present

## 2021-03-28 MED ORDER — BEVACIZUMAB 2.5 MG/0.1ML IZ SOSY
2.5000 mg | PREFILLED_SYRINGE | INTRAVITREAL | Status: AC | PRN
  Administered 2021-03-28: 2.5 mg via INTRAVITREAL

## 2021-03-28 NOTE — Progress Notes (Signed)
Location:  [place] of Service:  Clinic (12)  Provider:   Code Status:  Goals of Care:  Advanced Directives 11/26/2020  Does Patient Have a Medical Advance Directive? Yes  Type of Paramedic of [place];Living will  Does patient want to make changes to medical advance directive? No - Patient declined  Copy of [place] in Chart? No - copy requested  Would patient like information on creating a medical advance directive? -     Chief Complaint  Patient presents with  . Medical Management of Chronic Issues    Patient returns to the clinic for 4 month follow up.     HPI: Patient is a 87 y.o. male seen today for medical management of chronic diseases.    Patient hasPMHCovid Pneumonia BPH and h/o Bladder cancer, HLD and Depressionand COPD Also wasadmitted to Hospital from 6/30-7/2 forCAP and Pleural Effusion  Was having Hematuria CT scan  showed Multifocal Urothelial Cancer Had Cystoscopy on 01/24.  I do not have notes after that procedure but patient says his hematuria has stopped but Urology has told him no more treatment. He contineus to have Pain in his Bladder region. Pain is so severe that he sometimes cannot get up Urology finally agreed to start him on Oxycodone which is helping He wants me ta take over the prescription as they are refusing to Renew it Also incrased Frequeny sometimes every 1-2 hours esepcially at night Also Very tired. Has lost 5lbs. No Appetite. SOB on Exertion  Past Medical History:  Diagnosis Date  . Anxiety   . Asthma    as child  . Bladder cancer [facility]) urologist-  dr Elliot Gault [facility] in [place], [place])   dx 01/ 2016--- post TURBT and post Bladder bx 02-10-2017  . Bladder tumor   . BPH (benign prostatic hyperplasia)   . Frequency of urination   . History of asthma    childhood  . History of external beam radiation therapy    2002  prostate/ pelvis and boost w/  radioactive prostate seed implants   . History of hypercalcemia    s/p  parathyroidectomy 2003  . History of prostate cancer current urologist-  dr Elliot Gault [facility] in [place], [place])-- per lov note (care everywhere) last PSA undetectable   dx 2002--  urologist-- dr dahlstedt/ oncologist dr Valere Dross---  Gleason 7 out of 10, PSA 4.9---post Radioactive Prostate Seed Implant and External Beam Radiation therapy  . History of skin cancer   . Hyperlipidemia   . Multiple lung nodules    since 2006--- followed by pcp --- dr Maudie Mercury  . Pneumonia   . Renal cyst, left   . Wears glasses   . Wears hearing aid in both ears     Past Surgical History:  Procedure Laterality Date  . CARDIOVASCULAR STRESS TEST  01/01/2004   normal nuclear perfustion study w/ no ischemia/  normal LV function and wall motion, ef 65%  . CATARACT EXTRACTION W/ INTRAOCULAR LENS  IMPLANT, BILATERAL  2010  . COLONOSCOPY    . CYSTO/  BILATERAL RETROGRADE PYELOGRAM/  BLADDER BX'S AND FULGERATION  02-10-2017   dr Anabel Bene [facility] in [place], [place]  . CYSTOSCOPY W/ RETROGRADES Bilateral 07/27/2017   Procedure: CYSTOSCOPY,SELECTIVE CYTOLOGIES,RETROGRADE PYELOGRAM;  Surgeon: Cleon Gustin, MD;  Location: [facility];  Service: Urology;  Laterality: Bilateral;  . CYSTOSCOPY W/ RETROGRADES Bilateral 12/08/2017   Procedure: CYSTOSCOPY WITH RETROGRADE PYELOGRAM;  Surgeon:  McKenzie, Candee Furbish, MD;  Location[facility];  Service: Urology;  Laterality: Bilateral;  . CYSTOSCOPY WITH BIOPSY N/A 07/27/2017   Procedure: CYSTOSCOPY WITH BIOPSY OF BLADDER AND PROSTATIC URETHRA;  Surgeon: Cleon Gustin, MD;  Location: [facility];  Service: Urology;  Laterality: N/A;  . CYSTOSCOPY WITH BIOPSY Bilateral 12/08/2017   Procedure: CYSTOSCOPY WITH RENAL WASHINGS;  Surgeon: Cleon Gustin, MD;  Location[facility];  Service: Urology;  Laterality: Bilateral;  . CYSTOSCOPY  WITH RETROGRADE PYELOGRAM, URETEROSCOPY AND STENT PLACEMENT Left 11/26/2020   Procedure: CYSTOSCOPY WITH LEFT RETROGRADE PYELOGRAM, URETEROSCOPY, BRUSH BIOPSY, TURBT;  Surgeon: Franchot Gallo, MD;  [facility];  Service: Urology;  Laterality: Left;  . FIBEROPTIC BRONCHOSCOPY  08-20-2005   dr wert   w/ Left lower lobe bx  . PARATHYROIDECTOMY  2003  . RADIOACTIVE PROSTATE SEED IMPLANTS  04-05-2001    dr Diona Fanti [facility]  . TONSILLECTOMY  child  . TRANSURETHRAL RESECTION OF BLADDER TUMOR  11-24-2014   dr Anabel Bene [facility] in [place], [place]  . TRANSURETHRAL RESECTION OF BLADDER TUMOR N/A 04/08/2018   Procedure: TRANSURETHRAL RESECTION OF BLADDER TUMOR (TURBT);  Surgeon: Cleon Gustin, MD;  Location: [facility];  Service: Urology;  Laterality: N/A;  . TRANSURETHRAL RESECTION OF PROSTATE N/A 02/28/2019   Procedure: TRANSURETHRAL RESECTION OF THE PROSTATE (TURP);  Surgeon: Cleon Gustin, MD;  Location[facility];  Service: Urology;  Laterality: N/A;  30 MINS  . UPPER GI ENDOSCOPY      Allergies  Allergen Reactions  . Adhesive [Tape] Other (See Comments)    Tears skin off PAPER TAPE IS OKAY    Outpatient Encounter Medications as of 03/27/2021  Medication Sig  . albuterol (VENTOLIN HFA) 108 (90 Base) MCG/ACT inhaler Inhale 2 puffs into the lungs every 6 (six) hours as needed.  . chlordiazePOXIDE (LIBRIUM) 10 MG capsule Take 1 capsule (10 mg total) by mouth daily.  . Cholecalciferol (VITAMIN D3) 1000 units CAPS Take 1,000 Units by mouth daily.  . solifenacin (VESICARE) 10 MG tablet Take 10 mg by mouth daily. In the morning.  . tamsulosin (FLOMAX) 0.4 MG CAPS capsule Take 0.4 mg by mouth daily.  . [DISCONTINUED] oxycodone (OXY-IR) 5 MG capsule Take 5 mg by mouth every 6 (six) hours as needed.  . [DISCONTINUED] pravastatin (PRAVACHOL) 40 MG tablet Take 1 tablet (40 mg total) by mouth daily. In the morning.  Marland Kitchen oxycodone (OXY-IR) 5 MG capsule  Take 1 capsule (5 mg total) by mouth every 6 (six) hours as needed.  . sulfamethoxazole-trimethoprim (BACTRIM) 400-80 MG tablet Take 1 tablet by mouth every 12 (twelve) hours.  . [DISCONTINUED] acetaminophen (TYLENOL) 325 MG tablet Take 325 mg by mouth every 6 (six) hours as needed (for pain).  . [DISCONTINUED] HYDROcodone-acetaminophen (NORCO/VICODIN) 5-325 MG tablet Take 1 tablet by mouth every 4 (four) hours as needed for moderate pain or severe pain.  . [DISCONTINUED] umeclidinium-vilanterol (ANORO ELLIPTA) 62.5-25 MCG/INH AEPB Inhale 1 puff into the lungs daily. (Patient taking differently: Inhale 1 puff into the lungs daily as needed (SOB).)   No facility-administered encounter medications on file as of 03/27/2021.    Review of Systems:  Review of Systems  Constitutional: Positive for activity change, appetite change and unexpected weight change.  HENT: Negative.   Respiratory: Positive for cough and shortness of breath.   Cardiovascular: Negative.   Gastrointestinal: Positive for abdominal pain.  Genitourinary: Positive for flank pain, frequency and  urgency.  Musculoskeletal: Positive for arthralgias.  Skin: Negative.   Neurological: Positive for weakness.  Psychiatric/Behavioral: Negative.     Health Maintenance  Topic Date Due  . TETANUS/TDAP  Never done  . Zoster Vaccines- Shingrix (1 of 2) Never done  . COVID-19 Vaccine (4 - Booster for Moderna series) 12/18/2020  . INFLUENZA VACCINE  06/03/2021  . PNA vac Low Risk Adult  Completed  . HPV VACCINES  Aged Out    Physical Exam: Vitals:   03/27/21 1441  BP: 104/72  Pulse: (!) 113  Temp: (!) 97.4 F (36.3 C)  SpO2: 99%  Weight: 109 lb 14.4 oz (49.9 kg)  Height: 5' 5.5" (1.664 m)   Body mass index is 18.01 kg/m. Physical Exam Constitutional: Oriented to person, place, and time. Very Frail HENT:  Head: Normocephalic.  Mouth/Throat: Oropharynx is clear and moist.  Eyes: Pupils are equal, round, and reactive to  light.  Neck: Neck supple.  Cardiovascular: Normal rate and normal heart sounds.  No murmur heard. Pulmonary/Chest: Effort normal and breath sounds normal. No respiratory distress. No wheezes. no rales.  Abdominal: Soft. Bowel sounds are normal. No distension. There is no tenderness. There is no rebound.  Musculoskeletal: No edema.  Lymphadenopathy: none Neurological: Alert and oriented to person, place, and time.  Skin: Skin is warm and dry.  Psychiatric: Normal mood and affect. Behavior is normal. Thought content normal.  Labs reviewed: Basic Metabolic Panel: Recent Labs    06/19/20 1349 11/18/20 1429 03/21/21 1201  NA 140 141 139  K 4.3 4.2 4.1  CL 105 104 102  CO2 28 30 23   GLUCOSE 120* 108* 93  BUN 26* 24* 19  CREATININE 1.45* 1.74* 1.51*  CALCIUM 8.4* 8.8* 9.5  TSH  --   --  1.57   Liver Function Tests: Recent Labs    05/03/20 0337 05/31/20 1354 11/18/20 1429 03/21/21 1201  AST 26 15 23 12   ALT 42 11 24 8*  ALKPHOS 69  --  71  --   BILITOT 0.7 0.5 0.7 0.5  PROT 5.1* 6.3 6.9 6.6  ALBUMIN 2.3*  --  4.3  --    No results for input(s): LIPASE, AMYLASE in the last 8760 hours. No results for input(s): AMMONIA in the last 8760 hours. CBC: Recent Labs    05/31/20 1354 11/18/20 1429 03/21/21 1201  WBC 7.5 6.6 8.3  NEUTROABS 4,035 4.3 4,822  HGB 12.6* 13.6 14.4  HCT 38.6 41.0 43.3  MCV 88.1 93.8 91.5  PLT 258 193 222   Lipid Panel: Recent Labs    05/31/20 1354 03/21/21 1201  CHOL 167 163  HDL 61 65  LDLCALC 85 79  TRIG 117 100  CHOLHDL 2.7 2.5   No results found for: HGBA1C  Procedures since last visit: No results found.  Assessment/Plan 1. Urothelial cancer ([place]) Do not have Notes from urology Per patient no more treatment Will try to get notes from [facility] -on Vesicare, Bactrim and FLomax  Amb Referral to Palliative Care  2. Physical deconditioning Talked to [place] social worker to try to move him and his wife to [place]  - Amb Referral to  Palliative Care  3. Weight loss Related to his Cancer Trying with supplements  4. Bladder pain Renewed Prescriptin for Oxycodone Contract signed 5. Pulmonary emphysema, unspecified emphysema type ([place]) On Inhaler  6. Hyperlipidemia, unspecified hyperlipidemia type Discontinue his Statin due to weight loss and Poor Prognosis 7 Anxiety On Librium 8 CKD stage 3 Creat stable  No Follow up as they are trying to move to [place]    Labs/tests ordered:  * No order type specified * Next appt:  Visit date not found Total time spent in this patient care encounter was  60_  minutes; greater than 50% of the visit spent counseling patient and staff, reviewing records , Labs and coordinating care for problems addressed at this encounter.

## 2021-03-28 NOTE — Assessment & Plan Note (Signed)
The nature of wet macular degeneration was discussed with the patient.  Forms of therapy reviewed include the use of Anti-VEGF medications injected painlessly into the eye, as well as other possible treatment modalities, including thermal laser therapy. Fellow eye involvement and risks were discussed with the patient. Upon the finding of wet age related macular degeneration, treatment will be offered. The treatment regimen is on a treat as needed basis with the intent to treat if necessary and extend interval of exams when possible. On average 1 out of 6 patients do not need lifetime therapy. However, the risk of recurrent disease is high for a lifetime.  Initially monthly, then periodic, examinations and evaluations will determine whether the next treatment is required on the day of the examination.  Post injection Avastin No. 2, stable serous retinal detachment subfoveal location yet not improving.  We will repeat Avastin today and be prepared to switch to intravitreal Eylea OS next

## 2021-03-28 NOTE — Progress Notes (Signed)
03/28/2021     CHIEF COMPLAINT Patient presents for Retina Follow Up (5 Wk F/U OS, poss Avastin OS//Pt sts VA OS is stable, and may be slightly worse, and he is having trouble reading the dial on the TV.)   HISTORY OF PRESENT ILLNESS: Jesse [name] is a 87 y.o. male who presents to the clinic today for:   HPI    Retina Follow Up    Diagnosis: Wet AMD   Laterality: left eye   Onset: 5 weeks ago   Severity: mild   Duration: 5 weeks   Course: stable   Comments: 5 Wk F/U OS, poss Avastin OS  Pt sts VA OS is stable, and may be slightly worse, and he is having trouble reading the dial on the TV.       Last edited by Rockie Neighbours, Bowman on 03/28/2021  2:34 PM. (History)      Referring physician: Virgie Dad, MD [place],  [place] [id number]  HISTORICAL INFORMATION:   Selected notes from the MEDICAL RECORD NUMBER       CURRENT MEDICATIONS: No current outpatient medications on file. (Ophthalmic Drugs)   No current facility-administered medications for this visit. (Ophthalmic Drugs)   Current Outpatient Medications (Other)  Medication Sig  . albuterol (VENTOLIN HFA) 108 (90 Base) MCG/ACT inhaler Inhale 2 puffs into the lungs every 6 (six) hours as needed.  . chlordiazePOXIDE (LIBRIUM) 10 MG capsule Take 1 capsule (10 mg total) by mouth daily.  . Cholecalciferol (VITAMIN D3) 1000 units CAPS Take 1,000 Units by mouth daily.  Marland Kitchen oxycodone (OXY-IR) 5 MG capsule Take 1 capsule (5 mg total) by mouth every 6 (six) hours as needed.  . solifenacin (VESICARE) 10 MG tablet Take 10 mg by mouth daily. In the morning.  . sulfamethoxazole-trimethoprim (BACTRIM) 400-80 MG tablet Take 1 tablet by mouth every 12 (twelve) hours.  . tamsulosin (FLOMAX) 0.4 MG CAPS capsule Take 0.4 mg by mouth daily.   No current facility-administered medications for this visit. (Other)      REVIEW OF SYSTEMS:    ALLERGIES Allergies  Allergen Reactions  . Adhesive [Tape]  Other (See Comments)    Tears skin off PAPER TAPE IS OKAY    PAST MEDICAL HISTORY Past Medical History:  Diagnosis Date  . Anxiety   . Asthma    as child  . Bladder cancer [facility]) urologist-  dr Elliot Gault [facility] in [place], [place])   dx 01/ 2016--- post TURBT and post Bladder bx 02-10-2017  . Bladder tumor   . BPH (benign prostatic hyperplasia)   . Frequency of urination   . History of asthma    childhood  . History of external beam radiation therapy    2002  prostate/ pelvis and boost w/ radioactive prostate seed implants   . History of hypercalcemia    s/p  parathyroidectomy 2003  . History of prostate cancer current urologist-  dr Elliot Gault [facility] in [place], [place])-- per lov note (care everywhere) last PSA undetectable   dx 2002--  urologist-- dr dahlstedt/ oncologist dr Valere Dross---  Gleason 7 out of 10, PSA 4.9---post Radioactive Prostate Seed Implant and External Beam Radiation therapy  . History of skin cancer   . Hyperlipidemia   . Multiple lung nodules    since 2006--- followed by pcp --- dr Maudie Mercury  . Pneumonia   . Renal cyst, left   . Wears glasses   . Wears hearing aid in both  ears    Past Surgical History:  Procedure Laterality Date  . CARDIOVASCULAR STRESS TEST  01/01/2004   normal nuclear perfustion study w/ no ischemia/  normal LV function and wall motion, ef 65%  . CATARACT EXTRACTION W/ INTRAOCULAR LENS  IMPLANT, BILATERAL  2010  . COLONOSCOPY    . CYSTO/  BILATERAL RETROGRADE PYELOGRAM/  BLADDER BX'S AND FULGERATION  02-10-2017   dr Anabel Bene [facility] in [place], [place]  . CYSTOSCOPY W/ RETROGRADES Bilateral 07/27/2017   Procedure: CYSTOSCOPY,SELECTIVE CYTOLOGIES,RETROGRADE PYELOGRAM;  Surgeon: Cleon Gustin, MD;  Location: [facility];  Service: Urology;  Laterality: Bilateral;  . CYSTOSCOPY W/ RETROGRADES Bilateral 12/08/2017   Procedure: CYSTOSCOPY WITH RETROGRADE PYELOGRAM;  Surgeon: Cleon Gustin, MD;  Location:  [facility];  Service: Urology;  Laterality: Bilateral;  . CYSTOSCOPY WITH BIOPSY N/A 07/27/2017   Procedure: CYSTOSCOPY WITH BIOPSY OF BLADDER AND PROSTATIC URETHRA;  Surgeon: Cleon Gustin, MD;  Location: [facility];  Service: Urology;  Laterality: N/A;  . CYSTOSCOPY WITH BIOPSY Bilateral 12/08/2017   Procedure: CYSTOSCOPY WITH RENAL WASHINGS;  Surgeon: Cleon Gustin, MD;  Location: [facility];  Service: Urology;  Laterality: Bilateral;  . CYSTOSCOPY WITH RETROGRADE PYELOGRAM, URETEROSCOPY AND STENT PLACEMENT Left 11/26/2020   Procedure: CYSTOSCOPY WITH LEFT RETROGRADE PYELOGRAM, URETEROSCOPY, BRUSH BIOPSY, TURBT;  Surgeon: Franchot Gallo, MD;  Location: [facility];  Service: Urology;  Laterality: Left;  . FIBEROPTIC BRONCHOSCOPY  08-20-2005   dr wert   w/ Left lower lobe bx  . PARATHYROIDECTOMY  2003  . RADIOACTIVE PROSTATE SEED IMPLANTS  04-05-2001    dr Diona Fanti [facility]  . TONSILLECTOMY  child  . TRANSURETHRAL RESECTION OF BLADDER TUMOR  11-24-2014   dr Anabel Bene [facility] in [place], [place]  . TRANSURETHRAL RESECTION OF BLADDER TUMOR N/A 04/08/2018   Procedure: TRANSURETHRAL RESECTION OF BLADDER TUMOR (TURBT);  Surgeon: Cleon Gustin, MD;  Location: [facility];  Service: Urology;  Laterality: N/A;  . TRANSURETHRAL RESECTION OF PROSTATE N/A 02/28/2019   Procedure: TRANSURETHRAL RESECTION OF THE PROSTATE (TURP);  Surgeon: Cleon Gustin, MD;  Location: [facility];  Service: Urology;  Laterality: N/A;  30 MINS  . UPPER GI ENDOSCOPY      FAMILY HISTORY History reviewed. No pertinent family history.  SOCIAL HISTORY Social History   Tobacco Use  . Smoking status: Former Smoker    Years: 50.00    Types: Cigarettes    Quit date: 11/03/1984    Years since quitting: 36.4  . Smokeless tobacco: Never Used  Vaping Use  . Vaping Use: Never used  Substance Use Topics  . Alcohol use: Yes     Alcohol/week: 7.0 standard drinks    Types: 7 Glasses of wine per week    Comment: daily wine  . Drug use: No         OPHTHALMIC EXAM: Base Eye Exam    Visual Acuity (ETDRS)      Right Left   Dist cc CF at 3' 20/40   Dist ph cc NI NI   Correction: Glasses       Tonometry (Tonopen, 2:39 PM)      Right Left   Pressure 15 11       Pupils      Pupils Dark Light Shape React APD   Right PERRL 3 3 Round Minimal None   Left PERRL 3 3 Round Minimal None       Visual Fields (  Counting fingers)      Left Right    Full Full       Extraocular Movement      Right Left    Full Full       Neuro/Psych    Oriented x3: Yes   Mood/Affect: Normal       Dilation    Left eye: 1.0% Mydriacyl, 2.5% Phenylephrine @ 2:39 PM        Slit Lamp and Fundus Exam    Slit Lamp Exam      Right Left   Lids/Lashes Normal Normal   Conjunctiva/Sclera White and quiet White and quiet   Cornea Clear Clear   Anterior Chamber Deep and quiet Deep and quiet   Iris Round and reactive Round and reactive   Lens Posterior chamber intraocular lens Posterior chamber intraocular lens   Anterior Vitreous Normal Normal       Fundus Exam      Right Left   Posterior Vitreous  Posterior vitreous detachment   Disc  Normal   C/D Ratio  0.3   Macula  Hard drusen, no exudates, no hemorrhage, new macular thickening, Retinal pigment epithelial mottling   Vessels  Normal   Periphery  Normal          IMAGING AND PROCEDURES  Imaging and Procedures for 03/28/21  OCT, Retina - OU - Both Eyes       Right Eye Quality was good. Scan locations included subfoveal. Central Foveal Thickness: 470. Progression has been stable. Findings include epiretinal membrane, cystoid macular edema, subretinal scarring, disciform scar.   Left Eye Quality was good. Scan locations included subfoveal. Central Foveal Thickness: 281. Progression has improved. Findings include subretinal fluid, pigment epithelial detachment.    Notes OD with central CME, persistent with severe epiretinal membrane.  Nonetheless outer retinal scarring precludes any attempted surgery at this point in the right eye for epiretinal membrane removal.  Vastly improved improved macular anatomy with smaller PED and smaller amount of subretinal fluid In the foveal location status post injection #2 Avastin.  We will repeat injection today, and will prepare for injection OS next if necessary with Eylea       Intravitreal Injection, Pharmacologic Agent - OS - Left Eye       Time Out 03/28/2021. 3:14 PM. Confirmed correct patient, procedure, site, and patient consented.   Anesthesia Topical anesthesia was used. Anesthetic medications included Akten 3.5%.   Procedure Preparation included Tobramycin 0.3%, Ofloxacin , 10% betadine to eyelids, 5% betadine to ocular surface. A 30 gauge needle was used.   Injection:  2.5 mg Bevacizumab (AVASTIN) 2.5mg /0.4mL SOSY   NDC: 19417-408-14, Lot: [id number]   Route: Intravitreal, Site: Left Eye  Post-op Post injection exam found visual acuity of at least counting fingers. The patient tolerated the procedure well. There were no complications. The patient received written and verbal post procedure care education. Post injection medications were not given.                 ASSESSMENT/PLAN:  Exudative age-related macular degeneration of left eye with active choroidal neovascularization (HCC) The nature of wet macular degeneration was discussed with the patient.  Forms of therapy reviewed include the use of Anti-VEGF medications injected painlessly into the eye, as well as other possible treatment modalities, including thermal laser therapy. Fellow eye involvement and risks were discussed with the patient. Upon the finding of wet age related macular degeneration, treatment will be offered. The treatment regimen is on a treat  as needed basis with the intent to treat if necessary and extend interval of  exams when possible. On average 1 out of 6 patients do not need lifetime therapy. However, the risk of recurrent disease is high for a lifetime.  Initially monthly, then periodic, examinations and evaluations will determine whether the next treatment is required on the day of the examination.  Post injection Avastin No. 2, stable serous retinal detachment subfoveal location yet not improving.  We will repeat Avastin today and be prepared to switch to intravitreal Eylea OS next  Exudative age-related macular degeneration of right eye with inactive choroidal neovascularization (HCC) No OCT signs of progression  Right epiretinal membrane Epiretinal membrane with significant thickening OD yet there is outer retinal loss of RPE and chronic subfoveal scarring limiting acuity overall.      ICD-10-CM   1. Exudative age-related macular degeneration of left eye with active choroidal neovascularization (HCC)  H35.3221 OCT, Retina - OU - Both Eyes    Intravitreal Injection, Pharmacologic Agent - OS - Left Eye    bevacizumab (AVASTIN) SOSY 2.5 mg  2. Exudative age-related macular degeneration of right eye with inactive choroidal neovascularization ([place])  H35.3212   3. Right epiretinal membrane  H35.371     1.  Active new CNVM OS, repeat intravitreal Avastin today, follow-up in 5 weeks and probable switch to intravitreal Eylea if not improving at that time  2.  3.  Ophthalmic Meds Ordered this visit:  Meds ordered this encounter  Medications  . bevacizumab (AVASTIN) SOSY 2.5 mg       Return in about 5 weeks (around 05/02/2021) for dilate, OS, EYLEA OCT.  There are no Patient Instructions on file for this visit.   Explained the diagnoses, plan, and follow up with the patient and they expressed understanding.  Patient expressed understanding of the importance of proper follow up care.   Clent Demark Mandee Pluta M.D. Diseases & Surgery of the Retina and Vitreous [facility] [place] 03/28/21     Abbreviations: M myopia (nearsighted); A astigmatism; H hyperopia (farsighted); P presbyopia; Mrx spectacle prescription;  CTL contact lenses; OD right eye; OS left eye; OU both eyes  XT exotropia; ET esotropia; PEK punctate epithelial keratitis; PEE punctate epithelial erosions; DES dry eye syndrome; MGD meibomian gland dysfunction; ATs artificial tears; PFAT's preservative free artificial tears; [place] nuclear sclerotic cataract; PSC posterior subcapsular cataract; ERM epi-retinal membrane; PVD posterior vitreous detachment; RD retinal detachment; DM diabetes mellitus; DR diabetic retinopathy; NPDR non-proliferative diabetic retinopathy; PDR proliferative diabetic retinopathy; CSME clinically significant macular edema; DME diabetic macular edema; dbh dot blot hemorrhages; CWS cotton wool spot; POAG primary open angle glaucoma; C/D cup-to-disc ratio; HVF humphrey visual field; GVF goldmann visual field; OCT optical coherence tomography; IOP intraocular pressure; BRVO Branch retinal vein occlusion; CRVO central retinal vein occlusion; CRAO central retinal artery occlusion; BRAO branch retinal artery occlusion; RT retinal tear; SB scleral buckle; PPV pars plana vitrectomy; VH Vitreous hemorrhage; PRP panretinal laser photocoagulation; IVK intravitreal kenalog; VMT vitreomacular traction; MH Macular hole;  NVD neovascularization of the disc; NVE neovascularization elsewhere; AREDS age related eye disease study; ARMD age related macular degeneration; POAG primary open angle glaucoma; EBMD epithelial/anterior basement membrane dystrophy; ACIOL anterior chamber intraocular lens; IOL intraocular lens; PCIOL posterior chamber intraocular lens; Phaco/IOL phacoemulsification with intraocular lens placement; [place] photorefractive keratectomy; LASIK laser assisted in situ keratomileusis; HTN hypertension; DM diabetes mellitus; COPD chronic obstructive pulmonary disease

## 2021-03-28 NOTE — Assessment & Plan Note (Signed)
No OCT signs of progression

## 2021-03-28 NOTE — Assessment & Plan Note (Signed)
Epiretinal membrane with significant thickening OD yet there is outer retinal loss of RPE and chronic subfoveal scarring limiting acuity overall.

## 2021-03-28 NOTE — Telephone Encounter (Signed)
Per Dr. Lyndel Safe, "Get last note from [facility] Dr. Diona Fanti, MD"  [facility] office, spoke with [place]. Last note on the way by fax.

## 2021-03-29 NOTE — Telephone Encounter (Signed)
Received

## 2021-04-02 ENCOUNTER — Other Ambulatory Visit: Payer: Self-pay

## 2021-04-02 ENCOUNTER — Other Ambulatory Visit: Payer: Medicare Other | Admitting: Student

## 2021-04-02 DIAGNOSIS — Z515 Encounter for palliative care: Secondary | ICD-10-CM

## 2021-04-02 DIAGNOSIS — R630 Anorexia: Secondary | ICD-10-CM

## 2021-04-02 DIAGNOSIS — C678 Malignant neoplasm of overlapping sites of bladder: Secondary | ICD-10-CM

## 2021-04-02 DIAGNOSIS — R0600 Dyspnea, unspecified: Secondary | ICD-10-CM

## 2021-04-02 DIAGNOSIS — R0609 Other forms of dyspnea: Secondary | ICD-10-CM

## 2021-04-02 NOTE — Progress Notes (Signed)
[facility] Palliative Care Consult Note Telephone: [phone]  Fax: [phone]    Date of encounter: 04/02/21 PATIENT NAME: Jesse [name] [address] [place] [address]   [phone] (home)  DOB: 27-Apr-1932 MRN: [id number] PRIMARY CARE PROVIDER:    Virgie Dad, MD,  [place] [address] [phone]  REFERRING PROVIDER:   Virgie Dad, MD [address] [place],  [place] [address] [phone]  RESPONSIBLE PARTY:    Contact Information    Name Relation Home Work Mobile   [place] Son [phone]  [phone]   [place] [phone]     Cecille Aver [phone]  [phone]       I met face to face with patient and family in the home. Palliative Care was asked to follow this patient by consultation request of  Virgie Dad, MD to address advance care planning and complex medical decision making. This is the initial visit.                                     ASSESSMENT AND PLAN / RECOMMENDATIONS:   Advance Care Planning/Goals of Care: Goals include to maximize quality of life and symptom management. Our advance care planning conversation included a discussion about:     The value and importance of advance care planning   Experiences with loved ones who have been seriously ill or have died   Exploration of personal, cultural or spiritual beliefs that might influence medical decisions   Exploration of goals of care in the event of a sudden injury or illness   Identification and preparation of a healthcare agent   MOST form completed today.  CODE STATUS: DNR  Discussed Palliative vs. Hospice services. Palliative will continue to provide support. Will transition to Hospice as when he meets criteria and is agreeable.   Symptom Management/Plan:  Malignant neoplasm of overlapping sites of bladder-patient with obstructive urothelial cancer of  left ureter, high grade muscle invasive bladder cancer. Not a candidate for aggressive or adjuvant therapy. Pain-has been receiving oxycodone PRN. Recommend-Oxycodone ER 10mg  every 12 hours, may use oxycodone IR 5mg  every 6 hours PRN. Monitor for constipation given routine pain medication administration. Follow up with Urology as scheduled.  Appetite/weight loss-patient with fair appetite; endorses 35 pound weight loss in last 2.5 years. Declines appetite stimulant at this time. Encourage nutritional supplement, encourage foods he enjoys.   Shortness of breath-secondary to COPD, hx of asthma. Continue albuterol 2 puffs every 6 hours as needed. Education and demonstration today on using inhaler.     Follow up Palliative Care Visit: Palliative care will continue to follow for complex medical decision making, advance care planning, and clarification of goals. Return in 4 weeks or prn.  I spent 60 minutes providing this consultation. More than 50% of the time in this consultation was spent in counseling and care coordination.   PPS: 70%  HOSPICE ELIGIBILITY/DIAGNOSIS: TBD  Chief Complaint: Palliative Medicine initial consult; malignant neoplasm of overlapping sites of bladder.   HISTORY OF PRESENT ILLNESS:  Jesse [name] is a 87 y.o. year old male  with obstructive urothelial cancer of left ureter, high grade muscle invasive bladder cancer, hx of prostate cancer, COPD, anxiety, depression, hyperlipidemia, exudative age related macular degeneration, CKD 3.  Cystoscopy TURBT < 2CM 2019, cystoscopy TURBT > 5cm 11/26/2020. Patient is  not a candidate for aggressive therapy, adjuvant therapy.   Patient currently resides at [facility] apartment. He will be moving to [place] next week with his wife. He endorses pain a 5/10 presently; pain goes up to a 10 when his bladder is full, needing to void. He is taking oxycodone IR $RemoveBefo'5mg'aMxntcaGlbl$  every 6 hours around the clock. He reports intermittent hematuria;  has occasional urinary incontinence. No recent urinary tract infections. He is up every 1.5-2 hours during the night to void. He endorses a fair appetite; eating 50% of meals. Reports eating one good meal a day, usually dinner, has a small breakfast, enjoys eating ice cream. Drinking one boost a day. Reports approximate 35 pound loss in the past 2.5 years, since having Covid infection.    History obtained from review of EMR, discussion with primary team, and interview with family, facility staff/caregiver and/or Jesse [name].  I reviewed available labs, medications, imaging, studies and related documents from the EMR.  Records reviewed and summarized above.   ROS  General: NAD EYES: receiving eye injection ENMT: denies dysphagia Cardiovascular: denies chest pain,  Pulmonary: denies cough, SOB with exertion Abdomen: occasional constipation GU: occasional hematuria MSK: denies weakness,  no falls reported Skin: denies rashes or wounds Neurological: fair sleep due to voiding Psych: Endorses positive mood Heme/lymph/immuno: denies bruises, abnormal bleeding  Physical Exam: Current weight: 109 pounds Pulse 88, resp 20, sats 96% on room air Constitutional: NAD General: frail appearing, thin EYES: anicteric sclera, lids intact, no discharge  ENMT: intact hearing, oral mucous membranes moist CV: S1S2, RRR, no LE edema Pulmonary: LCTA, no increased work of breathing, no cough, room air Abdomen: normo-active BS + 4 quadrants, soft and non tender GU: deferred MSK: moves all extremities, ambulatory Skin: warm and dry, no rashes or wounds on visible skin Neuro:  no generalized weakness, A & O x 3 Psych: non-anxious affect, pleasant Hem/lymph/immuno: no widespread bruising   CURRENT PROBLEM LIST:  Patient Active Problem List   Diagnosis Date Noted  . Exudative age-related macular degeneration of left eye with active choroidal neovascularization ([place]) 01/10/2021  . Malignant neoplasm  of overlapping sites of bladder ([place]) 11/26/2020  . Exudative age-related macular degeneration of right eye with inactive choroidal neovascularization ([place]) 07/12/2020  . Right epiretinal membrane 07/12/2020  . Advanced nonexudative age-related macular degeneration of right eye with subfoveal involvement 07/12/2020  . Intermediate stage nonexudative age-related macular degeneration of left eye 07/12/2020  . COPD (chronic obstructive pulmonary disease) with emphysema ([place]) 06/05/2020  . Hyperlipidemia 06/05/2020  . Pneumonia 05/02/2020  . Pleural effusion 05/02/2020  . Pericardial effusion 05/02/2020  . Kidney disease, chronic, stage [name] (GFR 30-59 ml/min) ([place]) 05/02/2020  . Chest pain 04/10/2020  . Hypotension 11/28/2019  . Anxiety 11/28/2019  . Poor appetite 11/28/2019  . Acute respiratory failure due to COVID-19 ([place]) 11/20/2019  . BPH (benign prostatic hyperplasia) 11/20/2019  . History of prostate cancer 11/20/2019  . Acute respiratory failure ([place]) 11/20/2019   PAST MEDICAL HISTORY:  Active Ambulatory Problems    Diagnosis Date Noted  . Acute respiratory failure due to COVID-19 ([place]) 11/20/2019  . BPH (benign prostatic hyperplasia) 11/20/2019  . History of prostate cancer 11/20/2019  . Acute respiratory failure ([place]) 11/20/2019  . Hypotension 11/28/2019  . Anxiety 11/28/2019  . Poor appetite 11/28/2019  . Chest pain 04/10/2020  . Pneumonia 05/02/2020  . Pleural effusion 05/02/2020  . Pericardial effusion 05/02/2020  . Kidney disease, chronic, stage [name] (GFR 30-59 ml/min) ([place]) 05/02/2020  .  COPD (chronic obstructive pulmonary disease) with emphysema ([place]) 06/05/2020  . Hyperlipidemia 06/05/2020  . Exudative age-related macular degeneration of right eye with inactive choroidal neovascularization ([place]) 07/12/2020  . Right epiretinal membrane 07/12/2020  . Advanced nonexudative age-related macular degeneration of right eye with subfoveal involvement 07/12/2020  .  Intermediate stage nonexudative age-related macular degeneration of left eye 07/12/2020  . Malignant neoplasm of overlapping sites of bladder ([place]) 11/26/2020  . Exudative age-related macular degeneration of left eye with active choroidal neovascularization ([place]) 01/10/2021   Resolved Ambulatory Problems    Diagnosis Date Noted  . No Resolved Ambulatory Problems   Past Medical History:  Diagnosis Date  . Asthma   . Bladder cancer [facility]) urologist-  dr Elliot Gault [facility] in [place], [place])  . Bladder tumor   . Frequency of urination   . History of asthma   . History of external beam radiation therapy   . History of hypercalcemia   . History of skin cancer   . Multiple lung nodules   . Renal cyst, left   . Wears glasses   . Wears hearing aid in both ears    SOCIAL HX:  Social History   Tobacco Use  . Smoking status: Former Smoker    Years: 50.00    Types: Cigarettes    Quit date: 11/03/1984    Years since quitting: 36.4  . Smokeless tobacco: Never Used  Substance Use Topics  . Alcohol use: Yes    Alcohol/week: 7.0 standard drinks    Types: 7 Glasses of wine per week    Comment: daily wine   FAMILY HX: No family history on file.    ALLERGIES:  Allergies  Allergen Reactions  . Adhesive [Tape] Other (See Comments)    Tears skin off PAPER TAPE IS OKAY     PERTINENT MEDICATIONS:  Outpatient Encounter Medications as of 04/02/2021  Medication Sig  . albuterol (VENTOLIN HFA) 108 (90 Base) MCG/ACT inhaler Inhale 2 puffs into the lungs every 6 (six) hours as needed.  . chlordiazePOXIDE (LIBRIUM) 10 MG capsule Take 1 capsule (10 mg total) by mouth daily.  . Cholecalciferol (VITAMIN D3) 1000 units CAPS Take 1,000 Units by mouth daily.  Marland Kitchen oxycodone (OXY-IR) 5 MG capsule Take 1 capsule (5 mg total) by mouth every 6 (six) hours as needed.  . solifenacin (VESICARE) 10 MG tablet Take 10 mg by mouth daily. In the morning.  . sulfamethoxazole-trimethoprim (BACTRIM) 400-80  MG tablet Take 1 tablet by mouth every 12 (twelve) hours.  . tamsulosin (FLOMAX) 0.4 MG CAPS capsule Take 0.4 mg by mouth daily.   No facility-administered encounter medications on file as of 04/02/2021.    Thank you for the opportunity to participate in the care of Jesse [name].  The palliative care team will continue to follow. Please call our office at [phone] if we can be of additional assistance.   Ezekiel Slocumb, NP   COVID-19 PATIENT SCREENING TOOL Asked and negative response unless otherwise noted:   Have you had symptoms of covid, tested positive or been in contact with someone with symptoms/positive test in the past 5-10 days? No

## 2021-04-09 ENCOUNTER — Telehealth: Payer: Self-pay | Admitting: Student

## 2021-04-09 NOTE — Telephone Encounter (Signed)
Palliative NP spoke with patient regarding PA for script sent last week. Patient was told item was not in stock and needed to be ordered. NP spoke with Audelia Hives at [facility]; generic or brand name Oxycontin not covered by his plan and would need authorization. Spoke with representative at [facility] and she states authorization would need to be completed by someone else, handled within state of [place]. Called [employer] as directed; they were closed for the day. Patient made aware and will try again tomorrow.

## 2021-04-17 ENCOUNTER — Telehealth: Payer: Self-pay | Admitting: Student

## 2021-04-17 NOTE — Telephone Encounter (Signed)
Late entry for 04/16/21 at 3pm. Palliative NP returned call to patient. He reports waking up during the night with pain. He has started oxycodone ER, continues PRN IR. Discussed adjusting time of oxycodone IR. He will run out of PRN oxycodone soon. Will send script for refill. F/u appointment scheduled for 04/23/21.

## 2021-04-23 ENCOUNTER — Telehealth: Payer: Self-pay | Admitting: Student

## 2021-04-23 NOTE — Telephone Encounter (Signed)
Palliative NP spoke with patient to confirm visit this afternoon. Patient states he has an appointment with Urology at 3pm due to hematuria that started over weekend. He does report pain being improved/managed. He is also in process of moving today, thus visit declined for today.

## 2021-04-24 ENCOUNTER — Other Ambulatory Visit: Payer: Self-pay | Admitting: *Deleted

## 2021-04-24 MED ORDER — CHLORDIAZEPOXIDE HCL 10 MG PO CAPS: 10.0000 mg | ORAL_CAPSULE | Freq: Every day | ORAL | 1 refills | Status: DC

## 2021-04-24 NOTE — Telephone Encounter (Signed)
Pharmacy [facility] medical Requested refill.  Pended Rx and sent to [facility] for approval.

## 2021-04-25 ENCOUNTER — Encounter: Payer: Self-pay | Admitting: Internal Medicine

## 2021-04-25 ENCOUNTER — Other Ambulatory Visit: Payer: Self-pay | Admitting: *Deleted

## 2021-04-25 ENCOUNTER — Non-Acute Institutional Stay: Payer: Medicare Other | Admitting: Internal Medicine

## 2021-04-25 DIAGNOSIS — C689 Malignant neoplasm of urinary organ, unspecified: Secondary | ICD-10-CM

## 2021-04-25 DIAGNOSIS — N1832 Chronic kidney disease, stage 3b: Secondary | ICD-10-CM

## 2021-04-25 DIAGNOSIS — F419 Anxiety disorder, unspecified: Secondary | ICD-10-CM

## 2021-04-25 DIAGNOSIS — R634 Abnormal weight loss: Secondary | ICD-10-CM

## 2021-04-25 DIAGNOSIS — R3989 Other symptoms and signs involving the genitourinary system: Secondary | ICD-10-CM

## 2021-04-25 DIAGNOSIS — J439 Emphysema, unspecified: Secondary | ICD-10-CM

## 2021-04-25 DIAGNOSIS — R5381 Other malaise: Secondary | ICD-10-CM | POA: Diagnosis not present

## 2021-04-25 MED ORDER — OXYCODONE HCL 5 MG PO CAPS: 5.0000 mg | ORAL_CAPSULE | Freq: Four times a day (QID) | ORAL | 0 refills | Status: DC | PRN

## 2021-04-25 NOTE — Telephone Encounter (Signed)
Received fax from [facility] Rx and sent to [facility] for approval.

## 2021-04-25 NOTE — Progress Notes (Signed)
Provider:  Veleta Miners MD Location:   [place] Room Number: 63 Place of Service:  ALF [phone])  PCP: Virgie Dad, MD Patient Care Team: Virgie Dad, MD as PCP - General (Internal Medicine) Ezekiel Slocumb, NP as Nurse Practitioner [facility] and Palliative Medicine)  Extended Emergency Contact Information Primary Emergency Contact: Mellissa Kohut of [place] Phone: [phone] Mobile Phone: [phone] Relation: Son Secondary Emergency Contact: Cranford,Ann Address: [address] [place]          [place], [place] [address] Johnnette Litter of [place] Phone: [phone] Relation: Spouse  Code Status: DNR Goals of Care: Advanced Directive information Advanced Directives 04/25/2021  Does Patient Have a Medical Advance Directive? Yes  Type of Advance Directive Out of facility DNR (pink MOST or yellow form)  Does patient want to make changes to medical advance directive? No - Patient declined  Copy of [place] in Chart? -  Would patient like information on creating a medical advance directive? -  Pre-existing out of facility DNR order (yellow form or pink MOST form) Pink MOST form placed in chart (order not valid for inpatient use);Yellow form placed in chart (order not valid for inpatient use)      Chief Complaint  Patient presents with   Acute Visit    HPI: Patient is a 87 y.o. male seen today for admission to [place]   Patient has PMH  Covid Pneumonia BPH and h/o Bladder cancer, HLD and Depression and COPD Also was admitted to Hospital from 6/30-7/2 for CAP and Pleural Effusion   Was having Hematuria CT scan  showed Multifocal Urothelial Cancer Had Cystoscopy on 01/24. Since then he has been Diagnosed with Obstructive Urothelial Cancer of Left Urethra with Muscle invasive cancer of Bladder This has caused him to have lot of Pain and hematuria He is now on OxyContin and Oxycodone. Recently seen  by Dr Gloriann Loan in [facility] who changed his Flomax to 0.8 mg and started him on bactrim for 7 days. Patient is also going tor another opinion from Dr Tresa Moore for surgery Right now he has moved to [place] with his wife as she has dementia and it is hard for him to take care of her His pain seems reasonably controlled. Continuous to have hematuria Wants to manage his Meds. Very Anxious about his move     Past Medical History:  Diagnosis Date   Anxiety    Asthma    as child   Bladder cancer [facility]) urologist-  dr Elliot Gault [facility] in [place], [place])   dx 01/ 2016--- post TURBT and post Bladder bx 02-10-2017   Bladder tumor    BPH (benign prostatic hyperplasia)    Frequency of urination    History of asthma    childhood   History of external beam radiation therapy    2002  prostate/ pelvis and boost w/ radioactive prostate seed implants    History of hypercalcemia    s/p  parathyroidectomy 2003   History of prostate cancer current urologist-  dr Elliot Gault [facility] in [place], [place])-- per lov note (care everywhere) last PSA undetectable   dx 2002--  urologist-- dr dahlstedt/ oncologist dr Valere Dross---  Gleason 7 out of 10, PSA 4.9---post Radioactive Prostate Seed Implant and External Beam Radiation therapy   History of skin cancer    Hyperlipidemia    Multiple lung nodules    since 2006--- followed by pcp --- dr Maudie Mercury  Pneumonia    Renal cyst, left    Wears glasses    Wears hearing aid in both ears    Past Surgical History:  Procedure Laterality Date   CARDIOVASCULAR STRESS TEST  01/01/2004   normal nuclear perfustion study w/ no ischemia/  normal LV function and wall motion, ef 65%   CATARACT EXTRACTION W/ INTRAOCULAR LENS  IMPLANT, BILATERAL  2010   COLONOSCOPY     CYSTO/  BILATERAL RETROGRADE PYELOGRAM/  BLADDER BX'S AND FULGERATION  02-10-2017   dr Anabel Bene [facility] in [place], [place] W/ RETROGRADES Bilateral 07/27/2017   Procedure:  CYSTOSCOPY,SELECTIVE CYTOLOGIES,RETROGRADE PYELOGRAM;  Surgeon: Cleon Gustin, MD;  Location: [facility];  Service: Urology;  Laterality: Bilateral;   CYSTOSCOPY W/ RETROGRADES Bilateral 12/08/2017   Procedure: CYSTOSCOPY WITH RETROGRADE PYELOGRAM;  Surgeon: Cleon Gustin, MD;  Location: [facility];  Service: Urology;  Laterality: Bilateral;   CYSTOSCOPY WITH BIOPSY N/A 07/27/2017   Procedure: CYSTOSCOPY WITH BIOPSY OF BLADDER AND PROSTATIC URETHRA;  Surgeon: Cleon Gustin, MD;  Location: [facility];  Service: Urology;  Laterality: N/A;   CYSTOSCOPY WITH BIOPSY Bilateral 12/08/2017   Procedure: CYSTOSCOPY WITH RENAL WASHINGS;  Surgeon: Cleon Gustin, MD;  Location[facility];  Service: Urology;  Laterality: Bilateral;   CYSTOSCOPY WITH RETROGRADE PYELOGRAM, URETEROSCOPY AND STENT PLACEMENT Left 11/26/2020   Procedure: CYSTOSCOPY WITH LEFT RETROGRADE PYELOGRAM, URETEROSCOPY, BRUSH BIOPSY, TURBT;  Surgeon: Franchot Gallo, MD;  Location: [facility];  Service: Urology;  Laterality: Left;   FIBEROPTIC BRONCHOSCOPY  08-20-2005   dr wert   w/ Left lower lobe bx   PARATHYROIDECTOMY  2003   RADIOACTIVE PROSTATE SEED IMPLANTS  04-05-2001    dr Diona Fanti [place]  child   TRANSURETHRAL RESECTION OF BLADDER TUMOR  11-24-2014   dr Anabel Bene [facility] in [place], [place] OF BLADDER TUMOR N/A 04/08/2018   Procedure: TRANSURETHRAL RESECTION OF BLADDER TUMOR (TURBT);  Surgeon: Cleon Gustin, MD;  Location: [facility];  Service: Urology;  Laterality: N/A;   TRANSURETHRAL RESECTION OF PROSTATE N/A 02/28/2019   Procedure: TRANSURETHRAL RESECTION OF THE PROSTATE (TURP);  Surgeon: Cleon Gustin, MD;  Location: [facility];  Service: Urology;  Laterality: N/A;  30 MINS   UPPER GI ENDOSCOPY      reports that he quit smoking about 36 years ago. His smoking  use included cigarettes. He has never used smokeless tobacco. He reports current alcohol use of about 7.0 standard drinks of alcohol per week. He reports that he does not use drugs. Social History   Socioeconomic History   Marital status: Married    Spouse name: Not on file   Number of children: Not on file   Years of education: Not on file   Highest education level: Not on file  Occupational History   Not on file  Tobacco Use   Smoking status: Former    Years: 50.00    Pack years: 0.00    Types: Cigarettes    Quit date: 11/03/1984    Years since quitting: 36.5   Smokeless tobacco: Never  Vaping Use   Vaping Use: Never used  Substance and Sexual Activity   Alcohol use: Yes    Alcohol/week: 7.0 standard drinks    Types: 7 Glasses of wine per week    Comment: daily wine   Drug use: No   Sexual activity: Not Currently  Other Topics Concern   Not on file  Social History Narrative   Not on file   Social Determinants of Health   Financial Resource Strain: Not on file  Food Insecurity: Not on file  Transportation Needs: Not on file  Physical Activity: Not on file  Stress: Not on file  Social Connections: Not on file  Intimate Partner Violence: Not on file    Functional Status Survey:    History reviewed. No pertinent family history.  Health Maintenance  Topic Date Due   TETANUS/TDAP  Never done   Zoster Vaccines- Shingrix (1 of 2) Never done   COVID-19 Vaccine (4 - Booster for Moderna series) 12/18/2020   INFLUENZA VACCINE  06/03/2021   PNA vac Low Risk Adult  Completed   HPV VACCINES  Aged Out    Allergies  Allergen Reactions   Adhesive [Tape] Other (See Comments)    Tears skin off PAPER TAPE IS OKAY    Allergies as of 04/25/2021       Reactions   Adhesive [tape] Other (See Comments)   Tears skin off PAPER TAPE IS OKAY        Medication List        Accurate as of April 25, 2021 11:59 PM. If you have any questions, ask your nurse or doctor.           STOP taking these medications    Vitamin D3 25 MCG (1000 UT) Caps Stopped by: Virgie Dad, MD       TAKE these medications    albuterol 108 (90 Base) MCG/ACT inhaler Commonly known as: VENTOLIN HFA Inhale 2 puffs into the lungs every 6 (six) hours as needed.   Anoro Ellipta 62.5-25 MCG/INH Aepb Generic drug: umeclidinium-vilanterol Inhale 1 puff into the lungs daily.   chlordiazePOXIDE 10 MG capsule Commonly known as: LIBRIUM Take 1 capsule (10 mg total) by mouth daily.   OxyCODONE HCl (Abuse Deter) 5 MG Taba Commonly known as: OXAYDO Take 5 mg by mouth as needed. What changed: Another medication with the same name was removed. Continue taking this medication, and follow the directions you see here. Changed by: May, Anita A, CMA   OxyCONTIN 10 mg 12 hr tablet Generic drug: oxyCODONE Take 10 mg by mouth in the morning and at bedtime. What changed: Another medication with the same name was removed. Continue taking this medication, and follow the directions you see here. Changed by: May, Anita A, CMA   solifenacin 10 MG tablet Commonly known as: VESICARE Take 10 mg by mouth daily. In the morning.   sulfamethoxazole-trimethoprim 400-80 MG tablet Commonly known as: BACTRIM Take 1 tablet by mouth every 12 (twelve) hours.   tamsulosin 0.4 MG Caps capsule Commonly known as: FLOMAX Take 0.4 mg by mouth daily.        Review of Systems  Constitutional:  Positive for activity change and appetite change.  HENT: Negative.    Respiratory:  Positive for shortness of breath.   Cardiovascular: Negative.   Gastrointestinal:  Positive for abdominal pain.  Genitourinary:  Positive for dysuria, hematuria and penile pain.  Musculoskeletal: Negative.   Skin: Negative.   Neurological: Negative.   Psychiatric/Behavioral:  Positive for dysphoric mood. The patient is nervous/anxious.    Vitals:   04/25/21 1339  BP: 116/69  Pulse: 92  Resp: 20  Temp: 98.4 F (36.9  C)  SpO2: 100%  Weight: 109 lb 14.4 oz (49.9 kg)  Height: 5' 5.5" (1.664 m)   Body  mass index is 18.01 kg/m. Physical Exam Vitals reviewed.  Constitutional:      Comments: Very frail  HENT:     Head: Normocephalic.     Nose: Nose normal.     Mouth/Throat:     Mouth: Mucous membranes are moist.     Pharynx: Oropharynx is clear.  Eyes:     Pupils: Pupils are equal, round, and reactive to light.  Cardiovascular:     Rate and Rhythm: Regular rhythm. Tachycardia present.     Pulses: Normal pulses.  Pulmonary:     Effort: Pulmonary effort is normal.     Breath sounds: Normal breath sounds.     Comments: Poor Air Movement Abdominal:     General: Abdomen is flat. Bowel sounds are normal.     Palpations: Abdomen is soft.     Comments: C/o Lower Abdominal Pain  Musculoskeletal:     Cervical back: Neck supple.  Skin:    General: Skin is warm.  Neurological:     General: No focal deficit present.     Mental Status: He is alert and oriented to person, place, and time.  Psychiatric:     Comments: Anxious about his move to [place]    Labs reviewed: Basic Metabolic Panel: Recent Labs    06/19/20 1349 11/18/20 1429 03/21/21 1201  NA 140 141 139  K 4.3 4.2 4.1  CL 105 104 102  CO2 28 30 23   GLUCOSE 120* 108* 93  BUN 26* 24* 19  CREATININE 1.45* 1.74* 1.51*  CALCIUM 8.4* 8.8* 9.5   Liver Function Tests: Recent Labs    05/03/20 0337 05/31/20 1354 11/18/20 1429 03/21/21 1201  AST 26 15 23 12   ALT 42 11 24 8*  ALKPHOS 69  --  71  --   BILITOT 0.7 0.5 0.7 0.5  PROT 5.1* 6.3 6.9 6.6  ALBUMIN 2.3*  --  4.3  --    No results for input(s): LIPASE, AMYLASE in the last 8760 hours. No results for input(s): AMMONIA in the last 8760 hours. CBC: Recent Labs    05/31/20 1354 11/18/20 1429 03/21/21 1201  WBC 7.5 6.6 8.3  NEUTROABS 4,035 4.3 4,822  HGB 12.6* 13.6 14.4  HCT 38.6 41.0 43.3  MCV 88.1 93.8 91.5  PLT 258 193 222   Cardiac Enzymes: No results for input(s):  CKTOTAL, CKMB, CKMBINDEX, TROPONINI in the last 8760 hours. BNP: Invalid input(s): POCBNP No results found for: HGBA1C Lab Results  Component Value Date   TSH 1.57 03/21/2021   Lab Results  Component Value Date   GGEZMOQH47 654 01/12/2020   No results found for: FOLATE Lab Results  Component Value Date   FERRITIN 316 11/25/2019    Imaging and Procedures obtained prior to SNF admission: No results found.  Assessment/Plan Urothelial cancer ([place]) Follows with [facility] Is going to see another Physician to see if he can be surgical candidate They increased his Flomax to 0.8mg  and Treating with Bactrim for 1 week Continues to have pain and Hematuria Palliative care involved  Physical deconditioning Is going to work with Therapy  Weight loss Due to his Cancer and COPD  Bladder pain Now on Oxycontin and Oxycodone He wants to keep meds with him  Pulmonary emphysema, unspecified emphysema type ([place]) Does not want Anoro ? Cost Uses Proair PRN  Stage 3b chronic kidney disease ([place]) Creat stable  Anxiety On librium   Family/ staff Communication:   Labs/tests ordered:  Total time spent in this patient  care encounter was  _60  minutes; greater than 50% of the visit spent counseling patient and staff, reviewing records , Labs and coordinating care for problems addressed at this encounter.

## 2021-05-02 ENCOUNTER — Ambulatory Visit (INDEPENDENT_AMBULATORY_CARE_PROVIDER_SITE_OTHER): Payer: Medicare Other | Admitting: Ophthalmology

## 2021-05-02 ENCOUNTER — Encounter (INDEPENDENT_AMBULATORY_CARE_PROVIDER_SITE_OTHER): Payer: Self-pay | Admitting: Ophthalmology

## 2021-05-02 ENCOUNTER — Other Ambulatory Visit: Payer: Self-pay

## 2021-05-02 DIAGNOSIS — H35371 Puckering of macula, right eye: Secondary | ICD-10-CM

## 2021-05-02 DIAGNOSIS — H353221 Exudative age-related macular degeneration, left eye, with active choroidal neovascularization: Secondary | ICD-10-CM | POA: Diagnosis not present

## 2021-05-02 MED ORDER — AFLIBERCEPT 2MG/0.05ML IZ SOLN FOR KALEIDOSCOPE
2.0000 mg | INTRAVITREAL | Status: AC | PRN
  Administered 2021-05-02: 2 mg via INTRAVITREAL

## 2021-05-02 NOTE — Progress Notes (Signed)
05/02/2021     CHIEF COMPLAINT Patient presents for Retina Follow Up (5 Wk F/U OS, poss Eylea OS//Pt denies noticeable changes to [place] OU since last visit. Pt denies ocular pain, flashes of light, or floaters OU. //)   HISTORY OF PRESENT ILLNESS: Jesse [name] is a 87 y.o. male who presents to the clinic today for:   HPI     Retina Follow Up           Diagnosis: Wet AMD   Laterality: left eye   Onset: 5 weeks ago   Severity: mild   Duration: 5 weeks   Course: stable   Comments: 5 Wk F/U OS, poss Eylea OS  Pt denies noticeable changes to [place] OU since last visit. Pt denies ocular pain, flashes of light, or floaters OU.          Last edited by Milly Jakob, [place] on 05/02/2021  2:47 PM.      Referring physician: Virgie Dad, MD [place],  [place] [phone]  HISTORICAL INFORMATION:   Selected notes from the MEDICAL RECORD NUMBER       CURRENT MEDICATIONS: No current outpatient medications on file. (Ophthalmic Drugs)   No current facility-administered medications for this visit. (Ophthalmic Drugs)   Current Outpatient Medications (Other)  Medication Sig   albuterol (VENTOLIN HFA) 108 (90 Base) MCG/ACT inhaler Inhale 2 puffs into the lungs every 6 (six) hours as needed.   chlordiazePOXIDE (LIBRIUM) 10 MG capsule Take 1 capsule (10 mg total) by mouth daily.   oxyCODONE (OXYCONTIN) 10 mg 12 hr tablet Take 10 mg by mouth in the morning and at bedtime.   OxyCODONE HCl, Abuse Deter, (OXAYDO) 5 MG TABA Take 5 mg by mouth every 4 (four) hours as needed.   solifenacin (VESICARE) 10 MG tablet Take 10 mg by mouth daily. In the morning.   sulfamethoxazole-trimethoprim (BACTRIM DS) 800-160 MG tablet Take 1 tablet by mouth 2 (two) times daily.   tamsulosin (FLOMAX) 0.4 MG CAPS capsule Take 0.8 mg by mouth daily.   No current facility-administered medications for this visit. (Other)      REVIEW OF SYSTEMS:    ALLERGIES Allergies  Allergen  Reactions   Adhesive [Tape] Other (See Comments)    Tears skin off PAPER TAPE IS OKAY    PAST MEDICAL HISTORY Past Medical History:  Diagnosis Date   Anxiety    Asthma    as child   Bladder cancer [facility]) urologist-  dr Elliot Gault [facility] in [place], [place])   dx 01/ 2016--- post TURBT and post Bladder bx 02-10-2017   Bladder tumor    BPH (benign prostatic hyperplasia)    Frequency of urination    History of asthma    childhood   History of external beam radiation therapy    2002  prostate/ pelvis and boost w/ radioactive prostate seed implants    History of hypercalcemia    s/p  parathyroidectomy 2003   History of prostate cancer current urologist-  dr Elliot Gault [facility] in [place], [place])-- per lov note (care everywhere) last PSA undetectable   dx 2002--  urologist-- dr dahlstedt/ oncologist dr Valere Dross---  Gleason 7 out of 10, PSA 4.9---post Radioactive Prostate Seed Implant and External Beam Radiation therapy   History of skin cancer    Hyperlipidemia    Multiple lung nodules    since 2006--- followed by pcp --- dr kim   Pneumonia    Renal cyst,  left    Wears glasses    Wears hearing aid in both ears    Past Surgical History:  Procedure Laterality Date   CARDIOVASCULAR STRESS TEST  01/01/2004   normal nuclear perfustion study w/ no ischemia/  normal LV function and wall motion, ef 65%   CATARACT EXTRACTION W/ INTRAOCULAR LENS  IMPLANT, BILATERAL  2010   COLONOSCOPY     CYSTO/  BILATERAL RETROGRADE PYELOGRAM/  BLADDER BX'S AND FULGERATION  02-10-2017   dr Anabel Bene [facility] in [place], [place] W/ RETROGRADES Bilateral 07/27/2017   Procedure: CYSTOSCOPY,SELECTIVE CYTOLOGIES,RETROGRADE PYELOGRAM;  Surgeon: Cleon Gustin, MD;  [facility];  Service: Urology;  Laterality: Bilateral;   CYSTOSCOPY W/ RETROGRADES Bilateral 12/08/2017   Procedure: CYSTOSCOPY WITH RETROGRADE PYELOGRAM;  Surgeon: Cleon Gustin, MD;   Location[facility];  Service: Urology;  Laterality: Bilateral;   CYSTOSCOPY WITH BIOPSY N/A 07/27/2017   Procedure: CYSTOSCOPY WITH BIOPSY OF BLADDER AND PROSTATIC URETHRA;  Surgeon: Cleon Gustin, MD;  Location[facility];  Service: Urology;  Laterality: N/A;   CYSTOSCOPY WITH BIOPSY Bilateral 12/08/2017   Procedure: CYSTOSCOPY WITH RENAL WASHINGS;  Surgeon: Cleon Gustin, MD;  Location: [facility];  Service: Urology;  Laterality: Bilateral;   CYSTOSCOPY WITH RETROGRADE PYELOGRAM, URETEROSCOPY AND STENT PLACEMENT Left 11/26/2020   Procedure: CYSTOSCOPY WITH LEFT RETROGRADE PYELOGRAM, URETEROSCOPY, BRUSH BIOPSY, TURBT;  Surgeon: Franchot Gallo, MD;  Location: [facility];  Service: Urology;  Laterality: Left;   FIBEROPTIC BRONCHOSCOPY  08-20-2005   dr wert   w/ Left lower lobe bx   PARATHYROIDECTOMY  2003   RADIOACTIVE PROSTATE SEED IMPLANTS  04-05-2001    dr Diona Fanti [place]  child   TRANSURETHRAL RESECTION OF BLADDER TUMOR  11-24-2014   dr Anabel Bene [facility] in [place], [place] OF BLADDER TUMOR N/A 04/08/2018   Procedure: TRANSURETHRAL RESECTION OF BLADDER TUMOR (TURBT);  Surgeon: Cleon Gustin, MD;  Location: [facility];  Service: Urology;  Laterality: N/A;   TRANSURETHRAL RESECTION OF PROSTATE N/A 02/28/2019   Procedure: TRANSURETHRAL RESECTION OF THE PROSTATE (TURP);  Surgeon: Cleon Gustin, MD;  Location: [facility];  Service: Urology;  Laterality: N/A;  30 MINS   UPPER GI ENDOSCOPY      FAMILY HISTORY History reviewed. No pertinent family history.  SOCIAL HISTORY Social History   Tobacco Use   Smoking status: Former    Years: 50.00    Pack years: 0.00    Types: Cigarettes    Quit date: 11/03/1984    Years since quitting: 36.5   Smokeless tobacco: Never  Vaping Use   Vaping Use: Never used  Substance Use Topics   Alcohol use: Yes     Alcohol/week: 7.0 standard drinks    Types: 7 Glasses of wine per week    Comment: daily wine   Drug use: No         OPHTHALMIC EXAM:  Base Eye Exam     Visual Acuity (ETDRS)       Right Left   Dist cc CF at 3' 20/40 -2   Dist ph cc NI NI    Correction: Glasses         Tonometry (Tonopen, 2:51 PM)       Right Left   Pressure 09 08         Pupils       Pupils Dark Light  Shape React APD   Right PERRL 3 3 Round Minimal None   Left PERRL 3 3 Round Minimal None         Visual Fields (Counting fingers)       Left Right    Full Full         Extraocular Movement       Right Left    Full Full         Neuro/Psych     Oriented x3: Yes   Mood/Affect: Normal         Dilation     Left eye: 1.0% Mydriacyl, 2.5% Phenylephrine @ 2:51 PM           Slit Lamp and Fundus Exam     Slit Lamp Exam       Right Left   Lids/Lashes Normal Normal   Conjunctiva/Sclera White and quiet White and quiet   Cornea Clear Clear   Anterior Chamber Deep and quiet Deep and quiet   Iris Round and reactive Round and reactive   Lens Posterior chamber intraocular lens Posterior chamber intraocular lens   Anterior Vitreous Normal Normal         Fundus Exam       Right Left   Posterior Vitreous  Posterior vitreous detachment   Disc  Normal   C/D Ratio  0.3   Macula  Hard drusen, no exudates, no hemorrhage, new macular thickening, Retinal pigment epithelial mottling   Vessels  Normal   Periphery  Normal            IMAGING AND PROCEDURES  Imaging and Procedures for 05/02/21  OCT, Retina - OU - Both Eyes       Right Eye Quality was good. Scan locations included subfoveal. Central Foveal Thickness: 473. Progression has been stable. Findings include epiretinal membrane, cystoid macular edema, subretinal scarring, disciform scar.   Left Eye Quality was good. Scan locations included subfoveal. Central Foveal Thickness: 272. Progression has improved.  Findings include pigment epithelial detachment, no SRF, no IRF.   Notes OD with central CME, persistent with severe epiretinal membrane.  Nonetheless outer retinal scarring precludes any attempted surgery at this point in the right eye for epiretinal membrane removal.  Vastly improved improved macular anatomy with smaller PED and smaller amount of subretinal fluid In the foveal location status post injection #3 Avastin.  We will repeat injection today with Eylea     Intravitreal Injection, Pharmacologic Agent - OS - Left Eye       Time Out 05/02/2021. 3:42 PM. Confirmed correct patient, procedure, site, and patient consented.   Anesthesia Topical anesthesia was used. Anesthetic medications included [place] 3.5%.   Procedure Preparation included Tobramycin 0.3%, Ofloxacin , 10% betadine to eyelids, 5% betadine to ocular surface. A 30 gauge needle was used.   Injection: 2 mg aflibercept 2 MG/0.05ML   Route: Intravitreal, Site: Left Eye   NDC: [id number], Lot: [id number], Waste: 0 mL   Post-op Post injection exam found visual acuity of at least counting fingers. The patient tolerated the procedure well. There were no complications. The patient received written and verbal post procedure care education. Post injection medications were not given.              ASSESSMENT/PLAN:  Right epiretinal membrane Severe epiretinal membrane but with outer retinal scarring and subfoveal scarring, not amenable to treatment at this time  Exudative age-related macular degeneration of left eye with active choroidal neovascularization ([place]) Slightly improved but with less  subretinal fluid associated with vascularized pigment epithelial detachment subfoveal.  Acuity stabilized.  Post Avastin No. 2.  We will repeat injection OS today with Eylea for the pigment epithelial detachment resolution attempt     ICD-10-CM   1. Exudative age-related macular degeneration of left eye with active choroidal  neovascularization (HCC)  H35.3221 OCT, Retina - OU - Both Eyes    Intravitreal Injection, Pharmacologic Agent - OS - Left Eye    aflibercept (EYLEA) SOLN 2 mg    2. Right epiretinal membrane  H35.371       1.  Wet AMD OS with preserved acuity OS, slightly improved post Avastin No. 2 yet with persistent subfoveal vascularized pigment epithelial detachment.  Will change to [facility] today  2.  Dilate OS next in 5 weeks likely injection Eylea OS  3.  Ophthalmic Meds Ordered this visit:  Meds ordered this encounter  Medications   aflibercept (EYLEA) SOLN 2 mg       Return in about 5 weeks (around 06/06/2021) for dilate, OS, EYLEA OCT.  There are no Patient Instructions on file for this visit.   Explained the diagnoses, plan, and follow up with the patient and they expressed understanding.  Patient expressed understanding of the importance of proper follow up care.   Clent Demark Zani Kyllonen M.D. Diseases & Surgery of the Retina and Vitreous [facility] [place] 05/02/21     Abbreviations: M myopia (nearsighted); A astigmatism; H hyperopia (farsighted); P presbyopia; Mrx spectacle prescription;  CTL contact lenses; OD right eye; OS left eye; OU both eyes  XT exotropia; ET esotropia; PEK punctate epithelial keratitis; PEE punctate epithelial erosions; DES dry eye syndrome; MGD meibomian gland dysfunction; ATs artificial tears; PFAT's preservative free artificial tears; [place] nuclear sclerotic cataract; PSC posterior subcapsular cataract; ERM epi-retinal membrane; PVD posterior vitreous detachment; RD retinal detachment; DM diabetes mellitus; DR diabetic retinopathy; NPDR non-proliferative diabetic retinopathy; PDR proliferative diabetic retinopathy; CSME clinically significant macular edema; DME diabetic macular edema; dbh dot blot hemorrhages; CWS cotton wool spot; POAG primary open angle glaucoma; C/D cup-to-disc ratio; HVF humphrey visual field; GVF goldmann visual field; OCT optical  coherence tomography; IOP intraocular pressure; BRVO Branch retinal vein occlusion; CRVO central retinal vein occlusion; CRAO central retinal artery occlusion; BRAO branch retinal artery occlusion; RT retinal tear; SB scleral buckle; PPV pars plana vitrectomy; VH Vitreous hemorrhage; PRP panretinal laser photocoagulation; IVK intravitreal kenalog; VMT vitreomacular traction; MH Macular hole;  NVD neovascularization of the disc; NVE neovascularization elsewhere; AREDS age related eye disease study; ARMD age related macular degeneration; POAG primary open angle glaucoma; EBMD epithelial/anterior basement membrane dystrophy; ACIOL anterior chamber intraocular lens; IOL intraocular lens; PCIOL posterior chamber intraocular lens; Phaco/IOL phacoemulsification with intraocular lens placement; [place] photorefractive keratectomy; LASIK laser assisted in situ keratomileusis; HTN hypertension; DM diabetes mellitus; COPD chronic obstructive pulmonary disease

## 2021-05-02 NOTE — Assessment & Plan Note (Signed)
Slightly improved but with less subretinal fluid associated with vascularized pigment epithelial detachment subfoveal.  Acuity stabilized.  Post Avastin No. 2.  We will repeat injection OS today with Eylea for the pigment epithelial detachment resolution attempt

## 2021-05-02 NOTE — Assessment & Plan Note (Signed)
Severe epiretinal membrane but with outer retinal scarring and subfoveal scarring, not amenable to treatment at this time

## 2021-05-07 ENCOUNTER — Encounter: Payer: Self-pay | Admitting: Nurse Practitioner

## 2021-05-07 ENCOUNTER — Non-Acute Institutional Stay: Payer: Medicare Other | Admitting: Nurse Practitioner

## 2021-05-07 DIAGNOSIS — N400 Enlarged prostate without lower urinary tract symptoms: Secondary | ICD-10-CM | POA: Diagnosis not present

## 2021-05-07 DIAGNOSIS — F419 Anxiety disorder, unspecified: Secondary | ICD-10-CM

## 2021-05-07 DIAGNOSIS — C678 Malignant neoplasm of overlapping sites of bladder: Secondary | ICD-10-CM

## 2021-05-07 DIAGNOSIS — N1832 Chronic kidney disease, stage 3b: Secondary | ICD-10-CM | POA: Diagnosis not present

## 2021-05-07 DIAGNOSIS — J439 Emphysema, unspecified: Secondary | ICD-10-CM | POA: Diagnosis not present

## 2021-05-07 NOTE — Assessment & Plan Note (Signed)
Appears anxious, continue Librium.

## 2021-05-07 NOTE — Assessment & Plan Note (Signed)
DOE, prn Proair, saw [employer], pleural effusion, fluid was exudative, CT chest extensive emphysema, large left pleural effusion, had thoracentesis. COPD, started rescue inhaler Albuterol prn, , graded physical activity, no O2 supplement needed.

## 2021-05-07 NOTE — Assessment & Plan Note (Signed)
Urinary frequency, continue Flomax.

## 2021-05-07 NOTE — Assessment & Plan Note (Signed)
Bun/creat 19/1.51 eGFR 41 03/21/21

## 2021-05-07 NOTE — Assessment & Plan Note (Signed)
lower abd pain, hx of urothelia cancer,  hematuria, urinary frequency, prn Oxycodone effective, but the patient decline more aggressive pain management, desires f/u Urology. On Flomax.

## 2021-05-07 NOTE — Progress Notes (Signed)
Location:   [facility] Room Number: 36 Place of Service:  ALF [phone]) Provider:  Marda Stalker, Lennie Odor NP   Virgie Dad, MD  Patient Care Team: Virgie Dad, MD as PCP - General (Internal Medicine) Ezekiel Slocumb, NP as Nurse Practitioner [facility] and Palliative Medicine)  Extended Emergency Contact Information Primary Emergency Contact: Mellissa Kohut of [place] Phone: [phone] Mobile Phone: [phone] Relation: Son Secondary Emergency Contact: Cranford,Ann Address: [address] [place]          [place], [place] [address] Johnnette Litter of [place] Phone: [phone] Relation: Spouse  Code Status:  Full code Goals of care: Advanced Directive information Advanced Directives 05/07/2021  Does Patient Have a Medical Advance Directive? Yes  Type of Advance Directive Out of facility DNR (pink MOST or yellow form)  Does patient want to make changes to medical advance directive? No - Patient declined  Copy of [place] in Chart? -  Would patient like information on creating a medical advance directive? -  Pre-existing out of facility DNR order (yellow form or pink MOST form) Pink MOST form placed in chart (order not valid for inpatient use);Yellow form placed in chart (order not valid for inpatient use)     Chief Complaint  Patient presents with   Acute Visit    Abdominal pain    HPI:  Pt is a 87 y.o. male seen today for an acute visit for lower abd pain, hx of urothelia cancer,  hematuria, urinary frequency, prn Oxycodone effective, but the patient decline more aggressive pain management, desires f/u Urology. On Flomax.   DOE, prn Proair, saw [employer], pleural effusion, fluid was exudative, CT chest extensive emphysema, large left pleural effusion, had thoracentesis. COPD, started rescue inhaler Albuterol prn, , graded physical activity, no O2 supplement needed.              Hx of BPH, on  Flomax             Hx of anxiety, stable, on Librium 67m qd.   CKD Bun/creat 19/1.51 eGFR 41 03/21/21   Past Medical History:  Diagnosis Date   Anxiety    Asthma    as child   Bladder cancer ([facility]rolog[facility]-  [facility] [place]   dx 01/ 2016--- post TURBT and post Bladder bx 02-10-2017   Bladder tumor    BPH (benign prostatic hyperplasia)    Frequency of urination    History of asthma    childhood   History of external beam radiation therapy    2002  prostate/ pelvis and boost w/ radioactive prostate seed implants    History of hypercalcemia    s/p  parathyroidectomy 2003   History of prostate cancer current urologist-  [facility] [place]-- per lov note (care everywhere) last PSA undetectable   dx 2002--  urologist-- dr dahlstedt/ oncologist dr mValere Dross--  Gleason 7 out of 10, PSA 4.9---post Radioactive Prostate Seed Implant and External Beam Radiation therapy   History of skin cancer    Hyperlipidemia    Multiple lung nodules    since 2006--- followed by pcp --- dr kim   Pneumonia    Renal cyst, left    Wears glasses    Wears hearing aid in both ears    Past Surgical History:  Procedure Laterality Date   CARDIOVASCULAR STRESS TEST  01/01/2004   normal nuclear  perfustion study w/ no ischemia/  normal LV function and wall motion, ef 65%   CATARACT EXTRACTION W/ INTRAOCULAR LENS  IMPLANT, BILATERAL  2010   COLONOSCOPY     CYSTO/  BILATERAL RETROGRADE PYELOGRAM/  BLADDER BX'S AND FULGERATION  02-10-2017   dr Anabel Bene [facility] [place] W/ RETROGRADES Bilateral 07/27/2017   Procedure: CYSTOSCOPY,SELECTIVE CYTOLOGIES,RETROGRADE PYELOGRAM;  Surgeon: Cleon Gustin, MD;  Location: [facility];  Service: Urology;  Laterality: Bilateral;   CYSTOSCOPY W/ RETROGRADES Bilateral 12/08/2017   Procedure: CYSTOSCOPY WITH RETROGRADE PYELOGRAM;  Surgeon: Cleon Gustin, MD;  Location:  [facility];  Service: Urology;  Laterality: Bilateral;   CYSTOSCOPY WITH BIOPSY N/A 07/27/2017   Procedure: CYSTOSCOPY WITH BIOPSY OF BLADDER AND PROSTATIC URETHRA;  Surgeon: Cleon Gustin, MD;  Location: [facility];  Service: Urology;  Laterality: N/A;   CYSTOSCOPY WITH BIOPSY Bilateral 12/08/2017   Procedure: CYSTOSCOPY WITH RENAL WASHINGS;  Surgeon: Cleon Gustin, MD;  Location: [facility];  Service: Urology;  Laterality: Bilateral;   CYSTOSCOPY WITH RETROGRADE PYELOGRAM, URETEROSCOPY AND STENT PLACEMENT Left 11/26/2020   Procedure: CYSTOSCOPY WITH LEFT RETROGRADE PYELOGRAM, URETEROSCOPY, BRUSH BIOPSY, TURBT;  Surgeon: Franchot Gallo, MD;  Location: [facility];  Service: Urology;  Laterality: Left;   FIBEROPTIC BRONCHOSCOPY  08-20-2005   dr wert   w/ Left lower lobe bx   PARATHYROIDECTOMY  2003   RADIOACTIVE PROSTATE SEED IMPLANTS  04-05-2001    dr Diona Fanti [place]  child   TRANSURETHRAL RESECTION OF BLADDER TUMOR  11-24-2014   dr Anabel Bene [facility] in [place], [place] OF BLADDER TUMOR N/A 04/08/2018   Procedure: TRANSURETHRAL RESECTION OF BLADDER TUMOR (TURBT);  Surgeon: Cleon Gustin, MD;  Location: [facility];  Service: Urology;  Laterality: N/A;   TRANSURETHRAL RESECTION OF PROSTATE N/A 02/28/2019   Procedure: TRANSURETHRAL RESECTION OF THE PROSTATE (TURP);  Surgeon: Cleon Gustin, MD;  Location: [facility];  Service: Urology;  Laterality: N/A;  30 MINS   UPPER GI ENDOSCOPY      Allergies  Allergen Reactions   Adhesive [Tape] Other (See Comments)    Tears skin off PAPER TAPE IS OKAY    Allergies as of 05/07/2021       Reactions   Adhesive [tape] Other (See Comments)   Tears skin off PAPER TAPE IS OKAY        Medication List        Accurate as of May 07, 2021 12:49 PM. If you have any questions, ask your nurse or doctor.           albuterol 108 (90 Base) MCG/ACT inhaler Commonly known as: VENTOLIN HFA Inhale 2 puffs into the lungs every 6 (six) hours as needed.   chlordiazePOXIDE 10 MG capsule Commonly known as: LIBRIUM Take 1 capsule (10 mg total) by mouth daily.   OxyCODONE HCl (Abuse Deter) 5 MG Taba Commonly known as: OXAYDO Take 5 mg by mouth every 4 (four) hours as needed.   oxyCODONE 10 mg 12 hr tablet Commonly known as: OXYCONTIN Take 10 mg by mouth in the morning and at bedtime.   solifenacin 10 MG tablet Commonly known as: VESICARE Take 10 mg by mouth daily. In the morning.   tamsulosin 0.4 MG Caps capsule Commonly known as: FLOMAX Take 0.8 mg by mouth daily.        Review of Systems  Constitutional:  Positive for appetite change. Negative for fatigue.       Frail.   HENT:  Positive for hearing loss. Negative for congestion and voice change.   Eyes:  Negative for visual disturbance.  Respiratory:  Positive for shortness of breath. Negative for cough and wheezing.        DOE  Cardiovascular:  Negative for leg swelling.  Gastrointestinal:  Positive for abdominal pain. Negative for constipation, diarrhea, nausea and vomiting.  Genitourinary:  Positive for frequency and hematuria. Negative for dysuria and urgency.  Musculoskeletal:  Positive for gait problem.  Skin:  Negative for color change.  Neurological:  Negative for speech difficulty, weakness and light-headedness.  Psychiatric/Behavioral:  Negative for confusion, dysphoric mood and sleep disturbance. The patient is not nervous/anxious.    Immunization History  Administered Date(s) Administered   Influenza, High Dose Seasonal PF 08/04/2019   Influenza,inj,Quad PF,6+ Mos 08/05/2018   Influenza-Unspecified 07/24/2020   Moderna Sars-Covid-2 Vaccination 11/07/2019, 12/05/2019, 09/17/2020   Pneumococcal Conjugate-13 05/19/2017   Pneumococcal Polysaccharide-23 07/19/2012   Pertinent  Health Maintenance Due  Topic Date Due    INFLUENZA VACCINE  06/03/2021   PNA vac Low Risk Adult  Completed   Fall Risk  11/14/2020 10/31/2020 06/05/2020 05/16/2020 05/02/2020  Falls in the past year? 0 0 0 0 0  Number falls in past yr: 0 0 0 0 0   Functional Status Survey:    Vitals:   05/07/21 1137  BP: 111/63  Pulse: 80  Resp: 18  Temp: 97.9 F (36.6 C)  SpO2: 97%  Weight: 105 lb 14.4 oz (48 kg)  Height: 5' 5.5" (1.664 m)   Body mass index is 17.35 kg/m. Physical Exam Vitals and nursing note reviewed.  Constitutional:      Appearance: Normal appearance.  HENT:     Head: Normocephalic and atraumatic.     Nose: Nose normal.     Mouth/Throat:     Mouth: Mucous membranes are moist.  Eyes:     Extraocular Movements: Extraocular movements intact.     Conjunctiva/sclera: Conjunctivae normal.     Pupils: Pupils are equal, round, and reactive to light.  Cardiovascular:     Rate and Rhythm: Normal rate and regular rhythm.     Heart sounds: No murmur heard.    Comments: Baseline HR 90-100  Pulmonary:     Breath sounds: Normal breath sounds. No wheezing or rales.  Abdominal:     General: Bowel sounds are normal.     Palpations: Abdomen is soft.     Tenderness: There is abdominal tenderness.     Comments: Lower abd pain palpated, 5/10 upon my examination.   Musculoskeletal:     Cervical back: Normal range of motion and neck supple.     Right lower leg: No edema.     Left lower leg: No edema.  Skin:    General: Skin is warm and dry.  Neurological:     General: No focal deficit present.     Mental Status: He is alert and oriented to person, place, and time. Mental status is at baseline.     Motor: No weakness.     Coordination: Coordination normal.     Gait: Gait abnormal.  Psychiatric:        Mood and Affect: Mood normal.        Behavior: Behavior normal.        Thought Content: Thought content normal.        Judgment: Judgment normal.  Labs reviewed: Recent Labs    06/19/20 1349 11/18/20 1429  03/21/21 1201  NA 140 141 139  K 4.3 4.2 4.1  CL 105 104 102  CO2 '28 30 23  ' GLUCOSE 120* 108* 93  BUN 26* 24* 19  CREATININE 1.45* 1.74* 1.51*  CALCIUM 8.4* 8.8* 9.5   Recent Labs    05/31/20 1354 11/18/20 1429 03/21/21 1201  AST '15 23 12  ' ALT 11 24 8*  ALKPHOS  --  71  --   BILITOT 0.5 0.7 0.5  PROT 6.3 6.9 6.6  ALBUMIN  --  4.3  --    Recent Labs    05/31/20 1354 11/18/20 1429 03/21/21 1201  WBC 7.5 6.6 8.3  NEUTROABS 4,035 4.3 4,822  HGB 12.6* 13.6 14.4  HCT 38.6 41.0 43.3  MCV 88.1 93.8 91.5  PLT 258 193 222   Lab Results  Component Value Date   TSH 1.57 03/21/2021   No results found for: HGBA1C Lab Results  Component Value Date   CHOL 163 03/21/2021   HDL 65 03/21/2021   LDLCALC 79 03/21/2021   TRIG 100 03/21/2021   CHOLHDL 2.5 03/21/2021    Significant Diagnostic Results in last 30 days:  Intravitreal Injection, Pharmacologic Agent - OS - Left Eye  Result Date: 05/02/2021 Time Out 05/02/2021. 3:42 PM. Confirmed correct patient, procedure, site, and patient consented. Anesthesia Topical anesthesia was used. Anesthetic medications included Akten 3.5%. Procedure Preparation included Tobramycin 0.3%, Ofloxacin , 10% betadine to eyelids, 5% betadine to ocular surface. A 30 gauge needle was used. Injection: 2 mg aflibercept 2 MG/0.05ML   Route: Intravitreal, Site: Left Eye   NDC: [id number], Lot: [id number], Waste: 0 mL Post-op Post injection exam found visual acuity of at least counting fingers. The patient tolerated the procedure well. There were no complications. The patient received written and verbal post procedure care education. Post injection medications were not given.   OCT, Retina - OU - Both Eyes  Result Date: 05/02/2021 Right Eye Quality was good. Scan locations included subfoveal. Central Foveal Thickness: 473. Progression has been stable. Findings include epiretinal membrane, cystoid macular edema, subretinal scarring, disciform scar. Left Eye  Quality was good. Scan locations included subfoveal. Central Foveal Thickness: 272. Progression has improved. Findings include pigment epithelial detachment, no SRF, no IRF. Notes OD with central CME, persistent with severe epiretinal membrane.  Nonetheless outer retinal scarring precludes any attempted surgery at this point in the right eye for epiretinal membrane removal. Vastly improved improved macular anatomy with smaller PED and smaller amount of subretinal fluid In the foveal location status post injection #3 Avastin.  We will repeat injection today with Eylea   Assessment/Plan Malignant neoplasm of overlapping sites of bladder (HCC)  lower abd pain, hx of urothelia cancer,  hematuria, urinary frequency, prn Oxycodone effective, but the patient decline more aggressive pain management, desires f/u Urology. On Flomax.   COPD (chronic obstructive pulmonary disease) with emphysema (HCC) DOE, prn Proair, saw  Pulmonology, pleural effusion, fluid was exudative, CT chest extensive emphysema, large left pleural effusion, had thoracentesis. COPD, started rescue inhaler Albuterol prn, , graded physical activity, no O2 supplement needed.   BPH (benign prostatic hyperplasia) Urinary frequency, continue Flomax.   Kidney disease, chronic, stage III (GFR 30-59 ml/min) Bun/creat 19/1.51 eGFR 41 03/21/21  Anxiety Appears anxious, continue Librium.     Family/ staff Communication: plan of care reviewed with the patient and charge nurse.   Labs/tests ordered:   none  Time spend 40 minutes.

## 2021-05-23 ENCOUNTER — Telehealth: Payer: Medicare Other | Admitting: Internal Medicine

## 2021-05-23 MED ORDER — OXYCODONE HCL 10 MG PO TABS: 10.0000 mg | ORAL_TABLET | Freq: Two times a day (BID) | ORAL | 0 refills | Status: DC | PRN

## 2021-05-23 NOTE — Telephone Encounter (Signed)
Discussed with Patient who continues to have lot of Pain due to his Bladder cancer. Wants to know if he can take extra PRN oxycodone.  D/W Nurses Order written to increase PRN oxycodone to 10 mg BID PRN. Continue '5mg'$  Q 4 hours. This is per his request Will not change OxyContin dose yet but that would be next step.

## 2021-06-06 ENCOUNTER — Encounter (INDEPENDENT_AMBULATORY_CARE_PROVIDER_SITE_OTHER): Payer: Medicare Other | Admitting: Ophthalmology

## 2021-06-06 ENCOUNTER — Non-Acute Institutional Stay: Payer: Medicare Other | Admitting: Internal Medicine

## 2021-06-06 ENCOUNTER — Encounter: Payer: Self-pay | Admitting: Internal Medicine

## 2021-06-06 DIAGNOSIS — N1832 Chronic kidney disease, stage 3b: Secondary | ICD-10-CM

## 2021-06-06 DIAGNOSIS — C678 Malignant neoplasm of overlapping sites of bladder: Secondary | ICD-10-CM

## 2021-06-06 DIAGNOSIS — J439 Emphysema, unspecified: Secondary | ICD-10-CM

## 2021-06-06 DIAGNOSIS — F419 Anxiety disorder, unspecified: Secondary | ICD-10-CM

## 2021-06-06 DIAGNOSIS — R5381 Other malaise: Secondary | ICD-10-CM

## 2021-06-06 DIAGNOSIS — R634 Abnormal weight loss: Secondary | ICD-10-CM

## 2021-06-06 DIAGNOSIS — N400 Enlarged prostate without lower urinary tract symptoms: Secondary | ICD-10-CM | POA: Diagnosis not present

## 2021-06-06 NOTE — Progress Notes (Signed)
Location:   [facility] Room Number: 36 Place of Service:  ALF [phone]) Provider:  Veleta Miners MD  Virgie Dad, MD  Patient Care Team: Virgie Dad, MD as PCP - General (Internal Medicine) Ezekiel Slocumb, NP as Nurse Practitioner [facility] and Palliative Medicine)  Extended Emergency Contact Information Primary Emergency Contact: Mellissa Kohut of [place] Phone: [phone] Mobile Phone: [phone] Relation: Son Secondary Emergency Contact: Cranford,Ann Address: [address] [place]          [place], [place] [address] Johnnette Litter of [place] Phone: [phone] Relation: Spouse  Code Status:  DNR Goals of care: Advanced Directive information Advanced Directives 06/06/2021  Does Patient Have a Medical Advance Directive? Yes  Type of Advance Directive Out of facility DNR (pink MOST or yellow form)  Does patient want to make changes to medical advance directive? No - Patient declined  Copy of [place] in Chart? -  Would patient like information on creating a medical advance directive? -  Pre-existing out of facility DNR order (yellow form or pink MOST form) Pink MOST form placed in chart (order not valid for inpatient use);Yellow form placed in chart (order not valid for inpatient use)     Chief Complaint  Patient presents with   Acute Visit    Pain control     HPI:  Pt is a 87 y.o. male seen today for an acute visit for Pain associated with his cancer. He also wanted to talk about his long-term care insurance and care for his wife with overall prognosis.  Patient has PMH  Covid Pneumonia BPH and h/o Bladder cancer, HLD and Depression and COPD  Was having Hematuria CT scan  showed Multifocal Urothelial Cancer Had Cystoscopy on 01/24. Since then he has been Diagnosed with Obstructive Urothelial Cancer of Left Urethra with Muscle invasive cancer of Bladder  Due to his cancer patient has  had issues with pain and hematuria.  He is on OxyContin and oxycodone.  Also on Flomax and Vesicare His hematuria is better in past few weeks back pain states that around 7-8 out of 10.  He has been managing his own pain medications under the supervision of speech therapy.  And per ST he has been doing a good job.  Patient is almost now taking 15 mg of oxycodone every 6 hours.  He states his pain does get better but then comes back.  He says he is never without the pain less than 3-4. Patient is working with the Education officer, museum in the facility for his long-term care papers.  He is pleased that his wife who has dementia and COPD is adjusting well to assisted living Patient continues to walk without any assist no falls.  Is driving with help of his son's.  Cognitively staying intact. Has lost 5 more pounds since my last visit Past Medical History:  Diagnosis Date   Anxiety    Asthma    as child   Bladder cancer [facility]) urologist-  dr Elliot Gault [facility] in [place], [place])   dx 01/ 2016--- post TURBT and post Bladder bx 02-10-2017   Bladder tumor    BPH (benign prostatic hyperplasia)    Frequency of urination    History of asthma    childhood   History of external beam radiation therapy    2002  prostate/ pelvis and boost w/ radioactive prostate seed implants    History of hypercalcemia  s/p  parathyroidectomy 2003   History of prostate cancer current urologist-  dr Elliot Gault [facility] in [place], [place])-- per lov note (care everywhere) last PSA undetectable   dx 2002--  urologist-- dr dahlstedt/ oncologist dr Valere Dross---  Gleason 7 out of 10, PSA 4.9---post Radioactive Prostate Seed Implant and External Beam Radiation therapy   History of skin cancer    Hyperlipidemia    Multiple lung nodules    since 2006--- followed by pcp --- dr Maudie Mercury   Pneumonia    Renal cyst, left    Wears glasses    Wears hearing aid in both ears    Past Surgical History:  Procedure Laterality  Date   CARDIOVASCULAR STRESS TEST  01/01/2004   normal nuclear perfustion study w/ no ischemia/  normal LV function and wall motion, ef 65%   CATARACT EXTRACTION W/ INTRAOCULAR LENS  IMPLANT, BILATERAL  2010   COLONOSCOPY     CYSTO/  BILATERAL RETROGRADE PYELOGRAM/  BLADDER BX'S AND FULGERATION  02-10-2017   dr Anabel Bene [facility] in [place], [place] W/ RETROGRADES Bilateral 07/27/2017   Procedure: CYSTOSCOPY,SELECTIVE CYTOLOGIES,RETROGRADE PYELOGRAM;  Surgeon: Cleon Gustin, MD;  Location: [facility];  Service: Urology;  Laterality: Bilateral;   CYSTOSCOPY W/ RETROGRADES Bilateral 12/08/2017   Procedure: CYSTOSCOPY WITH RETROGRADE PYELOGRAM;  Surgeon: Cleon Gustin, MD;  Location: [facility];  Service: Urology;  Laterality: Bilateral;   CYSTOSCOPY WITH BIOPSY N/A 07/27/2017   Procedure: CYSTOSCOPY WITH BIOPSY OF BLADDER AND PROSTATIC URETHRA;  Surgeon: Cleon Gustin, MD;  Location: [facility];  Service: Urology;  Laterality: N/A;   CYSTOSCOPY WITH BIOPSY Bilateral 12/08/2017   Procedure: CYSTOSCOPY WITH RENAL WASHINGS;  Surgeon: Cleon Gustin, MD;  Location: [facility];  Service: Urology;  Laterality: Bilateral;   CYSTOSCOPY WITH RETROGRADE PYELOGRAM, URETEROSCOPY AND STENT PLACEMENT Left 11/26/2020   Procedure: CYSTOSCOPY WITH LEFT RETROGRADE PYELOGRAM, URETEROSCOPY, BRUSH BIOPSY, TURBT;  Surgeon: Franchot Gallo, MD;  Location: [facility];  Service: Urology;  Laterality: Left;   FIBEROPTIC BRONCHOSCOPY  08-20-2005   dr wert   w/ Left lower lobe bx   PARATHYROIDECTOMY  2003   RADIOACTIVE PROSTATE SEED IMPLANTS  04-05-2001    dr Diona Fanti [facility]  child   TRANSURETHRAL RESECTION OF BLADDER TUMOR  11-24-2014   dr Anabel Bene [facility] in [place], [place] OF BLADDER TUMOR N/A 04/08/2018   Procedure: TRANSURETHRAL RESECTION OF BLADDER TUMOR (TURBT);   Surgeon: Cleon Gustin, MD;  Location: [facility];  Service: Urology;  Laterality: N/A;   TRANSURETHRAL RESECTION OF PROSTATE N/A 02/28/2019   Procedure: TRANSURETHRAL RESECTION OF THE PROSTATE (TURP);  Surgeon: Cleon Gustin, MD;  Location: [facility];  Service: Urology;  Laterality: N/A;  30 MINS   UPPER GI ENDOSCOPY      Allergies  Allergen Reactions   Adhesive [Tape] Other (See Comments)    Tears skin off PAPER TAPE IS OKAY    Allergies as of 06/06/2021       Reactions   Adhesive [tape] Other (See Comments)   Tears skin off PAPER TAPE IS OKAY        Medication List        Accurate as of June 06, 2021  2:55 PM. If you have any questions, ask your nurse or doctor.          STOP taking these medications    oxyCODONE  10 mg 12 hr tablet Commonly known as: OXYCONTIN Stopped by: Virgie Dad, MD   OxyCODONE HCl (Abuse Deter) 5 MG Taba Commonly known as: OXAYDO Stopped by: Virgie Dad, MD   Oxycodone HCl 10 MG Tabs Stopped by: Virgie Dad, MD       TAKE these medications    albuterol 108 (90 Base) MCG/ACT inhaler Commonly known as: VENTOLIN HFA Inhale 2 puffs into the lungs every 6 (six) hours as needed.   chlordiazePOXIDE 10 MG capsule Commonly known as: LIBRIUM Take 1 capsule (10 mg total) by mouth daily.   solifenacin 10 MG tablet Commonly known as: VESICARE Take 10 mg by mouth daily. In the morning.   tamsulosin 0.4 MG Caps capsule Commonly known as: FLOMAX Take 0.8 mg by mouth daily.        Review of Systems  Constitutional:  Positive for activity change and unexpected weight change.  HENT: Negative.    Respiratory:  Positive for cough and shortness of breath.   Cardiovascular: Negative.   Gastrointestinal:  Positive for abdominal pain.  Genitourinary:  Positive for dysuria.  Musculoskeletal: Negative.   Skin: Negative.   Neurological:  Positive for weakness.  Psychiatric/Behavioral: Negative.      Immunization History  Administered Date(s) Administered   Influenza, High Dose Seasonal PF 08/04/2019   Influenza,inj,Quad PF,6+ Mos 08/05/2018   Influenza-Unspecified 07/24/2020   Moderna Sars-Covid-2 Vaccination 11/07/2019, 12/05/2019, 09/17/2020   Pneumococcal Conjugate-13 05/19/2017   Pneumococcal Polysaccharide-23 07/19/2012   Pertinent  Health Maintenance Due  Topic Date Due   INFLUENZA VACCINE  06/03/2021   PNA vac Low Risk Adult  Completed   Fall Risk  11/14/2020 10/31/2020 06/05/2020 05/16/2020 05/02/2020  Falls in the past year? 0 0 0 0 0  Number falls in past yr: 0 0 0 0 0   Functional Status Survey:    Vitals:   06/06/21 1451  BP: 121/62  Pulse: 80  Resp: 20  Temp: 98.3 F (36.8 C)  SpO2: 97%  Weight: 105 lb 9.6 oz (47.9 kg)  Height: 5' 5.5" (1.664 m)   Body mass index is 17.31 kg/m. Physical Exam Vitals reviewed.  Constitutional:      Comments: Very frail   HENT:     Head: Normocephalic.     Mouth/Throat:     Mouth: Mucous membranes are moist.     Pharynx: Oropharynx is clear.  Eyes:     Pupils: Pupils are equal, round, and reactive to light.  Cardiovascular:     Rate and Rhythm: Normal rate and regular rhythm.     Pulses: Normal pulses.     Heart sounds: Normal heart sounds.  Pulmonary:     Effort: Pulmonary effort is normal.     Breath sounds: Normal breath sounds.  Abdominal:     General: Abdomen is flat.     Comments: Feels Discomfort in his Lower abdomen  Musculoskeletal:        General: No swelling.     Cervical back: Normal range of motion and neck supple.  Skin:    General: Skin is warm.  Neurological:     General: No focal deficit present.     Mental Status: He is alert and oriented to person, place, and time.  Psychiatric:        Mood and Affect: Mood normal.        Thought Content: Thought content normal.    Labs reviewed: Recent Labs    06/19/20 1349 11/18/20 1429 03/21/21 1201  NA 140 141 139  K 4.3 4.2 4.1  CL 105  104 102  CO2 '28 30 23  '$ GLUCOSE 120* 108* 93  BUN 26* 24* 19  CREATININE 1.45* 1.74* 1.51*  CALCIUM 8.4* 8.8* 9.5   Recent Labs    11/18/20 1429 03/21/21 1201  AST 23 12  ALT 24 8*  ALKPHOS 71  --   BILITOT 0.7 0.5  PROT 6.9 6.6  ALBUMIN 4.3  --    Recent Labs    11/18/20 1429 03/21/21 1201  WBC 6.6 8.3  NEUTROABS 4.3 4,822  HGB 13.6 14.4  HCT 41.0 43.3  MCV 93.8 91.5  PLT 193 222   Lab Results  Component Value Date   TSH 1.57 03/21/2021   No results found for: HGBA1C Lab Results  Component Value Date   CHOL 163 03/21/2021   HDL 65 03/21/2021   LDLCALC 79 03/21/2021   TRIG 100 03/21/2021   CHOLHDL 2.5 03/21/2021    Significant Diagnostic Results in last 30 days:  No results found.  Assessment/Plan Malignant neoplasm of overlapping sites of bladder (HCC) Will increase his Oxycontin to 15 mg BID and increase Oxycodone to 15 mg Q 4 hours Prn  So far he is having Minimum side effects due to Meds Talked to Nurse and Speech to evaluate his cognition with these changes Also Advised not to drive when on these Meds Patient doe shave Palliative involved   Pulmonary emphysema, unspecified emphysema type ([place]) Continues to have issues with SOB on walking long distance  On Albuterol per his Choice  Benign prostatic hyperplasia  On Vesicare and Flomax Stage 3b chronic kidney disease ([place]) Stable Anxiety On Librium Physical deconditioning Able to manage better in AL  Weight loss Continues to be the issue   Family/ staff Communication:   Labs/tests ordered:    Total time spent in this patient care encounter was  45_  minutes; greater than 50% of the visit spent counseling patient and staff, reviewing records , Labs and coordinating care for problems addressed at this encounter.

## 2021-06-07 MED ORDER — OXYCODONE HCL 15 MG PO TABS: 15.0000 mg | ORAL_TABLET | ORAL | 0 refills | Status: DC | PRN

## 2021-06-07 MED ORDER — OXYCODONE HCL ER 15 MG PO T12A: 15.0000 mg | EXTENDED_RELEASE_TABLET | Freq: Two times a day (BID) | ORAL | 0 refills | Status: DC

## 2021-06-11 ENCOUNTER — Ambulatory Visit (INDEPENDENT_AMBULATORY_CARE_PROVIDER_SITE_OTHER): Payer: Medicare Other | Admitting: Ophthalmology

## 2021-06-11 ENCOUNTER — Other Ambulatory Visit: Payer: Self-pay

## 2021-06-11 ENCOUNTER — Encounter (INDEPENDENT_AMBULATORY_CARE_PROVIDER_SITE_OTHER): Payer: Self-pay | Admitting: Ophthalmology

## 2021-06-11 DIAGNOSIS — H35371 Puckering of macula, right eye: Secondary | ICD-10-CM | POA: Diagnosis not present

## 2021-06-11 DIAGNOSIS — H353221 Exudative age-related macular degeneration, left eye, with active choroidal neovascularization: Secondary | ICD-10-CM | POA: Diagnosis not present

## 2021-06-11 DIAGNOSIS — H353212 Exudative age-related macular degeneration, right eye, with inactive choroidal neovascularization: Secondary | ICD-10-CM | POA: Diagnosis not present

## 2021-06-11 MED ORDER — AFLIBERCEPT 2MG/0.05ML IZ SOLN FOR KALEIDOSCOPE
2.0000 mg | INTRAVITREAL | Status: AC | PRN
  Administered 2021-06-11: 2 mg via INTRAVITREAL

## 2021-06-11 NOTE — Progress Notes (Signed)
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  Visual acuity per patient has remained relatively stable left eye       Last edited by Hurman Horn, MD on 06/11/2021  1:33 PM.      Referring physician: Virgie Dad, MD [place],  [place] [id number]  HISTORICAL INFORMATION:   Selected notes from the MEDICAL RECORD NUMBER       CURRENT MEDICATIONS: No current outpatient medications on file. (Ophthalmic Drugs)   No current facility-administered medications for this visit. (Ophthalmic Drugs)   Current Outpatient Medications (Other)  Medication Sig   albuterol (VENTOLIN HFA) 108 (90 Base) MCG/ACT inhaler Inhale 2 puffs into the lungs every 6 (six) hours as needed.   chlordiazePOXIDE (LIBRIUM) 10 MG capsule Take 1 capsule (10 mg total) by mouth daily.   oxyCODONE (OXYCONTIN) 15 mg 12 hr tablet Take 1 tablet (15 mg total) by mouth every 12 (twelve) hours.   oxyCODONE (ROXICODONE) 15 MG immediate release tablet Take 1 tablet (15 mg total) by mouth every 4 (four) hours as needed for pain.   solifenacin (VESICARE) 10 MG tablet Take 10 mg by mouth daily. In the morning.   tamsulosin (FLOMAX) 0.4 MG CAPS capsule Take 0.8 mg by mouth daily.   No current facility-administered medications for this visit. (Other)      REVIEW OF SYSTEMS:    ALLERGIES Allergies  Allergen Reactions   Adhesive [Tape] Other (See Comments)    Tears skin off PAPER TAPE IS OKAY    PAST MEDICAL  HISTORY Past Medical History:  Diagnosis Date   Anxiety    Asthma    as child   Bladder cancer [facility]) urologist-  dr Elliot Gault [facility] in [place], [place])   dx 01/ 2016--- post TURBT and post Bladder bx 02-10-2017   Bladder tumor    BPH (benign prostatic hyperplasia)    Frequency of urination    History of asthma    childhood   History of external beam radiation therapy    2002  prostate/ pelvis and boost w/ radioactive prostate seed implants    History of hypercalcemia    s/p  parathyroidectomy 2003   History of prostate cancer current urologist-  dr Elliot Gault [facility] in [place], [place])-- per lov note (care everywhere) last PSA undetectable   dx 2002--  urologist-- dr dahlstedt/ oncologist dr Valere Dross---  Gleason 7 out of 10, PSA 4.9---post Radioactive Prostate Seed Implant and External Beam Radiation therapy   History of skin cancer    Hyperlipidemia    Multiple lung nodules    since 2006--- followed by pcp --- dr kim   Pneumonia    Renal cyst, left    Wears glasses    Wears hearing aid in both ears    Past Surgical History:  Procedure Laterality Date   CARDIOVASCULAR STRESS TEST  01/01/2004   normal nuclear perfustion study  w/ no ischemia/  normal LV function and wall motion, ef 65%   CATARACT EXTRACTION W/ INTRAOCULAR LENS  IMPLANT, BILATERAL  2010   COLONOSCOPY     CYSTO/  BILATERAL RETROGRADE PYELOGRAM/  BLADDER BX'S AND FULGERATION  02-10-2017   dr Anabel Bene [facility] in [place] [employer], [place] W/ RETROGRADES Bilateral 07/27/2017   Procedure: CYSTOSCOPY,SELECTIVE CYTOLOGIES,RETROGRADE PYELOGRAM;  Surgeon: Cleon Gustin, MD;  Location: [facility];  Service: Urology;  Laterality: Bilateral;   CYSTOSCOPY W/ RETROGRADES Bilateral 12/08/2017   Procedure: CYSTOSCOPY WITH RETROGRADE PYELOGRAM;  Surgeon: Cleon Gustin, MD;  Location: [facility];  Service: Urology;  Laterality: Bilateral;   CYSTOSCOPY WITH BIOPSY N/A  07/27/2017   Procedure: CYSTOSCOPY WITH BIOPSY OF BLADDER AND PROSTATIC URETHRA;  Surgeon: Cleon Gustin, MD;  Location: [facility];  Service: Urology;  Laterality: N/A;   CYSTOSCOPY WITH BIOPSY Bilateral 12/08/2017   Procedure: CYSTOSCOPY WITH RENAL WASHINGS;  Surgeon: Cleon Gustin, MD;  Location: [facility];  Service: Urology;  Laterality: Bilateral;   CYSTOSCOPY WITH RETROGRADE PYELOGRAM, URETEROSCOPY AND STENT PLACEMENT Left 11/26/2020   Procedure: CYSTOSCOPY WITH LEFT RETROGRADE PYELOGRAM, URETEROSCOPY, BRUSH BIOPSY, TURBT;  Surgeon: Franchot Gallo, MD;  Location: [facility];  Service: Urology;  Laterality: Left;   FIBEROPTIC BRONCHOSCOPY  08-20-2005   dr wert   w/ Left lower lobe bx   PARATHYROIDECTOMY  2003   RADIOACTIVE PROSTATE SEED IMPLANTS  04-05-2001    dr Diona Fanti [place]  child   TRANSURETHRAL RESECTION OF BLADDER TUMOR  11-24-2014   dr Anabel Bene [facility] in [place], [place] OF BLADDER TUMOR N/A 04/08/2018   Procedure: TRANSURETHRAL RESECTION OF BLADDER TUMOR (TURBT);  Surgeon: Cleon Gustin, MD;  Location[facility];  Service: Urology;  Laterality: N/A;   TRANSURETHRAL RESECTION OF PROSTATE N/A 02/28/2019   Procedure: TRANSURETHRAL RESECTION OF THE PROSTATE (TURP);  Surgeon: Cleon Gustin, MD;  Location: [facility];  Service: Urology;  Laterality: N/A;  30 MINS   UPPER GI ENDOSCOPY      FAMILY HISTORY History reviewed. No pertinent family history.  SOCIAL HISTORY Social History   Tobacco Use   Smoking status: Former    Years: 50.00    Types: Cigarettes    Quit date: 11/03/1984    Years since quitting: 36.6   Smokeless tobacco: Never  Vaping Use   Vaping Use: Never used  Substance Use Topics   Alcohol use: Yes    Alcohol/week: 7.0 standard drinks    Types: 7 Glasses of wine per week    Comment: daily wine   Drug use: No          OPHTHALMIC EXAM:  Base Eye Exam     Visual Acuity (ETDRS)       Right Left   Dist cc CF at 3' 20/40 +2    Correction: Glasses         Tonometry (Tonopen, 1:30 PM)       Right Left   Pressure 15 12         Pupils       Pupils APD   Right PERRL None   Left PERRL None         Visual Fields       Left Right    Full    Restrictions  Central scotoma         Extraocular Movement  Right Left    Full, Ortho Full, Ortho         Neuro/Psych     Oriented x3: Yes   Mood/Affect: Normal         Dilation     Both eyes: 1.0% Mydriacyl, 2.5% Phenylephrine @ 1:45 PM           Slit Lamp and Fundus Exam     External Exam       Right Left   External Normal Normal         Slit Lamp Exam       Right Left   Lids/Lashes Normal Normal   Conjunctiva/Sclera White and quiet White and quiet   Cornea Clear Clear   Anterior Chamber Deep and quiet Deep and quiet   Iris Round and reactive Round and reactive   Lens Posterior chamber intraocular lens Posterior chamber intraocular lens   Anterior Vitreous Normal Normal         Fundus Exam       Right Left   Posterior Vitreous Posterior vitreous detachment Posterior vitreous detachment   Disc Normal Normal   C/D Ratio 0.3 0.3   Macula Disciform scar, Pigmented atrophy, Geographic atrophy in the faz Hard drusen, no exudates, no hemorrhage, new macular thickening, Retinal pigment epithelial mottling   Vessels Normal Normal   Periphery Normal Normal            IMAGING AND PROCEDURES  Imaging and Procedures for 06/11/21  OCT, Retina - OU - Both Eyes       Right Eye Quality was good. Scan locations included subfoveal. Central Foveal Thickness: 437. Progression has been stable. Findings include epiretinal membrane, cystoid macular edema, subretinal scarring, disciform scar.   Left Eye Quality was good. Scan locations included subfoveal. Central Foveal Thickness: 230. Progression has  improved. Findings include pigment epithelial detachment, no SRF, no IRF.   Notes OD with central CME, persistent with severe epiretinal membrane.  Nonetheless outer retinal scarring precludes any attempted surgery at this point in the right eye for epiretinal membrane removal.  Vastly improved improved macular anatomy with smaller PED and smaller amount of subretinal fluid In the foveal location status post injection Eylea we will repeat injection today with Eylea, currently at 5 weeks 5-day interval looks great as compared to onset of disease March 2022     Intravitreal Injection, Pharmacologic Agent - OS - Left Eye       Time Out 06/11/2021. 1:46 PM. Confirmed correct patient, procedure, site, and patient consented.   Anesthesia Topical anesthesia was used. Anesthetic medications included Akten 3.5%.   Procedure Preparation included Tobramycin 0.3%, Ofloxacin , 10% betadine to eyelids, 5% betadine to ocular surface. A 30 gauge needle was used.   Injection: 2 mg aflibercept 2 MG/0.05ML   Route: Intravitreal, Site: Left Eye   NDC: [id number], Lot: [id number], Waste: 0 mL   Post-op Post injection exam found visual acuity of at least counting fingers. The patient tolerated the procedure well. There were no complications. The patient received written and verbal post procedure care education. Post injection medications were not given.              ASSESSMENT/PLAN:  Exudative age-related macular degeneration of left eye with active choroidal neovascularization (HCC) Currently 5 weeks and 5 days after most recent intravitreal Eylea for wet AMD in this monocular patient, with OCT findings vastly improved as compared to onset March 2022.  No subretinal fluid, smaller PED present.  No intraretinal fluid.  Exudative age-related macular degeneration of right eye with inactive choroidal neovascularization (HCC) Chronic subfoveal not treatable, disciform wide scar not enlarging  Right  epiretinal membrane Severe yet with intrinsic retinal atrophy not amenable to surgical repair with the chance for visual acuity     ICD-10-CM   1. Exudative age-related macular degeneration of left eye with active choroidal neovascularization (HCC)  H35.3221 OCT, Retina - OU - Both Eyes    Intravitreal Injection, Pharmacologic Agent - OS - Left Eye    aflibercept (EYLEA) SOLN 2 mg    2. Right epiretinal membrane  H35.371 OCT, Retina - OU - Both Eyes    3. Exudative age-related macular degeneration of right eye with inactive choroidal neovascularization ([place])  H35.3212       1.  OS vastly improved since onset of therapy March 2022.  Much less subretinal fluid smaller PED.  We will repeat Eylea today current 6-week interval and maintain 6-week interval and this monocular patient.  2.  OD will observe no change over time  3.  Ophthalmic Meds Ordered this visit:  Meds ordered this encounter  Medications   aflibercept (EYLEA) SOLN 2 mg       Return in about 6 weeks (around 07/23/2021) for dilate, OS, EYLEA OCT.  There are no Patient Instructions on file for this visit.   Explained the diagnoses, plan, and follow up with the patient and they expressed understanding.  Patient expressed understanding of the importance of proper follow up care.   Clent Demark Keatin Benham M.D. Diseases & Surgery of the Retina and Vitreous Retina & Diabetic [place] 06/11/21     Abbreviations: M myopia (nearsighted); A astigmatism; H hyperopia (farsighted); P presbyopia; Mrx spectacle prescription;  CTL contact lenses; OD right eye; OS left eye; OU both eyes  XT exotropia; ET esotropia; PEK punctate epithelial keratitis; PEE punctate epithelial erosions; DES dry eye syndrome; MGD meibomian gland dysfunction; ATs artificial tears; PFAT's preservative free artificial tears; [place] nuclear sclerotic cataract; PSC posterior subcapsular cataract; ERM epi-retinal membrane; PVD posterior vitreous detachment; RD retinal  detachment; DM diabetes mellitus; DR diabetic retinopathy; NPDR non-proliferative diabetic retinopathy; PDR proliferative diabetic retinopathy; CSME clinically significant macular edema; DME diabetic macular edema; dbh dot blot hemorrhages; CWS cotton wool spot; POAG primary open angle glaucoma; C/D cup-to-disc ratio; HVF humphrey visual field; GVF goldmann visual field; OCT optical coherence tomography; IOP intraocular pressure; BRVO Branch retinal vein occlusion; CRVO central retinal vein occlusion; CRAO central retinal artery occlusion; BRAO branch retinal artery occlusion; RT retinal tear; SB scleral buckle; PPV pars plana vitrectomy; VH Vitreous hemorrhage; PRP panretinal laser photocoagulation; IVK intravitreal kenalog; VMT vitreomacular traction; MH Macular hole;  NVD neovascularization of the disc; NVE neovascularization elsewhere; AREDS age related eye disease study; ARMD age related macular degeneration; POAG primary open angle glaucoma; EBMD epithelial/anterior basement membrane dystrophy; ACIOL anterior chamber intraocular lens; IOL intraocular lens; PCIOL posterior chamber intraocular lens; Phaco/IOL phacoemulsification with intraocular lens placement; [place] photorefractive keratectomy; LASIK laser assisted in situ keratomileusis; HTN hypertension; DM diabetes mellitus; COPD chronic obstructive pulmonary disease

## 2021-06-11 NOTE — Assessment & Plan Note (Addendum)
Currently 5 weeks and 5 days after most recent intravitreal Eylea for wet AMD in this monocular patient, with OCT findings vastly improved as compared to onset March 2022.  No subretinal fluid, smaller PED present.  No intraretinal fluid.

## 2021-06-11 NOTE — Assessment & Plan Note (Addendum)
Chronic subfoveal not treatable, disciform wide scar not enlarging

## 2021-06-11 NOTE — Assessment & Plan Note (Signed)
Severe yet with intrinsic retinal atrophy not amenable to surgical repair with the chance for visual acuity

## 2021-06-13 ENCOUNTER — Non-Acute Institutional Stay: Payer: Medicare Other | Admitting: Internal Medicine

## 2021-06-13 ENCOUNTER — Encounter: Payer: Self-pay | Admitting: Internal Medicine

## 2021-06-13 DIAGNOSIS — F419 Anxiety disorder, unspecified: Secondary | ICD-10-CM

## 2021-06-13 DIAGNOSIS — C678 Malignant neoplasm of overlapping sites of bladder: Secondary | ICD-10-CM

## 2021-06-13 DIAGNOSIS — R634 Abnormal weight loss: Secondary | ICD-10-CM

## 2021-06-13 DIAGNOSIS — N1832 Chronic kidney disease, stage 3b: Secondary | ICD-10-CM

## 2021-06-13 DIAGNOSIS — N400 Enlarged prostate without lower urinary tract symptoms: Secondary | ICD-10-CM

## 2021-06-13 DIAGNOSIS — J439 Emphysema, unspecified: Secondary | ICD-10-CM

## 2021-06-13 MED ORDER — OXYCODONE HCL 10 MG PO TABS: 10.0000 mg | ORAL_TABLET | Freq: Four times a day (QID) | ORAL | 0 refills | Status: DC

## 2021-06-13 MED ORDER — OXYCODONE HCL 5 MG PO TABS: 5.0000 mg | ORAL_TABLET | ORAL | 0 refills | Status: DC | PRN

## 2021-06-13 NOTE — Progress Notes (Signed)
Location:   [place] Room Number: 36 Place of Service:  [phone]) Provider:  Veleta Miners MD  Virgie Dad, MD  Patient Care Team: Virgie Dad, MD as PCP - General (Internal Medicine) Ezekiel Slocumb, NP as Nurse Practitioner [facility] and Palliative Medicine)  Extended Emergency Contact Information Primary Emergency Contact: Mellissa Kohut of [place] Phone: [phone] Mobile Phone: [phone] Relation: Son Secondary Emergency Contact: Cranford,Ann Address: [address], [place]          [place], [place] [address] Johnnette Litter of [place] Phone: [phone] Relation: Spouse  Code Status:  DNR Goals of care: Advanced Directive information Advanced Directives 06/13/2021  Does Patient Have a Medical Advance Directive? Yes  Type of Advance Directive Out of facility DNR (pink MOST or yellow form)  Does patient want to make changes to medical advance directive? No - Patient declined  Copy of [place] in Chart? -  Would patient like information on creating a medical advance directive? -  Pre-existing out of facility DNR order (yellow form or pink MOST form) Pink MOST form placed in chart (order not valid for inpatient use);Yellow form placed in chart (order not valid for inpatient use)     Chief Complaint  Patient presents with   Acute Visit    Pain control     HPI:  Pt is a 87 y.o. male seen today for an acute visit for Pain Control  Patient has PMH  Covid Pneumonia BPH and h/o Bladder cancer, HLD and Depression and COPD   Was having Hematuria CT scan  showed Multifocal Urothelial Cancer Had Cystoscopy on 01/24. Since then he has been Diagnosed with Obstructive Urothelial Cancer of Left Urethra with Muscle invasive cancer of Bladder  He has Appointment with Urology to go over his treatment options next week. It seems that he is not good candidate for Surgery.  I had increased his  OxyContin last visit and he says that has not helped It is also costing him $200 per month He almost took 30 mg of Oxycodone last night due to pain being intense Dreads going to bed due to pain No Hematuria Recently  Patient is working with the Education officer, museum in the facility for his long-term care papers.  He is pleased that his wife who has dementia and COPD is adjusting well to assisted living Patient continues to walk without any assist no falls.  Is driving with help of his son's.  Cognitively staying intact.  Past Medical History:  Diagnosis Date   Anxiety    Asthma    as child   Bladder cancer [facility]) urologist-  dr Elliot Gault [facility] in [place], [place])   dx 01/ 2016--- post TURBT and post Bladder bx 02-10-2017   Bladder tumor    BPH (benign prostatic hyperplasia)    Frequency of urination    History of asthma    childhood   History of external beam radiation therapy    2002  prostate/ pelvis and boost w/ radioactive prostate seed implants    History of hypercalcemia    s/p  parathyroidectomy 2003   History of prostate cancer current urologist-  dr Elliot Gault [facility] in [place], [place])-- per lov note (care everywhere) last PSA undetectable   dx 2002--  urologist-- dr dahlstedt/ oncologist dr Valere Dross---  Gleason 7 out of 10, PSA 4.9---post Radioactive Prostate Seed Implant and External Beam Radiation therapy   History of  skin cancer    Hyperlipidemia    Multiple lung nodules    since 2006--- followed by pcp --- dr Maudie Mercury   Pneumonia    Renal cyst, left    Wears glasses    Wears hearing aid in both ears    Past Surgical History:  Procedure Laterality Date   CARDIOVASCULAR STRESS TEST  01/01/2004   normal nuclear perfustion study w/ no ischemia/  normal LV function and wall motion, ef 65%   CATARACT EXTRACTION W/ INTRAOCULAR LENS  IMPLANT, BILATERAL  2010   COLONOSCOPY     CYSTO/  BILATERAL RETROGRADE PYELOGRAM/  BLADDER BX'S AND FULGERATION  02-10-2017    dr Anabel Bene [facility] in [place], [place] Bilateral 07/27/2017   Procedure: CYSTOSCOPY,SELECTIVE CYTOLOGIES,RETROGRADE PYELOGRAM;  Surgeon: Cleon Gustin, MD;  Location: [facility];  Service: Urology;  Laterality: Bilateral;   CYSTOSCOPY W/ RETROGRADES Bilateral 12/08/2017   Procedure: CYSTOSCOPY WITH RETROGRADE PYELOGRAM;  Surgeon: Cleon Gustin, MD;  Location: [facility];  Service: Urology;  Laterality: Bilateral;   CYSTOSCOPY WITH BIOPSY N/A 07/27/2017   Procedure: CYSTOSCOPY WITH BIOPSY OF BLADDER AND PROSTATIC URETHRA;  Surgeon: Cleon Gustin, MD;  Location: [facility];  Service: Urology;  Laterality: N/A;   CYSTOSCOPY WITH BIOPSY Bilateral 12/08/2017   Procedure: CYSTOSCOPY WITH RENAL WASHINGS;  Surgeon: Cleon Gustin, MD;  Location: [facility];  Service: Urology;  Laterality: Bilateral;   CYSTOSCOPY WITH RETROGRADE PYELOGRAM, URETEROSCOPY AND STENT PLACEMENT Left 11/26/2020   Procedure: CYSTOSCOPY WITH LEFT RETROGRADE PYELOGRAM, URETEROSCOPY, BRUSH BIOPSY, TURBT;  Surgeon: Franchot Gallo, MD;  Location: [facility];  Service: Urology;  Laterality: Left;   FIBEROPTIC BRONCHOSCOPY  08-20-2005   dr wert   w/ Left lower lobe bx   PARATHYROIDECTOMY  2003   RADIOACTIVE PROSTATE SEED IMPLANTS  04-05-2001    dr Diona Fanti [place]  child   TRANSURETHRAL RESECTION OF BLADDER TUMOR  11-24-2014   dr Anabel Bene [facility] in [place], [place] OF BLADDER TUMOR N/A 04/08/2018   Procedure: TRANSURETHRAL RESECTION OF BLADDER TUMOR (TURBT);  Surgeon: Cleon Gustin, MD;  Location: [facility];  Service: Urology;  Laterality: N/A;   TRANSURETHRAL RESECTION OF PROSTATE N/A 02/28/2019   Procedure: TRANSURETHRAL RESECTION OF THE PROSTATE (TURP);  Surgeon: Cleon Gustin, MD;  Location: [facility];  Service: Urology;  Laterality: N/A;   30 MINS   UPPER GI ENDOSCOPY      Allergies  Allergen Reactions   Adhesive [Tape] Other (See Comments)    Tears skin off PAPER TAPE IS OKAY    Allergies as of 06/13/2021       Reactions   Adhesive [tape] Other (See Comments)   Tears skin off PAPER TAPE IS OKAY        Medication List        Accurate as of June 13, 2021  3:02 PM. If you have any questions, ask your nurse or doctor.          albuterol 108 (90 Base) MCG/ACT inhaler Commonly known as: VENTOLIN HFA Inhale 2 puffs into the lungs every 6 (six) hours as needed.   chlordiazePOXIDE 10 MG capsule Commonly known as: LIBRIUM Take 1 capsule (10 mg total) by mouth daily.   oxyCODONE 15 mg 12 hr tablet Commonly known as: OXYCONTIN Take 1 tablet (15 mg total) by mouth every 12 (twelve) hours. What changed: Another  medication with the same name was changed. Make sure you understand how and when to take each. Changed by: Virgie Dad, MD   oxyCODONE 5 MG immediate release tablet Commonly known as: Oxy IR/ROXICODONE Take 1 tablet (5 mg total) by mouth every 4 (four) hours as needed for severe pain. What changed:  medication strength how much to take reasons to take this Changed by: Virgie Dad, MD   solifenacin 10 MG tablet Commonly known as: VESICARE Take 10 mg by mouth daily. In the morning.   tamsulosin 0.4 MG Caps capsule Commonly known as: FLOMAX Take 0.8 mg by mouth daily.        Review of Systems  Constitutional:  Positive for activity change and unexpected weight change.  HENT: Negative.    Respiratory:  Positive for shortness of breath.   Cardiovascular: Negative.   Gastrointestinal:  Positive for abdominal pain.  Genitourinary:  Positive for flank pain and frequency.  Musculoskeletal:  Negative for gait problem.  Skin: Negative.   Neurological:  Positive for weakness.  Psychiatric/Behavioral:  Positive for sleep disturbance.    Immunization History  Administered Date(s)  Administered   Influenza, High Dose Seasonal PF 08/04/2019   Influenza,inj,Quad PF,6+ Mos 08/05/2018   Influenza-Unspecified 07/24/2020   Moderna Sars-Covid-2 Vaccination 11/07/2019, 12/05/2019, 09/17/2020   Pneumococcal Conjugate-13 05/19/2017   Pneumococcal Polysaccharide-23 07/19/2012   Pertinent  Health Maintenance Due  Topic Date Due   INFLUENZA VACCINE  06/03/2021   PNA vac Low Risk Adult  Completed   Fall Risk  11/14/2020 10/31/2020 06/05/2020 05/16/2020 05/02/2020  Falls in the past year? 0 0 0 0 0  Number falls in past yr: 0 0 0 0 0   Functional Status Survey:    Vitals:   06/13/21 1456  BP: 130/68  Pulse: 70  Resp: 20  Temp: 98.9 F (37.2 C)  SpO2: 97%  Weight: 105 lb 9.6 oz (47.9 kg)  Height: 5' 5.5" (1.664 m)   Body mass index is 17.31 kg/m. Physical Exam Constitutional: Oriented to person, place, and time. Well-developed  Head: Normocephalic.  Mouth/Throat: Oropharynx is clear and moist.  Eyes: Pupils are equal, round, and reactive to light.  Neck: Neck supple.  Cardiovascular: Normal rate and normal heart sounds.  No murmur heard. Pulmonary/Chest: Effort normal and breath sounds normal. No respiratory distress. No wheezes. She has no rales.  Abdominal: Soft. Bowel sounds are normal. No distension. There is no rebound.  Discomfort in lower abdominal Area Musculoskeletal: No edema.  Lymphadenopathy: none Neurological: Alert and oriented to person, place, and time.  Skin: Skin is warm and dry.  Psychiatric: Normal mood and affect. Behavior is normal. Thought content normal.   Labs reviewed: Recent Labs    06/19/20 1349 11/18/20 1429 03/21/21 1201  NA 140 141 139  K 4.3 4.2 4.1  CL 105 104 102  CO2 '28 30 23  '$ GLUCOSE 120* 108* 93  BUN 26* 24* 19  CREATININE 1.45* 1.74* 1.51*  CALCIUM 8.4* 8.8* 9.5   Recent Labs    11/18/20 1429 03/21/21 1201  AST 23 12  ALT 24 8*  ALKPHOS 71  --   BILITOT 0.7 0.5  PROT 6.9 6.6  ALBUMIN 4.3  --    Recent  Labs    11/18/20 1429 03/21/21 1201  WBC 6.6 8.3  NEUTROABS 4.3 4,822  HGB 13.6 14.4  HCT 41.0 43.3  MCV 93.8 91.5  PLT 193 222   Lab Results  Component Value Date   TSH  1.57 03/21/2021   No results found for: HGBA1C Lab Results  Component Value Date   CHOL 163 03/21/2021   HDL 65 03/21/2021   LDLCALC 79 03/21/2021   TRIG 100 03/21/2021   CHOLHDL 2.5 03/21/2021    Significant Diagnostic Results in last 30 days:  Intravitreal Injection, Pharmacologic Agent - OS - Left Eye  Result Date: 06/11/2021 Time Out 06/11/2021. 1:46 PM. Confirmed correct patient, procedure, site, and patient consented. Anesthesia Topical anesthesia was used. Anesthetic medications included Akten 3.5%. Procedure Preparation included Tobramycin 0.3%, Ofloxacin , 10% betadine to eyelids, 5% betadine to ocular surface. A 30 gauge needle was used. Injection: 2 mg aflibercept 2 MG/0.05ML   Route: Intravitreal, Site: Left Eye   NDC: [id number], Lot: [id number], Waste: 0 mL Post-op Post injection exam found visual acuity of at least counting fingers. The patient tolerated the procedure well. There were no complications. The patient received written and verbal post procedure care education. Post injection medications were not given.   OCT, Retina - OU - Both Eyes  Result Date: 06/11/2021 Right Eye Quality was good. Scan locations included subfoveal. Central Foveal Thickness: 437. Progression has been stable. Findings include epiretinal membrane, cystoid macular edema, subretinal scarring, disciform scar. Left Eye Quality was good. Scan locations included subfoveal. Central Foveal Thickness: 230. Progression has improved. Findings include pigment epithelial detachment, no SRF, no IRF. Notes OD with central CME, persistent with severe epiretinal membrane.  Nonetheless outer retinal scarring precludes any attempted surgery at this point in the right eye for epiretinal membrane removal. Vastly improved improved macular anatomy  with smaller PED and smaller amount of subretinal fluid In the foveal location status post injection Eylea we will repeat injection today with Eylea, currently at 5 weeks 5-day interval looks great as compared to onset of disease March 2022   Assessment/Plan Malignant neoplasm of overlapping sites of bladder (HCC) Continue Oxycontin 15 mg BID Changes Oxycodone to 5 and 10 mg Capsules per his request instead of 15 mg Reordered Also talked to pharmacy to calculate change to MS Contin for better coverage for him and Less [place] Patient has appointment next week with urology I have told him to talk to them about hospice specially if he is not going to go any further treatment.  He is involved with palliative. He is managing his own medications with supervision by speech and nurses  Other issues  Pulmonary emphysema, unspecified emphysema type ([place]) Continues to have issues with SOB on walking long distance  On Albuterol per his Choice   Benign prostatic hyperplasia  On Vesicare and Flomax Stage 3b chronic kidney disease ([place]) Stable Anxiety On Librium Physical deconditioning Able to manage better in AL  Weight loss Continues to be the issue  Family/ staff Communication:   Labs/tests ordered:

## 2021-06-14 MED ORDER — OXYCODONE HCL 10 MG PO TABS: 10.0000 mg | ORAL_TABLET | ORAL | 0 refills | Status: DC | PRN

## 2021-06-19 ENCOUNTER — Non-Acute Institutional Stay (INDEPENDENT_AMBULATORY_CARE_PROVIDER_SITE_OTHER): Payer: Medicare Other | Admitting: Orthopedic Surgery

## 2021-06-19 ENCOUNTER — Encounter: Payer: Self-pay | Admitting: Orthopedic Surgery

## 2021-06-19 DIAGNOSIS — Z0000 Encounter for general adult medical examination without abnormal findings: Secondary | ICD-10-CM | POA: Diagnosis not present

## 2021-06-19 NOTE — Patient Instructions (Signed)
  Mr. Jesse [name] , Thank you for taking time to come for your Medicare Wellness Visit. I appreciate your ongoing commitment to your health goals. Please review the following plan we discussed and let me know if I can assist you in the future.   These are the goals we discussed:  Goals      Cope with Chronic Pain     Timeframe:  Long-Range Goal Priority:  High Start Date:                             Expected End Date:                       Follow Up Date 08/03/2021    - use distraction techniques    Why is this important?   Stress makes chronic pain feel worse.  Feelings like depression, anxiety, stress and anger can make your body more sensitive to pain.  Learning ways to cope with stress or depression may help you find some relief from the pain.     Notes:         This is a list of the screening recommended for you and due dates:  Health Maintenance  Topic Date Due   Tetanus Vaccine  Never done   Zoster (Shingles) Vaccine (1 of 2) Never done   COVID-19 Vaccine (4 - Booster for Moderna series) 12/18/2020   Flu Shot  06/03/2021   Pneumonia vaccines  Completed   HPV Vaccine  Aged Out

## 2021-06-19 NOTE — Progress Notes (Signed)
Subjective:   Jesse [name] is a 87 y.o. male who presents for Medicare Annual/Subsequent preventive examination.  Review of Systems     Cardiac Risk Factors include: advanced age (>90mn, >>57women);sedentary lifestyle;hypertension     Objective:    Today's Vitals   06/19/21 1114  BP: 130/68  Pulse: 70  Resp: 20  Temp: 97.9 F (36.6 C)  SpO2: 96%  Weight: 105 lb 9.6 oz (47.9 kg)  Height: 5' 5.5" (1.664 m)   Body mass index is 17.31 kg/m.  Advanced Directives 06/19/2021 06/13/2021 06/06/2021 05/07/2021 04/25/2021 11/26/2020 11/20/2020  Does Patient Have a Medical Advance Directive? Yes Yes Yes Yes Yes Yes Yes  Type of Advance Directive Healthcare Power of [place] facility DNR (pink MOST or yellow form);Healthcare Power of AHarley-Davidsonof facility DNR (pink MOST or yellow form) Out of facility DNR (pink MOST or yellow form) Out of facility DNR (pink MOST or yellow form) Healthcare Power of [place] will [place] will  Does patient want to make changes to medical advance directive? No - Patient declined No - Patient declined No - Patient declined No - Patient declined No - Patient declined No - Patient declined -  Copy of H[place]in Chart? Yes - validated most recent copy scanned in chart (See row information) No - copy requested - - - No - copy requested -  Would patient like information on creating a medical advance directive? - - - - - - -  Pre-existing out of facility DNR order (yellow form or pink MOST form) - Pink MOST form placed in chart (order not valid for inpatient use);Yellow form placed in chart (order not valid for inpatient use) Pink MOST form placed in chart (order not valid for inpatient use);Yellow form placed in chart (order not valid for inpatient use) Pink MOST form placed in chart (order not valid for inpatient use);Yellow form placed in chart (order not valid for inpatient use) Pink MOST form placed  in chart (order not valid for inpatient use);Yellow form placed in chart (order not valid for inpatient use) - -    Current Medications (verified) Outpatient Encounter Medications as of 06/19/2021  Medication Sig   albuterol (VENTOLIN HFA) 108 (90 Base) MCG/ACT inhaler Inhale 2 puffs into the lungs every 6 (six) hours as needed.   chlordiazePOXIDE (LIBRIUM) 10 MG capsule Take 1 capsule (10 mg total) by mouth daily.   oxyCODONE (OXY IR/ROXICODONE) 5 MG immediate release tablet Take 1 tablet (5 mg total) by mouth every 4 (four) hours as needed for severe pain.   Oxycodone HCl 10 MG TABS Take 1 tablet (10 mg total) by mouth every 4 (four) hours as needed.   solifenacin (VESICARE) 10 MG tablet Take 10 mg by mouth daily. In the morning.   tamsulosin (FLOMAX) 0.4 MG CAPS capsule Take 0.8 mg by mouth daily.   [DISCONTINUED] oxyCODONE (OXYCONTIN) 15 mg 12 hr tablet Take 1 tablet (15 mg total) by mouth every 12 (twelve) hours.   No facility-administered encounter medications on file as of 06/19/2021.    Allergies (verified) Adhesive [tape]   History: Past Medical History:  Diagnosis Date   Anxiety    Asthma    as child   Bladder cancer ([facility] urologist-  [facility] [place]   dx 01/ 2016--- post TURBT and post Bladder bx 02-10-2017   Bladder tumor    BPH (benign prostatic hyperplasia)  Frequency of urination    History of asthma    childhood   History of external beam radiation therapy    2002  prostate/ pelvis and boost w/ radioactive prostate seed implants    History of hypercalcemia    s/p  parathyroidectomy 2003   History of prostate cancer current urologist-  dr Elliot Gault [facility] in [place], [place])-- per lov note (care everywhere) last PSA undetectable   dx 2002--  urologist-- dr dahlstedt/ oncologist dr Valere Dross---  Gleason 7 out of 10, PSA 4.9---post Radioactive Prostate Seed Implant and External Beam Radiation therapy   History of skin  cancer    Hyperlipidemia    Multiple lung nodules    since 2006--- followed by pcp --- dr kim   Pneumonia    Renal cyst, left    Wears glasses    Wears hearing aid in both ears    Past Surgical History:  Procedure Laterality Date   CARDIOVASCULAR STRESS TEST  01/01/2004   normal nuclear perfustion study w/ no ischemia/  normal LV function and wall motion, ef 65%   CATARACT EXTRACTION W/ INTRAOCULAR LENS  IMPLANT, BILATERAL  2010   COLONOSCOPY     CYSTO/  BILATERAL RETROGRADE PYELOGRAM/  BLADDER BX'S AND FULGERATION  02-10-2017   dr Anabel Bene [facility] in [place], [place] Bilateral 07/27/2017   Procedure: CYSTOSCOPY,SELECTIVE CYTOLOGIES,RETROGRADE PYELOGRAM;  Surgeon: Cleon Gustin, MD;  Location: [facility];  Service: Urology;  Laterality: Bilateral;   CYSTOSCOPY W/ RETROGRADES Bilateral 12/08/2017   Procedure: CYSTOSCOPY WITH RETROGRADE PYELOGRAM;  Surgeon: Cleon Gustin, MD;  Location[facility];  Service: Urology;  Laterality: Bilateral;   CYSTOSCOPY WITH BIOPSY N/A 07/27/2017   Procedure: CYSTOSCOPY WITH BIOPSY OF BLADDER AND PROSTATIC URETHRA;  Surgeon: Cleon Gustin, MD;  Location[facility];  Service: Urology;  Laterality: N/A;   CYSTOSCOPY WITH BIOPSY Bilateral 12/08/2017   Procedure: CYSTOSCOPY WITH RENAL WASHINGS;  Surgeon: Cleon Gustin, MD;  Location[facility];  Service: Urology;  Laterality: Bilateral;   CYSTOSCOPY WITH RETROGRADE PYELOGRAM, URETEROSCOPY AND STENT PLACEMENT Left 11/26/2020   Procedure: CYSTOSCOPY WITH LEFT RETROGRADE PYELOGRAM, URETEROSCOPY, BRUSH BIOPSY, TURBT;  Surgeon: Franchot Gallo, MD;  Location: [facility];  Service: Urology;  Laterality: Left;   FIBEROPTIC BRONCHOSCOPY  08-20-2005   dr wert   w/ Left lower lobe bx   PARATHYROIDECTOMY  2003   RADIOACTIVE PROSTATE SEED IMPLANTS  04-05-2001    dr Diona Fanti [place]  child    TRANSURETHRAL RESECTION OF BLADDER TUMOR  11-24-2014   dr Anabel Bene [facility] in [place], [place] OF BLADDER TUMOR N/A 04/08/2018   Procedure: TRANSURETHRAL RESECTION OF BLADDER TUMOR (TURBT);  Surgeon: Cleon Gustin, MD;  Location: [facility];  Service: Urology;  Laterality: N/A;   TRANSURETHRAL RESECTION OF PROSTATE N/A 02/28/2019   Procedure: TRANSURETHRAL RESECTION OF THE PROSTATE (TURP);  Surgeon: Cleon Gustin, MD;  Location: [facility];  Service: Urology;  Laterality: N/A;  30 MINS   UPPER GI ENDOSCOPY     No family history on file. Social History   Socioeconomic History   Marital status: Married    Spouse name: Not on file   Number of children: Not on file   Years of education: Not on file   Highest education level: Not on file  Occupational History   Not on file  Tobacco Use  Smoking status: Former    Years: 50.00    Types: Cigarettes    Quit date: 11/03/1984    Years since quitting: 36.6   Smokeless tobacco: Never  Vaping Use   Vaping Use: Never used  Substance and Sexual Activity   Alcohol use: Yes    Alcohol/week: 7.0 standard drinks    Types: 7 Glasses of wine per week    Comment: daily wine   Drug use: No   Sexual activity: Not Currently  Other Topics Concern   Not on file  Social History Narrative   Not on file   Social Determinants of Health   Financial Resource Strain: Low Risk    Difficulty of Paying Living Expenses: Not hard at all  Food Insecurity: No Food Insecurity   Worried About Charity fundraiser in the Last Year: Never true   Ran Out of Food in the Last Year: Never true  Transportation Needs: No Transportation Needs   Lack of Transportation (Medical): No   Lack of Transportation (Non-Medical): No  Physical Activity: Inactive   Days of Exercise per Week: 0 days   Minutes of Exercise per Session: 0 min  Stress: No Stress Concern Present   Feeling of Stress : Not at all   Social Connections: Moderately Isolated   Frequency of Communication with Friends and Family: Three times a week   Frequency of Social Gatherings with Friends and Family: Three times a week   Attends Religious Services: Never   Active Member of Clubs or Organizations: No   Attends Music therapist: Never   Marital Status: Married    Tobacco Counseling Counseling given: Not Answered   Clinical Intake:  Pre-visit preparation completed: Yes  Pain : No/denies pain     BMI - recorded: 17.31 Nutritional Status: BMI <19  Underweight Nutritional Risks: None Diabetes: No  How often do you need to have someone help you when you read instructions, pamphlets, or other written materials from your doctor or pharmacy?: 3 - Sometimes What is the last grade level you completed in school?: 5 years college  Diabetic?No  Interpreter Needed?: No      Activities of Daily Living In your present state of health, do you have any difficulty performing the following activities: 06/19/2021 11/26/2020  Hearing? Tempie Donning  Vision? N N  Difficulty concentrating or making decisions? N N  Walking or climbing stairs? Y Y  Dressing or bathing? N N  Doing errands, shopping? Y N  Preparing Food and eating ? Y -  Using the Toilet? N -  In the past six months, have you accidently leaked urine? N -  Do you have problems with loss of bowel control? N -  Managing your Medications? Y -  Managing your Finances? N -  Housekeeping or managing your Housekeeping? N -  Some recent data might be hidden    Patient Care Team: Virgie Dad, MD as PCP - General (Internal Medicine) Ezekiel Slocumb, NP as Nurse Practitioner [facility] and Palliative Medicine)  Indicate any recent Medical Services you may have received from other than [facility] providers in the past year (date may be approximate).     Assessment:   This is a routine wellness examination for [place].  Hearing/Vision screen No results  found.  Dietary issues and exercise activities discussed: Current Exercise Habits: The patient does not participate in regular exercise at present, Exercise limited by: cardiac condition(s);neurologic condition(s) (COPD)   Goals Addressed  This Visit's Progress    Cope with Chronic Pain       Timeframe:  Long-Range Goal Priority:  High Start Date:                             Expected End Date:                       Follow Up Date 08/03/2021    - use distraction techniques    Why is this important?   Stress makes chronic pain feel worse.  Feelings like depression, anxiety, stress and anger can make your body more sensitive to pain.  Learning ways to cope with stress or depression may help you find some relief from the pain.     Notes:        Depression Screen PHQ 2/9 Scores 06/19/2021 01/18/2020  PHQ - 2 Score 0 2  PHQ- 9 Score - 8    Fall Risk Fall Risk  06/19/2021 11/14/2020 10/31/2020 06/05/2020 05/16/2020  Falls in the past year? 0 0 0 0 0  Number falls in past yr: 0 0 0 0 0  Injury with Fall? 0 - - - -  Risk for fall due to : History of fall(s);Other (Comment) - - - -  Risk for fall due to: Comment opioid use - - - -  Follow up Falls evaluation completed;Education provided;Falls prevention discussed - - - -    FALL RISK PREVENTION PERTAINING TO THE HOME:  Any stairs in or around the home? No  If so, are there any without handrails? No  Home free of loose throw rugs in walkways, pet beds, electrical cords, etc? Yes  Adequate lighting in your home to reduce risk of falls? Yes   ASSISTIVE DEVICES UTILIZED TO PREVENT FALLS:  Life alert? No  Use of a cane, walker or w/c? Yes  Grab bars in the bathroom? Yes  Shower chair or bench in shower? Yes  Elevated toilet seat or a handicapped toilet? Yes   TIMED UP AND GO:  Was the test performed? No .  Length of time to ambulate 10 feet: UTA sec.   Gait slow and steady with assistive device  Cognitive  Function:        Immunizations Immunization History  Administered Date(s) Administered   Influenza, High Dose Seasonal PF 08/04/2019   Influenza,inj,Quad PF,6+ Mos 08/05/2018   Influenza-Unspecified 07/24/2020   Moderna Sars-Covid-2 Vaccination 11/07/2019, 12/05/2019, 09/17/2020   Pneumococcal Conjugate-13 05/19/2017   Pneumococcal Polysaccharide-23 07/19/2012    TDAP status: Due, Education has been provided regarding the importance of this vaccine. Advised may receive this vaccine at local pharmacy or Health Dept. Aware to provide a copy of the vaccination record if obtained from local pharmacy or Health Dept. Verbalized acceptance and understanding.  Flu Vaccine status: Due, Education has been provided regarding the importance of this vaccine. Advised may receive this vaccine at local pharmacy or Health Dept. Aware to provide a copy of the vaccination record if obtained from local pharmacy or Health Dept. Verbalized acceptance and understanding.  Pneumococcal vaccine status: Up to date  Covid-19 vaccine status: Completed vaccines  Qualifies for Shingles Vaccine? Yes   Zostavax completed No   Shingrix Completed?: No.    Education has been provided regarding the importance of this vaccine. Patient has been advised to call insurance company to determine out of pocket expense if they have not yet received this vaccine.  Advised may also receive vaccine at local pharmacy or Health Dept. Verbalized acceptance and understanding.  Screening Tests Health Maintenance  Topic Date Due   TETANUS/TDAP  Never done   Zoster Vaccines- Shingrix (1 of 2) Never done   COVID-19 Vaccine (4 - Booster for Moderna series) 12/18/2020   INFLUENZA VACCINE  06/03/2021   PNA vac Low Risk Adult  Completed   HPV VACCINES  Aged Out    Health Maintenance  Health Maintenance Due  Topic Date Due   TETANUS/TDAP  Never done   Zoster Vaccines- Shingrix (1 of 2) Never done   COVID-19 Vaccine (4 - Booster for  Moderna series) 12/18/2020   INFLUENZA VACCINE  06/03/2021    Colorectal cancer screening: No longer required.   Lung Cancer Screening: (Low Dose CT Chest recommended if Age 48-80 years, 30 pack-year currently smoking OR have quit w/in 15years.) does not qualify.   Lung Cancer Screening Referral: No  Additional Screening:  Hepatitis C Screening: does not qualify; not complete- advanced age  Vision Screening: Recommended annual ophthalmology exams for early detection of glaucoma and other disorders of the eye. Is the patient up to date with their annual eye exam?  Yes  Who is the provider or what is the name of the office in which the patient attends annual eye exams? N/A If pt is not established with a provider, would they like to be referred to a provider to establish care? No .   Dental Screening: Recommended annual dental exams for proper oral hygiene  Community Resource Referral / Chronic Care Management: CRR required this visit?  No   CCM required this visit?  No      Plan:     I have personally reviewed and noted the following in the patient's chart:   Medical and social history Use of alcohol, tobacco or illicit drugs  Current medications and supplements including opioid prescriptions. Patient is currently taking opioid prescriptions. Information provided to patient regarding non-opioid alternatives. Patient advised to discuss non-opioid treatment plan with their provider. Functional ability and status Nutritional status Physical activity Advanced directives List of other physicians Hospitalizations, surgeries, and ER visits in previous 12 months Vitals Screenings to include cognitive, depression, and falls Referrals and appointments  In addition, I have reviewed and discussed with patient certain preventive protocols, quality metrics, and best practice recommendations. A written personalized care plan for preventive services as well as general preventive health  recommendations were provided to patient.     Yvonna Alanis, NP   06/19/2021

## 2021-06-19 NOTE — Progress Notes (Signed)
Provider:  Windell Moulding, NP Location:  [place], [place]   Place of Service:      PCP: Virgie Dad, MD Patient Care Team: Virgie Dad, MD as PCP - General (Internal Medicine) Ezekiel Slocumb, NP as Nurse Practitioner [facility] and Palliative Medicine)  Extended Emergency Contact Information Primary Emergency Contact: Mellissa Kohut of [place] Phone: [phone] Mobile Phone: [phone] Relation: Son Secondary Emergency Contact: Cranford,Ann Address: [address] [place]          [place], [place] [address] Johnnette Litter of [place] Phone: [phone] Relation: Spouse  Code Status: DNR Goals of Care: Advanced Directive information Advanced Directives 06/19/2021  Does Patient Have a Medical Advance Directive? Yes  Type of Advance Directive [place]  Does patient want to make changes to medical advance directive? No - Patient declined  Copy of [place] in Chart? Yes - validated most recent copy scanned in chart (See row information)  Would patient like information on creating a medical advance directive? -  Pre-existing out of facility DNR order (yellow form or pink MOST form) -     Chief Complaint  Patient presents with   Medicare Wellness    Annual wellness    HPI: Patient is a 87 y.o. male seen today for an annual comprehensive examination.  Past Medical History:  Diagnosis Date   Anxiety    Asthma    as child   Bladder cancer [facility]) urologist-  dr Elliot Gault [facility] in [place], [place])   dx 01/ 2016--- post TURBT and post Bladder bx 02-10-2017   Bladder tumor    BPH (benign prostatic hyperplasia)    Frequency of urination    History of asthma    childhood   History of external beam radiation therapy    2002  prostate/ pelvis and boost w/ radioactive prostate seed implants    History of hypercalcemia    s/p  parathyroidectomy 2003   History of prostate cancer  current urologist-  dr Elliot Gault [facility] in [place], [place])-- per lov note (care everywhere) last PSA undetectable   dx 2002--  urologist-- dr dahlstedt/ oncologist dr Valere Dross---  Gleason 7 out of 10, PSA 4.9---post Radioactive Prostate Seed Implant and External Beam Radiation therapy   History of skin cancer    Hyperlipidemia    Multiple lung nodules    since 2006--- followed by pcp --- dr kim   Pneumonia    Renal cyst, left    Wears glasses    Wears hearing aid in both ears    Past Surgical History:  Procedure Laterality Date   CARDIOVASCULAR STRESS TEST  01/01/2004   normal nuclear perfustion study w/ no ischemia/  normal LV function and wall motion, ef 65%   CATARACT EXTRACTION W/ INTRAOCULAR LENS  IMPLANT, BILATERAL  2010   COLONOSCOPY     CYSTO/  BILATERAL RETROGRADE PYELOGRAM/  BLADDER BX'S AND FULGERATION  02-10-2017   dr Anabel Bene [facility] in [place], [place] Bilateral 07/27/2017   Procedure: CYSTOSCOPY,SELECTIVE CYTOLOGIES,RETROGRADE PYELOGRAM;  Surgeon: Cleon Gustin, MD;  Location: [facility];  Service: Urology;  Laterality: Bilateral;   CYSTOSCOPY W/ RETROGRADES Bilateral 12/08/2017   Procedure: CYSTOSCOPY WITH RETROGRADE PYELOGRAM;  Surgeon: Cleon Gustin, MD;  Location: [facility];  Service: Urology;  Laterality: Bilateral;   CYSTOSCOPY WITH BIOPSY N/A 07/27/2017   Procedure: CYSTOSCOPY WITH BIOPSY OF BLADDER AND PROSTATIC  URETHRA;  Surgeon: Cleon Gustin, MD;  Location: [facility];  Service: Urology;  Laterality: N/A;   CYSTOSCOPY WITH BIOPSY Bilateral 12/08/2017   Procedure: CYSTOSCOPY WITH RENAL WASHINGS;  Surgeon: Cleon Gustin, MD;  Location: [facility];  Service: Urology;  Laterality: Bilateral;   CYSTOSCOPY WITH RETROGRADE PYELOGRAM, URETEROSCOPY AND STENT PLACEMENT Left 11/26/2020   Procedure: CYSTOSCOPY WITH LEFT RETROGRADE PYELOGRAM, URETEROSCOPY,  BRUSH BIOPSY, TURBT;  Surgeon: Franchot Gallo, MD;  Location: [facility];  Service: Urology;  Laterality: Left;   FIBEROPTIC BRONCHOSCOPY  08-20-2005   dr wert   w/ Left lower lobe bx   PARATHYROIDECTOMY  2003   RADIOACTIVE PROSTATE SEED IMPLANTS  04-05-2001    dr Diona Fanti [place]  child   TRANSURETHRAL RESECTION OF BLADDER TUMOR  11-24-2014   dr Anabel Bene [facility] in [place], [place] OF BLADDER TUMOR N/A 04/08/2018   Procedure: TRANSURETHRAL RESECTION OF BLADDER TUMOR (TURBT);  Surgeon: Cleon Gustin, MD;  Location: [facility];  Service: Urology;  Laterality: N/A;   TRANSURETHRAL RESECTION OF PROSTATE N/A 02/28/2019   Procedure: TRANSURETHRAL RESECTION OF THE PROSTATE (TURP);  Surgeon: Cleon Gustin, MD;  Location: [facility];  Service: Urology;  Laterality: N/A;  30 MINS   UPPER GI ENDOSCOPY      reports that he quit smoking about 36 years ago. His smoking use included cigarettes. He has never used smokeless tobacco. He reports current alcohol use of about 7.0 standard drinks per week. He reports that he does not use drugs. Social History   Socioeconomic History   Marital status: Married    Spouse name: Not on file   Number of children: Not on file   Years of education: Not on file   Highest education level: Not on file  Occupational History   Not on file  Tobacco Use   Smoking status: Former    Years: 50.00    Types: Cigarettes    Quit date: 11/03/1984    Years since quitting: 36.6   Smokeless tobacco: Never  Vaping Use   Vaping Use: Never used  Substance and Sexual Activity   Alcohol use: Yes    Alcohol/week: 7.0 standard drinks    Types: 7 Glasses of wine per week    Comment: daily wine   Drug use: No   Sexual activity: Not Currently  Other Topics Concern   Not on file  Social History Narrative   Not on file   Social Determinants of Health   Financial Resource Strain: Not on file   Food Insecurity: Not on file  Transportation Needs: Not on file  Physical Activity: Not on file  Stress: Not on file  Social Connections: Not on file  Intimate Partner Violence: Not on file   No family history on file.  Pertinent  Health Maintenance Due  Topic Date Due   INFLUENZA VACCINE  06/03/2021   PNA vac Low Risk Adult  Completed   Fall Risk  11/14/2020 10/31/2020 06/05/2020 05/16/2020 05/02/2020  Falls in the past year? 0 0 0 0 0  Number falls in past yr: 0 0 0 0 0   Depression screen PHQ 2/9 01/18/2020  Decreased Interest 2  Down, Depressed, Hopeless 0  PHQ - 2 Score 2  Altered sleeping 0  Tired, decreased energy 3  Change in appetite 3  Feeling bad or failure about yourself  0  Trouble concentrating 0  Moving slowly  or fidgety/restless 0  Suicidal thoughts 0  PHQ-9 Score 8  Difficult doing work/chores Not difficult at all    Functional Status Survey:    Allergies  Allergen Reactions   Adhesive [Tape] Other (See Comments)    Tears skin off PAPER TAPE IS OKAY    Allergies as of 06/19/2021       Reactions   Adhesive [tape] Other (See Comments)   Tears skin off PAPER TAPE IS OKAY        Medication List        Accurate as of June 19, 2021 11:17 AM. If you have any questions, ask your nurse or doctor.          albuterol 108 (90 Base) MCG/ACT inhaler Commonly known as: VENTOLIN HFA Inhale 2 puffs into the lungs every 6 (six) hours as needed.   chlordiazePOXIDE 10 MG capsule Commonly known as: LIBRIUM Take 1 capsule (10 mg total) by mouth daily.   oxyCODONE 5 MG immediate release tablet Commonly known as: Oxy IR/ROXICODONE Take 1 tablet (5 mg total) by mouth every 4 (four) hours as needed for severe pain. What changed: Another medication with the same name was removed. Continue taking this medication, and follow the directions you see here. Changed by: Yvonna Alanis, NP   Oxycodone HCl 10 MG Tabs Take 1 tablet (10 mg total) by mouth every 4  (four) hours as needed. What changed: Another medication with the same name was removed. Continue taking this medication, and follow the directions you see here. Changed by: Yvonna Alanis, NP   solifenacin 10 MG tablet Commonly known as: VESICARE Take 10 mg by mouth daily. In the morning.   tamsulosin 0.4 MG Caps capsule Commonly known as: FLOMAX Take 0.8 mg by mouth daily.        Review of Systems  Vitals:   06/19/21 1114  BP: 130/68  Pulse: 70  Resp: 20  Temp: 97.9 F (36.6 C)  SpO2: 96%  Weight: 105 lb 9.6 oz (47.9 kg)  Height: 5' 5.5" (1.664 m)   Body mass index is 17.31 kg/m. Physical Exam  Labs reviewed: Basic Metabolic Panel: Recent Labs    06/19/20 1349 11/18/20 1429 03/21/21 1201  NA 140 141 139  K 4.3 4.2 4.1  CL 105 104 102  CO2 '28 30 23  '$ GLUCOSE 120* 108* 93  BUN 26* 24* 19  CREATININE 1.45* 1.74* 1.51*  CALCIUM 8.4* 8.8* 9.5   Liver Function Tests: Recent Labs    11/18/20 1429 03/21/21 1201  AST 23 12  ALT 24 8*  ALKPHOS 71  --   BILITOT 0.7 0.5  PROT 6.9 6.6  ALBUMIN 4.3  --    No results for input(s): LIPASE, AMYLASE in the last 8760 hours. No results for input(s): AMMONIA in the last 8760 hours. CBC: Recent Labs    11/18/20 1429 03/21/21 1201  WBC 6.6 8.3  NEUTROABS 4.3 4,822  HGB 13.6 14.4  HCT 41.0 43.3  MCV 93.8 91.5  PLT 193 222   Cardiac Enzymes: No results for input(s): CKTOTAL, CKMB, CKMBINDEX, TROPONINI in the last 8760 hours. BNP: Invalid input(s): POCBNP No results found for: HGBA1C Lab Results  Component Value Date   TSH 1.57 03/21/2021   Lab Results  Component Value Date   [id number] 01/12/2020   No results found for: FOLATE Lab Results  Component Value Date   FERRITIN 316 11/25/2019    Imaging and Procedures obtained recently: Intravitreal Injection, Pharmacologic Agent -  OS - Left Eye  Result Date: 06/11/2021 Time Out 06/11/2021. 1:46 PM. Confirmed correct patient, procedure, site, and  patient consented. Anesthesia Topical anesthesia was used. Anesthetic medications included Akten 3.5%. Procedure Preparation included Tobramycin 0.3%, Ofloxacin , 10% betadine to eyelids, 5% betadine to ocular surface. A 30 gauge needle was used. Injection: 2 mg aflibercept 2 MG/0.05ML   Route: Intravitreal, Site: Left Eye   NDC: [id number], Lot: [id number], Waste: 0 mL Post-op Post injection exam found visual acuity of at least counting fingers. The patient tolerated the procedure well. There were no complications. The patient received written and verbal post procedure care education. Post injection medications were not given.   OCT, Retina - OU - Both Eyes  Result Date: 06/11/2021 Right Eye Quality was good. Scan locations included subfoveal. Central Foveal Thickness: 437. Progression has been stable. Findings include epiretinal membrane, cystoid macular edema, subretinal scarring, disciform scar. Left Eye Quality was good. Scan locations included subfoveal. Central Foveal Thickness: 230. Progression has improved. Findings include pigment epithelial detachment, no SRF, no IRF. Notes OD with central CME, persistent with severe epiretinal membrane.  Nonetheless outer retinal scarring precludes any attempted surgery at this point in the right eye for epiretinal membrane removal. Vastly improved improved macular anatomy with smaller PED and smaller amount of subretinal fluid In the foveal location status post injection Eylea we will repeat injection today with Eylea, currently at 5 weeks 5-day interval looks great as compared to onset of disease March 2022   Assessment/Plan There are no diagnoses linked to this encounter.   Family/ staff Communication:   Labs/tests ordered:

## 2021-07-04 ENCOUNTER — Non-Acute Institutional Stay: Payer: Medicare Other | Admitting: Internal Medicine

## 2021-07-04 DIAGNOSIS — N1832 Chronic kidney disease, stage 3b: Secondary | ICD-10-CM | POA: Diagnosis not present

## 2021-07-04 DIAGNOSIS — C678 Malignant neoplasm of overlapping sites of bladder: Secondary | ICD-10-CM | POA: Diagnosis not present

## 2021-07-04 DIAGNOSIS — F419 Anxiety disorder, unspecified: Secondary | ICD-10-CM

## 2021-07-04 DIAGNOSIS — J439 Emphysema, unspecified: Secondary | ICD-10-CM | POA: Diagnosis not present

## 2021-07-04 DIAGNOSIS — N400 Enlarged prostate without lower urinary tract symptoms: Secondary | ICD-10-CM

## 2021-07-05 ENCOUNTER — Encounter: Payer: Self-pay | Admitting: Internal Medicine

## 2021-07-05 MED ORDER — HYDROMORPHONE HCL 2 MG PO TABS: 4.0000 mg | ORAL_TABLET | ORAL | 0 refills | Status: DC | PRN

## 2021-07-05 NOTE — Progress Notes (Signed)
Location:   [place] Room Number: 36 Place of Service:  ALF [phone]) Provider:  Veleta Miners MD  Virgie Dad, MD  Patient Care Team: Virgie Dad, MD as PCP - General (Internal Medicine) Ezekiel Slocumb, NP as Nurse Practitioner [facility] and Palliative Medicine)  Extended Emergency Contact Information Primary Emergency Contact: Mellissa Kohut of [place] Phone: [phone] Mobile Phone: [phone] Relation: Son Secondary Emergency Contact: Cranford,Ann Address: [address] [place]          [place], [place] [address] Johnnette Litter of [place] Phone: [phone] Relation: Spouse  Code Status:  Hospice Goals of care: Advanced Directive information Advanced Directives 07/05/2021  Does Patient Have a Medical Advance Directive? Yes  Type of Advance Directive [place]  Does patient want to make changes to medical advance directive? No - Patient declined  Copy of [place] in Chart? Yes - validated most recent copy scanned in chart (See row information)  Would patient like information on creating a medical advance directive? -  Pre-existing out of facility DNR order (yellow form or pink MOST form) -     Chief Complaint  Patient presents with   Acute Visit    Pain     HPI:  Pt is a 87 y.o. male seen today for an acute visit for Severe Pain  Patient has PMH  Covid Pneumonia BPH and h/o Bladder cancer, HLD and Depression and COPD   Was having Hematuria CT scan  showed Multifocal Urothelial Cancer Had Cystoscopy on 01/24. Since then he has been Diagnosed with Obstructive Urothelial Cancer of Left Urethra with Muscle invasive cancer of Bladder  Now Enrolled in Hospice after Urology said he is not Candidate for any Surgical Intervention Patient was on high-dose of oxycodone.  Which was switched to hydromorphone.  The hospice  started him on methadone 5 mg twice daily and told  him not to take his as needed Dilaudid .  Is now in severe pain.  Has been on methadone for more than a week.  Continues to have poor appetite.  Was feeling very weak today.  Also having hematuria.   Past Medical History:  Diagnosis Date   Anxiety    Asthma    as child   Bladder cancer [facility]) urologist-  dr Elliot Gault [facility] in [place], [place])   dx 01/ 2016--- post TURBT and post Bladder bx 02-10-2017   Bladder tumor    BPH (benign prostatic hyperplasia)    Frequency of urination    History of asthma    childhood   History of external beam radiation therapy    2002  prostate/ pelvis and boost w/ radioactive prostate seed implants    History of hypercalcemia    s/p  parathyroidectomy 2003   History of prostate cancer current urologist-  dr Elliot Gault [facility] in [place], [place])-- per lov note (care everywhere) last PSA undetectable   dx 2002--  urologist-- dr dahlstedt/ oncologist dr Valere Dross---  Gleason 7 out of 10, PSA 4.9---post Radioactive Prostate Seed Implant and External Beam Radiation therapy   History of skin cancer    Hyperlipidemia    Multiple lung nodules    since 2006--- followed by pcp --- dr kim   Pneumonia    Renal cyst, left    Wears glasses    Wears hearing aid in both ears    Past Surgical History:  Procedure Laterality Date  CARDIOVASCULAR STRESS TEST  01/01/2004   normal nuclear perfustion study w/ no ischemia/  normal LV function and wall motion, ef 65%   CATARACT EXTRACTION W/ INTRAOCULAR LENS  IMPLANT, BILATERAL  2010   COLONOSCOPY     CYSTO/  BILATERAL RETROGRADE PYELOGRAM/  BLADDER BX'S AND FULGERATION  02-10-2017   dr Anabel Bene [facility] in [place], [place] W/ RETROGRADES Bilateral 07/27/2017   Procedure: CYSTOSCOPY,SELECTIVE CYTOLOGIES,RETROGRADE PYELOGRAM;  Surgeon: Cleon Gustin, MD;  Location: [facility];  Service: Urology;  Laterality: Bilateral;   CYSTOSCOPY W/ RETROGRADES Bilateral 12/08/2017    Procedure: CYSTOSCOPY WITH RETROGRADE PYELOGRAM;  Surgeon: Cleon Gustin, MD;  Location: [facility];  Service: Urology;  Laterality: Bilateral;   CYSTOSCOPY WITH BIOPSY N/A 07/27/2017   Procedure: CYSTOSCOPY WITH BIOPSY OF BLADDER AND PROSTATIC URETHRA;  Surgeon: Cleon Gustin, MD;  Location: [facility];  Service: Urology;  Laterality: N/A;   CYSTOSCOPY WITH BIOPSY Bilateral 12/08/2017   Procedure: CYSTOSCOPY WITH RENAL WASHINGS;  Surgeon: Cleon Gustin, MD;  Location: [facility];  Service: Urology;  Laterality: Bilateral;   CYSTOSCOPY WITH RETROGRADE PYELOGRAM, URETEROSCOPY AND STENT PLACEMENT Left 11/26/2020   Procedure: CYSTOSCOPY WITH LEFT RETROGRADE PYELOGRAM, URETEROSCOPY, BRUSH BIOPSY, TURBT;  Surgeon: Franchot Gallo, MD;  Location: [facility];  Service: Urology;  Laterality: Left;   FIBEROPTIC BRONCHOSCOPY  08-20-2005   dr wert   w/ Left lower lobe bx   PARATHYROIDECTOMY  2003   RADIOACTIVE PROSTATE SEED IMPLANTS  04-05-2001    dr Diona Fanti [place]  child   TRANSURETHRAL RESECTION OF BLADDER TUMOR  11-24-2014   dr Anabel Bene [facility] in [place], [place] OF BLADDER TUMOR N/A 04/08/2018   Procedure: TRANSURETHRAL RESECTION OF BLADDER TUMOR (TURBT);  Surgeon: Cleon Gustin, MD;  Location: [facility];  Service: Urology;  Laterality: N/A;   TRANSURETHRAL RESECTION OF PROSTATE N/A 02/28/2019   Procedure: TRANSURETHRAL RESECTION OF THE PROSTATE (TURP);  Surgeon: Cleon Gustin, MD;  Location: [facility];  Service: Urology;  Laterality: N/A;  30 MINS   UPPER GI ENDOSCOPY      Allergies  Allergen Reactions   Adhesive [Tape] Other (See Comments)    Tears skin off PAPER TAPE IS OKAY    Allergies as of 07/04/2021       Reactions   Adhesive [tape] Other (See Comments)   Tears skin off PAPER TAPE IS OKAY        Medication List         Accurate as of July 04, 2021 11:59 PM. If you have any questions, ask your nurse or doctor.          STOP taking these medications    oxyCODONE 5 MG immediate release tablet Commonly known as: Oxy IR/ROXICODONE   Oxycodone HCl 10 MG Tabs       TAKE these medications    albuterol 108 (90 Base) MCG/ACT inhaler Commonly known as: VENTOLIN HFA Inhale 2 puffs into the lungs every 6 (six) hours as needed.   chlordiazePOXIDE 10 MG capsule Commonly known as: LIBRIUM Take 1 capsule (10 mg total) by mouth daily.   HYDROmorphone 2 MG tablet Commonly known as: DILAUDID Take by mouth every 4 (four) hours as needed for severe pain.   HYDROmorphone 2 MG tablet Commonly known as: DILAUDID Take 4 mg by mouth every 4 (four) hours as needed for severe pain.   methadone  5 MG tablet Commonly known as: DOLOPHINE Take 5 mg by mouth 2 (two) times daily.   solifenacin 10 MG tablet Commonly known as: VESICARE Take 10 mg by mouth daily. In the morning.   tamsulosin 0.4 MG Caps capsule Commonly known as: FLOMAX Take 0.8 mg by mouth daily.        Review of Systems  Constitutional:  Positive for activity change and appetite change.  HENT: Negative.    Respiratory: Negative.    Cardiovascular: Negative.   Gastrointestinal:  Positive for abdominal pain.  Genitourinary:  Positive for hematuria.  Musculoskeletal:  Positive for gait problem.  Skin: Negative.   Neurological:  Positive for weakness and light-headedness.  Psychiatric/Behavioral:  Positive for dysphoric mood.    Immunization History  Administered Date(s) Administered   Influenza, High Dose Seasonal PF 08/04/2019   Influenza,inj,Quad PF,6+ Mos 08/05/2018   Influenza-Unspecified 07/24/2020   Moderna Sars-Covid-2 Vaccination 11/07/2019, 12/05/2019, 09/17/2020   Pneumococcal Conjugate-13 05/19/2017   Pneumococcal Polysaccharide-23 07/19/2012   Pertinent  Health Maintenance Due  Topic Date Due   INFLUENZA VACCINE   06/03/2021   PNA vac Low Risk Adult  Completed   Fall Risk  06/19/2021 11/14/2020 10/31/2020 06/05/2020 05/16/2020  Falls in the past year? 0 0 0 0 0  Number falls in past yr: 0 0 0 0 0  Injury with Fall? 0 - - - -  Risk for fall due to : History of fall(s);Other (Comment) - - - -  Risk for fall due to: Comment opioid use - - - -  Follow up Falls evaluation completed;Education provided;Falls prevention discussed - - - -   Functional Status Survey:    Vitals:   07/05/21 1007  BP: 112/64  Pulse: 78  Resp: 12  Temp: 98.1 F (36.7 C)  SpO2: 96%  Weight: 105 lb 9.6 oz (47.9 kg)  Height: 5' 5.5" (1.664 m)   Body mass index is 17.31 kg/m. Physical Exam Constitutional:      Comments: Very frail  HENT:     Head: Normocephalic.     Nose: Nose normal.     Mouth/Throat:     Mouth: Mucous membranes are moist.     Pharynx: Oropharynx is clear.  Cardiovascular:     Rate and Rhythm: Normal rate and regular rhythm.     Pulses: Normal pulses.     Heart sounds: Normal heart sounds.  Pulmonary:     Effort: Pulmonary effort is normal.     Breath sounds: Normal breath sounds.  Abdominal:     General: Abdomen is flat. Bowel sounds are normal.     Palpations: Abdomen is soft.     Comments: Tender in Lower abdomen region  Musculoskeletal:        General: No swelling.     Cervical back: Neck supple.  Neurological:     General: No focal deficit present.     Mental Status: He is alert and oriented to person, place, and time.  Psychiatric:        Mood and Affect: Mood normal.        Thought Content: Thought content normal.        Judgment: Judgment normal.    Labs reviewed: Recent Labs    11/18/20 1429 03/21/21 1201  NA 141 139  K 4.2 4.1  CL 104 102  CO2 30 23  GLUCOSE 108* 93  BUN 24* 19  CREATININE 1.74* 1.51*  CALCIUM 8.8* 9.5   Recent Labs    11/18/20 1429  03/21/21 1201  AST 23 12  ALT 24 8*  ALKPHOS 71  --   BILITOT 0.7 0.5  PROT 6.9 6.6  ALBUMIN 4.3  --     Recent Labs    11/18/20 1429 03/21/21 1201  WBC 6.6 8.3  NEUTROABS 4.3 4,822  HGB 13.6 14.4  HCT 41.0 43.3  MCV 93.8 91.5  PLT 193 222   Lab Results  Component Value Date   TSH 1.57 03/21/2021   No results found for: HGBA1C Lab Results  Component Value Date   CHOL 163 03/21/2021   HDL 65 03/21/2021   LDLCALC 79 03/21/2021   TRIG 100 03/21/2021   CHOLHDL 2.5 03/21/2021    Significant Diagnostic Results in last 30 days:  Intravitreal Injection, Pharmacologic Agent - OS - Left Eye  Result Date: 06/11/2021 Time Out 06/11/2021. 1:46 PM. Confirmed correct patient, procedure, site, and patient consented. Anesthesia Topical anesthesia was used. Anesthetic medications included Akten 3.5%. Procedure Preparation included Tobramycin 0.3%, Ofloxacin , 10% betadine to eyelids, 5% betadine to ocular surface. A 30 gauge needle was used. Injection: 2 mg aflibercept 2 MG/0.05ML   Route: Intravitreal, Site: Left Eye   NDC: [id number], Lot: [id number], Waste: 0 mL Post-op Post injection exam found visual acuity of at least counting fingers. The patient tolerated the procedure well. There were no complications. The patient received written and verbal post procedure care education. Post injection medications were not given.   OCT, Retina - OU - Both Eyes  Result Date: 06/11/2021 Right Eye Quality was good. Scan locations included subfoveal. Central Foveal Thickness: 437. Progression has been stable. Findings include epiretinal membrane, cystoid macular edema, subretinal scarring, disciform scar. Left Eye Quality was good. Scan locations included subfoveal. Central Foveal Thickness: 230. Progression has improved. Findings include pigment epithelial detachment, no SRF, no IRF. Notes OD with central CME, persistent with severe epiretinal membrane.  Nonetheless outer retinal scarring precludes any attempted surgery at this point in the right eye for epiretinal membrane removal. Vastly improved improved  macular anatomy with smaller PED and smaller amount of subretinal fluid In the foveal location status post injection Eylea we will repeat injection today with Eylea, currently at 5 weeks 5-day interval looks great as compared to onset of disease March 2022   Assessment/Plan Malignant neoplasm of overlapping sites of bladder [facility] I discussed his case with hospice.   He in severe pain since he has been off the hydromorphone. Hospice suggested to go up on his methadone to 7.5 and continue the Dilaudid as needed.  Patient does not want to increase the methadone at this time but do want to restart her is Dilaudid because he thinks that really helps him with the pain.  During my exam patient was very weak and we discussed about possibility of him going to higher level of care.  Patient at this time also does not want to give up on self Medicating his as needed meds.  We will reevaluate next week.  His wife is in [place] and he wants to continue staying there  Pulmonary emphysema, unspecified emphysema type ([place]) Continues to have issues with SOB on walking long distance  On Albuterol per his Choice   Benign prostatic hyperplasia  On Vesicare and Flomax Stage 3b chronic kidney disease ([place]) Stable Anxiety On Librium Physical deconditioning Continues to get Worse Weight loss Continues to be the issue Family/ staff Communication:   Labs/tests ordered:     Total time spent in this patient care encounter was  _45  minutes; greater than 50% of the visit spent counseling patient and staff, reviewing records , Labs and coordinating care for problems addressed at this encounter.

## 2021-07-23 ENCOUNTER — Encounter (INDEPENDENT_AMBULATORY_CARE_PROVIDER_SITE_OTHER): Payer: Medicare Other | Admitting: Ophthalmology

## 2021-08-07 ENCOUNTER — Encounter: Payer: Self-pay | Admitting: Orthopedic Surgery

## 2021-08-07 ENCOUNTER — Non-Acute Institutional Stay: Payer: Medicare Other | Admitting: Orthopedic Surgery

## 2021-08-07 DIAGNOSIS — N1832 Chronic kidney disease, stage 3b: Secondary | ICD-10-CM

## 2021-08-07 DIAGNOSIS — J439 Emphysema, unspecified: Secondary | ICD-10-CM | POA: Diagnosis not present

## 2021-08-07 DIAGNOSIS — R634 Abnormal weight loss: Secondary | ICD-10-CM

## 2021-08-07 DIAGNOSIS — F419 Anxiety disorder, unspecified: Secondary | ICD-10-CM

## 2021-08-07 DIAGNOSIS — N400 Enlarged prostate without lower urinary tract symptoms: Secondary | ICD-10-CM | POA: Diagnosis not present

## 2021-08-07 DIAGNOSIS — N3000 Acute cystitis without hematuria: Secondary | ICD-10-CM

## 2021-08-07 DIAGNOSIS — C678 Malignant neoplasm of overlapping sites of bladder: Secondary | ICD-10-CM

## 2021-08-07 DIAGNOSIS — F1190 Opioid use, unspecified, uncomplicated: Secondary | ICD-10-CM

## 2021-08-07 NOTE — Progress Notes (Signed)
Location:  [place] Room Number: 36 Place of Service:  ALF [phone]) Provider:  Windell Moulding, AGNP-C  Virgie Dad, MD  Patient Care Team: Virgie Dad, MD as PCP - General (Internal Medicine) Ezekiel Slocumb, NP as Nurse Practitioner [facility] and Palliative Medicine)  Extended Emergency Contact Information Primary Emergency Contact: Mellissa Kohut of [place] Phone: [phone] Mobile Phone: [phone] Relation: Son Secondary Emergency Contact: Cranford,Ann Address: [address] [place]          [place], [place] [address] Johnnette Litter of [place] Phone: [phone] Relation: Spouse  Code Status:  Hospice Goals of care: Advanced Directive information Advanced Directives 07/05/2021  Does Patient Have a Medical Advance Directive? Yes  Type of Advance Directive [place]  Does patient want to make changes to medical advance directive? No - Patient declined  Copy of [place] in Chart? Yes - validated most recent copy scanned in chart (See row information)  Would patient like information on creating a medical advance directive? -  Pre-existing out of facility DNR order (yellow form or pink MOST form) -     Chief Complaint  Patient presents with   Acute Visit    HPI:  Pt is a 87 y.o. male seen today for acute visit due to dysuria and bladder pressure.   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 10/02 he was seen by hospice for new symptoms of dysuria and bladder pressure. UA positive for blood, protein and leukocytes. Urine culture revealed 50,000-100,000 CFU/ml of Enterococcus faecalis and 50,000-100,000 CFU/ml of klebsiella pneumoniae. Today, he reports mild dysuria, bladder pressure and foul smelling urine. Remains afebrile, no flank pain. Reports increasing fluid intake in the past few days.    Pulmonary emphysema- sob with distance walking, albuterol prn BPH- denies increased retention, remains on vesicare and Flomax CKD 3b- BUN/creat 19/1.51 03/2021 Anxiety-improved since pain control has improved, Librium Weight loss- progressive, reports eating 2 meals daily Narcotic use- remains on methadone and dilaudid tablets prn, LBM 10/04, reports pain level at 5= goal  No recent falls, injuries or behavioral outbursts.   Recent weights:  10/03- 97.8 lbs  09/05- 101.4 lbs  08/05- 105.6 lbs   Past Medical History:  Diagnosis Date   Anxiety    Asthma    as child   Bladder cancer [facility]) urologist-  dr Elliot Gault [facility] in [place], [place])   dx 01/ 2016--- post TURBT and post Bladder bx 02-10-2017   Bladder tumor    BPH (benign prostatic hyperplasia)    Frequency of urination    History of asthma    childhood   History of external beam radiation therapy    2002  prostate/ pelvis and boost w/ radioactive prostate seed implants    History of hypercalcemia    s/p  parathyroidectomy 2003   History of prostate cancer current urologist-  dr Elliot Gault [facility] in [place], [place])-- per lov note (care everywhere) last PSA undetectable   dx 2002--  urologist-- dr dahlstedt/ oncologist dr Valere Dross---  Gleason 7 out of 10, PSA 4.9---post Radioactive Prostate Seed Implant and External Beam Radiation therapy   History of skin cancer    Hyperlipidemia    Multiple lung nodules    since 2006--- followed by pcp --- dr kim   Pneumonia    Renal cyst, left  Wears glasses    Wears hearing aid in both ears    Past Surgical History:  Procedure Laterality Date   CARDIOVASCULAR STRESS TEST  01/01/2004   normal nuclear perfustion study w/ no ischemia/  normal LV function and wall motion, ef 65%   CATARACT EXTRACTION W/ INTRAOCULAR LENS  IMPLANT, BILATERAL  2010   COLONOSCOPY     CYSTO/  BILATERAL RETROGRADE PYELOGRAM/  BLADDER BX'S AND FULGERATION  02-10-2017   dr  Anabel Bene [facility] in [place], [place] W/ RETROGRADES Bilateral 07/27/2017   Procedure: CYSTOSCOPY,SELECTIVE CYTOLOGIES,RETROGRADE PYELOGRAM;  Surgeon: Cleon Gustin, MD;  [facility]: [facility];  Service: Urology;  Laterality: Bilateral;   CYSTOSCOPY W/ RETROGRADES Bilateral 12/08/2017   Procedure: CYSTOSCOPY WITH RETROGRADE PYELOGRAM;  Surgeon: Cleon Gustin, MD;  Location: [facility];  Service: Urology;  Laterality: Bilateral;   CYSTOSCOPY WITH BIOPSY N/A 07/27/2017   Procedure: CYSTOSCOPY WITH BIOPSY OF BLADDER AND PROSTATIC URETHRA;  Surgeon: Cleon Gustin, MD;  Location: [facility];  Service: Urology;  Laterality: N/A;   CYSTOSCOPY WITH BIOPSY Bilateral 12/08/2017   Procedure: CYSTOSCOPY WITH RENAL WASHINGS;  Surgeon: Cleon Gustin, MD;  Location: [facility];  Service: Urology;  Laterality: Bilateral;   CYSTOSCOPY WITH RETROGRADE PYELOGRAM, URETEROSCOPY AND STENT PLACEMENT Left 11/26/2020   Procedure: CYSTOSCOPY WITH LEFT RETROGRADE PYELOGRAM, URETEROSCOPY, BRUSH BIOPSY, TURBT;  Surgeon: Franchot Gallo, MD;  [facility];  Service: Urology;  Laterality: Left;   FIBEROPTIC BRONCHOSCOPY  08-20-2005   dr wert   w/ Left lower lobe bx   PARATHYROIDECTOMY  2003   RADIOACTIVE PROSTATE SEED IMPLANTS  04-05-2001    dr Diona Fanti [place]  child   TRANSURETHRAL RESECTION OF BLADDER TUMOR  11-24-2014   dr Anabel Bene [facility] in [place], [place] OF BLADDER TUMOR N/A 04/08/2018   Procedure: TRANSURETHRAL RESECTION OF BLADDER TUMOR (TURBT);  Surgeon: Cleon Gustin, MD;  Location: [facility];  Service: Urology;  Laterality: N/A;   TRANSURETHRAL RESECTION OF PROSTATE N/A 02/28/2019   Procedure: TRANSURETHRAL RESECTION OF THE PROSTATE (TURP);  Surgeon: Cleon Gustin, MD;  [facility];  Service: Urology;  Laterality: N/A;   30 MINS   UPPER GI ENDOSCOPY      Allergies  Allergen Reactions   Adhesive [Tape] Other (See Comments)    Tears skin off PAPER TAPE IS OKAY    Outpatient Encounter Medications as of 08/07/2021  Medication Sig   albuterol (VENTOLIN HFA) 108 (90 Base) MCG/ACT inhaler Inhale 2 puffs into the lungs every 6 (six) hours as needed.   chlordiazePOXIDE (LIBRIUM) 10 MG capsule Take 1 capsule (10 mg total) by mouth daily.   HYDROmorphone (DILAUDID) 2 MG tablet Take by mouth every 4 (four) hours as needed for severe pain.   HYDROmorphone (DILAUDID) 2 MG tablet Take 2 tablets (4 mg total) by mouth every 4 (four) hours as needed for severe pain.   methadone (DOLOPHINE) 5 MG tablet Take 5 mg by mouth 2 (two) times daily.   solifenacin (VESICARE) 10 MG tablet Take 10 mg by mouth daily. In the morning.   tamsulosin (FLOMAX) 0.4 MG CAPS capsule Take 0.8 mg by mouth daily.   No facility-administered encounter medications on file as of 08/07/2021.    Review of Systems  Constitutional:  Negative for activity change, appetite change, chills, fatigue and fever.  Respiratory:  Positive for shortness of breath. Negative  for cough and wheezing.   Cardiovascular:  Negative for chest pain and leg swelling.  Gastrointestinal:  Negative for abdominal distention, abdominal pain, constipation, diarrhea and nausea.  Genitourinary:  Positive for dysuria. Negative for difficulty urinating, frequency and hematuria.       Urine odor, bladder pressure  Musculoskeletal: Negative.   Skin: Negative.   Neurological:  Positive for weakness. Negative for dizziness and headaches.  Psychiatric/Behavioral:  Negative for dysphoric mood and sleep disturbance. The patient is nervous/anxious.    Immunization History  Administered Date(s) Administered   Influenza, High Dose Seasonal PF 08/04/2019   Influenza,inj,Quad PF,6+ Mos 08/05/2018   Influenza-Unspecified 07/24/2020   Moderna Sars-Covid-2 Vaccination 11/07/2019,  12/05/2019, 09/17/2020   Pneumococcal Conjugate-13 05/19/2017   Pneumococcal Polysaccharide-23 07/19/2012   Pertinent  Health Maintenance Due  Topic Date Due   INFLUENZA VACCINE  06/03/2021   Fall Risk  06/19/2021 11/14/2020 10/31/2020 06/05/2020 05/16/2020  Falls in the past year? 0 0 0 0 0  Number falls in past yr: 0 0 0 0 0  Injury with Fall? 0 - - - -  Risk for fall due to : History of fall(s);Other (Comment) - - - -  Risk for fall due to: Comment opioid use - - - -  Follow up Falls evaluation completed;Education provided;Falls prevention discussed - - - -   Functional Status Survey:    Vitals:   08/07/21 1310  BP: (!) 94/59  Pulse: 78  Resp: 16  Temp: (!) 97.5 F (36.4 C)  SpO2: 94%  Weight: 97 lb 12.8 oz (44.4 kg)   Body mass index is 16.03 kg/m. Physical Exam Vitals reviewed.  Constitutional:      General: He is not in acute distress. HENT:     Head: Normocephalic.  Eyes:     General:        Right eye: No discharge.        Left eye: No discharge.  Cardiovascular:     Rate and Rhythm: Normal rate and regular rhythm.     Pulses: Normal pulses.     Heart sounds: Normal heart sounds. No murmur heard. Pulmonary:     Effort: Pulmonary effort is normal. No respiratory distress.     Breath sounds: Normal breath sounds. No wheezing.  Abdominal:     General: Abdomen is flat. Bowel sounds are normal. There is no distension.     Palpations: Abdomen is soft.     Tenderness: There is no abdominal tenderness.  Genitourinary:    Comments: No tenderness when palpating bladder, urine orange with pungent odor Musculoskeletal:     Right lower leg: No edema.     Left lower leg: No edema.  Skin:    General: Skin is warm and dry.     Capillary Refill: Capillary refill takes less than 2 seconds.  Neurological:     General: No focal deficit present.     Mental Status: He is alert and oriented to person, place, and time.     Motor: Weakness present.     Gait: Gait abnormal.   Psychiatric:        Mood and Affect: Mood normal.        Behavior: Behavior normal.    Labs reviewed: Recent Labs    11/18/20 1429 03/21/21 1201  NA 141 139  K 4.2 4.1  CL 104 102  CO2 30 23  GLUCOSE 108* 93  BUN 24* 19  CREATININE 1.74* 1.51*  CALCIUM 8.8* 9.5   Recent Labs  11/18/20 1429 03/21/21 1201  AST 23 12  ALT 24 8*  ALKPHOS 71  --   BILITOT 0.7 0.5  PROT 6.9 6.6  ALBUMIN 4.3  --    Recent Labs    11/18/20 1429 03/21/21 1201  WBC 6.6 8.3  NEUTROABS 4.3 4,822  HGB 13.6 14.4  HCT 41.0 43.3  MCV 93.8 91.5  PLT 193 222   Lab Results  Component Value Date   TSH 1.57 03/21/2021   No results found for: HGBA1C Lab Results  Component Value Date   CHOL 163 03/21/2021   HDL 65 03/21/2021   LDLCALC 79 03/21/2021   TRIG 100 03/21/2021   CHOLHDL 2.5 03/21/2021    Significant Diagnostic Results in last 30 days:  No results found.  Assessment/Plan 1. Acute cystitis without hematuria - reports dysuria, bladder pressure and urine odor - remains afebrile, no flank pain - Urine culture revealed 50,000-100,000 CFU/ml of Enterococcus faecalis and 50,000-100,000 CFU/ml of klebsiella pneumoniae - start cipro 500 mg po bid x 7 days - encourage hydration with water  2. Pulmonary emphysema, unspecified emphysema type ([place]) - able to walk short distances, no oxygen use - cont albuterol prn  3. Benign prostatic hyperplasia without lower urinary tract symptoms - denies retention - cont vesicare and Flomax  4. Stage 3b chronic kidney disease ([place]) - cont to avoid nephrotoxic drugs like NSAIDS and does adjust medications to be renally excreted  5. Anxiety - no recent panic attacks - improved with pain control - cont Librium  6. Weight loss - progressive, averaging 3-4 lbs monthly  7. Narcotic drug use - due to bladder cancer - followed by hospice - pain rated 5= at goal today - LBM 10/04  8. Malignant neoplasm of overlapping sites of bladder  [facility]) - followed by hospice - wished to stay in [place]     Family/ staff Communication: plan discussed with patient and nurse  Labs/tests ordered:  none

## 2021-08-22 ENCOUNTER — Encounter: Payer: Self-pay | Admitting: Internal Medicine

## 2021-08-22 ENCOUNTER — Non-Acute Institutional Stay: Payer: Medicare Other | Admitting: Internal Medicine

## 2021-08-22 DIAGNOSIS — N400 Enlarged prostate without lower urinary tract symptoms: Secondary | ICD-10-CM

## 2021-08-22 DIAGNOSIS — J439 Emphysema, unspecified: Secondary | ICD-10-CM

## 2021-08-22 DIAGNOSIS — R5381 Other malaise: Secondary | ICD-10-CM | POA: Diagnosis not present

## 2021-08-22 DIAGNOSIS — C678 Malignant neoplasm of overlapping sites of bladder: Secondary | ICD-10-CM

## 2021-08-22 NOTE — Progress Notes (Signed)
Location:   [place] Room Number: 36 Place of Service:  ALF [phone]) Provider:  Veleta Miners MD   Virgie Dad, MD  Patient Care Team: Virgie Dad, MD as PCP - General (Internal Medicine) Ezekiel Slocumb, NP as Nurse Practitioner [facility] and Palliative Medicine)  Extended Emergency Contact Information Primary Emergency Contact: Mellissa Kohut of [place] Phone: [phone] Mobile Phone: [phone] Relation: Son Secondary Emergency Contact: Cranford,Ann Address: [address] [place]          [place], [place] [address] Johnnette Litter of [place] Phone: [phone] Relation: Spouse  Code Status:  Hospice Goals of care: Advanced Directive information Advanced Directives 08/22/2021  Does Patient Have a Medical Advance Directive? Yes  Type of Advance Directive [place]  Does patient want to make changes to medical advance directive? No - Patient declined  Copy of [place] in Chart? Yes - validated most recent copy scanned in chart (See row information)  Would patient like information on creating a medical advance directive? -  Pre-existing out of facility DNR order (yellow form or pink MOST form) -     Chief Complaint  Patient presents with   Medical Management of Chronic Issues   Quality Metric Gaps    Tdap, Shingrix, and flu shot    HPI:  Pt is a 87 y.o. male seen today for medical management of chronic diseases.   Patient has PMH  Covid Pneumonia BPH and h/o Bladder cancer, HLD and Depression and COPD   Was having Hematuria CT scan  showed Multifocal Urothelial Cancer Had Cystoscopy on 01/24. Since then he has been Diagnosed with Obstructive Urothelial Cancer of Left Urethra with Muscle invasive cancer of Bladder   Now Enrolled in Hospice after Urology said he is not Candidate for any Surgical Intervention  Pain Controlled with Methadone and PRN Hydromorphone.  Varies from 3-8/10 But continues to drive after being told many times not too Says that is only way to stay [place] is working with his family  Getting weak Poor appetite Has lost 5 Pounds since my last visit. Wanted to know how long would he stay in the [place] or has to move to SNF. Intermittent hematuria continues also Bladder spasms    Past Medical History:  Diagnosis Date   Anxiety    Asthma    as child   Bladder cancer [facility]) urologist-  dr Elliot Gault [facility] in [place], [place])   dx 01/ 2016--- post TURBT and post Bladder bx 02-10-2017   Bladder tumor    BPH (benign prostatic hyperplasia)    Frequency of urination    History of asthma    childhood   History of external beam radiation therapy    2002  prostate/ pelvis and boost w/ radioactive prostate seed implants    History of hypercalcemia    s/p  parathyroidectomy 2003   History of prostate cancer current urologist-  dr Elliot Gault [facility] in [place], [place])-- per lov note (care everywhere) last PSA undetectable   dx 2002--  urologist-- dr dahlstedt/ oncologist dr Valere Dross---  Gleason 7 out of 10, PSA 4.9---post Radioactive Prostate Seed Implant and External Beam Radiation therapy   History of skin cancer    Hyperlipidemia    Multiple lung nodules    since 2006--- followed by pcp --- dr kim   Pneumonia    Renal cyst, left    Wears glasses  Wears hearing aid in both ears    Past Surgical History:  Procedure Laterality Date   CARDIOVASCULAR STRESS TEST  01/01/2004   normal nuclear perfustion study w/ no ischemia/  normal LV function and wall motion, ef 65%   CATARACT EXTRACTION W/ INTRAOCULAR LENS  IMPLANT, BILATERAL  2010   COLONOSCOPY     CYSTO/  BILATERAL RETROGRADE PYELOGRAM/  BLADDER BX'S AND FULGERATION  02-10-2017   dr Anabel Bene [facility] in [place], [place] W/ RETROGRADES Bilateral 07/27/2017   Procedure: CYSTOSCOPY,SELECTIVE CYTOLOGIES,RETROGRADE PYELOGRAM;  Surgeon:  Cleon Gustin, MD;  Location: [facility];  Service: Urology;  Laterality: Bilateral;   CYSTOSCOPY W/ RETROGRADES Bilateral 12/08/2017   Procedure: CYSTOSCOPY WITH RETROGRADE PYELOGRAM;  Surgeon: Cleon Gustin, MD;  Location: [facility];  Service: Urology;  Laterality: Bilateral;   CYSTOSCOPY WITH BIOPSY N/A 07/27/2017   Procedure: CYSTOSCOPY WITH BIOPSY OF BLADDER AND PROSTATIC URETHRA;  Surgeon: Cleon Gustin, MD;  [facility];  Service: Urology;  Laterality: N/A;   CYSTOSCOPY WITH BIOPSY Bilateral 12/08/2017   Procedure: CYSTOSCOPY WITH RENAL WASHINGS;  Surgeon: Cleon Gustin, MD;  Location: [facility];  Service: Urology;  Laterality: Bilateral;   CYSTOSCOPY WITH RETROGRADE PYELOGRAM, URETEROSCOPY AND STENT PLACEMENT Left 11/26/2020   Procedure: CYSTOSCOPY WITH LEFT RETROGRADE PYELOGRAM, URETEROSCOPY, BRUSH BIOPSY, TURBT;  Surgeon: Franchot Gallo, MD;  [facility];  Service: Urology;  Laterality: Left;   FIBEROPTIC BRONCHOSCOPY  08-20-2005   dr wert   w/ Left lower lobe bx   PARATHYROIDECTOMY  2003   RADIOACTIVE PROSTATE SEED IMPLANTS  04-05-2001    dr Diona Fanti [place]  child   TRANSURETHRAL RESECTION OF BLADDER TUMOR  11-24-2014   dr Anabel Bene [facility] in [place], [place] OF BLADDER TUMOR N/A 04/08/2018   Procedure: TRANSURETHRAL RESECTION OF BLADDER TUMOR (TURBT);  Surgeon: Cleon Gustin, MD;  [facility];  Service: Urology;  Laterality: N/A;   TRANSURETHRAL RESECTION OF PROSTATE N/A 02/28/2019   Procedure: TRANSURETHRAL RESECTION OF THE PROSTATE (TURP);  Surgeon: Cleon Gustin, MD;  [facility];  Service: Urology;  Laterality: N/A;  30 MINS   UPPER GI ENDOSCOPY      Allergies  Allergen Reactions   Adhesive [Tape] Other (See Comments)    Tears skin off PAPER TAPE IS OKAY     Allergies as of 08/22/2021       Reactions   Adhesive [tape] Other (See Comments)   Tears skin off PAPER TAPE IS OKAY        Medication List        Accurate as of August 22, 2021 10:13 AM. If you have any questions, ask your nurse or doctor.          albuterol 108 (90 Base) MCG/ACT inhaler Commonly known as: VENTOLIN HFA Inhale 2 puffs into the lungs every 6 (six) hours as needed.   chlordiazePOXIDE 10 MG capsule Commonly known as: LIBRIUM Take 1 capsule (10 mg total) by mouth daily.   HYDROmorphone 2 MG tablet Commonly known as: DILAUDID Take 4 mg by mouth 2 (two) times daily as needed for severe pain. What changed: Another medication with the same name was removed. Continue taking this medication, and follow the directions you see here. Changed by: Virgie Dad, MD   methadone 5 MG tablet Commonly known as: DOLOPHINE Take 10 mg by mouth in the  morning, at noon, and at bedtime.   sennosides-docusate sodium 8.6-50 MG tablet Commonly known as: SENOKOT-S Take 2 tablets by mouth at bedtime.   solifenacin 10 MG tablet Commonly known as: VESICARE Take 10 mg by mouth daily. In the morning.   tamsulosin 0.4 MG Caps capsule Commonly known as: FLOMAX Take 0.8 mg by mouth daily.        Review of Systems  Constitutional:  Positive for activity change, appetite change and unexpected weight change.  HENT: Negative.    Respiratory: Negative.    Cardiovascular: Negative.   Gastrointestinal:  Positive for abdominal pain.  Genitourinary:  Positive for difficulty urinating, dysuria, flank pain, frequency, hematuria and urgency.  Musculoskeletal:  Positive for gait problem.  Skin: Negative.   Neurological:  Positive for weakness.  Psychiatric/Behavioral: Negative.     Immunization History  Administered Date(s) Administered   Influenza, High Dose Seasonal PF 08/04/2019   Influenza,inj,Quad PF,6+ Mos 08/05/2018   Influenza-Unspecified 07/24/2020   Moderna  Sars-Covid-2 Vaccination 11/07/2019, 12/05/2019, 09/17/2020   PFIZER(Purple Top)SARS-COV-2 Vaccination 08/05/2021   Pneumococcal Conjugate-13 05/19/2017   Pneumococcal Polysaccharide-23 07/19/2012   Pertinent  Health Maintenance Due  Topic Date Due   INFLUENZA VACCINE  06/03/2021   Fall Risk  06/19/2021 11/14/2020 10/31/2020 06/05/2020 05/16/2020  Falls in the past year? 0 0 0 0 0  Number falls in past yr: 0 0 0 0 0  Injury with Fall? 0 - - - -  Risk for fall due to : History of fall(s);Other (Comment) - - - -  Risk for fall due to: Comment opioid use - - - -  Follow up Falls evaluation completed;Education provided;Falls prevention discussed - - - -   Functional Status Survey:    Vitals:   08/22/21 1005  BP: 124/69  Pulse: 67  Resp: (!) 22  Temp: 97.8 F (36.6 C)  SpO2: 97%  Weight: 97 lb 12.8 oz (44.4 kg)  Height: 5' 5.5" (1.664 m)   Body mass index is 16.03 kg/m. Physical Exam Constitutional: Oriented to person, place, and time. Very frail HENT:  Head: Normocephalic.  Mouth/Throat: Oropharynx is clear and moist.  Eyes: Pupils are equal, round, and reactive to light.  Neck: Neck supple.  Cardiovascular: Normal rate and normal heart sounds.  No murmur heard. Pulmonary/Chest: Effort normal and breath sounds normal. No respiratory distress. No wheezes.  has no rales.  Abdominal: Soft. Bowel sounds are normal. No distension. There is no tenderness. There is no rebound.  Musculoskeletal: No edema.  Lymphadenopathy: none Neurological: Alert and oriented to person, place, and time.  Skin: Skin is warm and dry.  Psychiatric: Normal mood and affect. Behavior is normal. Thought content normal.   Labs reviewed: Recent Labs    11/18/20 1429 03/21/21 1201  NA 141 139  K 4.2 4.1  CL 104 102  CO2 30 23  GLUCOSE 108* 93  BUN 24* 19  CREATININE 1.74* 1.51*  CALCIUM 8.8* 9.5   Recent Labs    11/18/20 1429 03/21/21 1201  AST 23 12  ALT 24 8*  ALKPHOS 71  --   BILITOT  0.7 0.5  PROT 6.9 6.6  ALBUMIN 4.3  --    Recent Labs    11/18/20 1429 03/21/21 1201  WBC 6.6 8.3  NEUTROABS 4.3 4,822  HGB 13.6 14.4  HCT 41.0 43.3  MCV 93.8 91.5  PLT 193 222   Lab Results  Component Value Date   TSH 1.57 03/21/2021   No results found for: HGBA1C  Lab Results  Component Value Date   CHOL 163 03/21/2021   HDL 65 03/21/2021   LDLCALC 79 03/21/2021   TRIG 100 03/21/2021   CHOLHDL 2.5 03/21/2021    Significant Diagnostic Results in last 30 days:  No results found.  Assessment/Plan  Malignant neoplasm of overlapping sites of bladder ([place]) Continues with Weight loss Pain seems controlled Hospice managing Trying to convince him  to stop Driving Also will need higher level of care soon Pulmonary emphysema, unspecified emphysema type ([facility]) Continue Albuterol PRN Benign prostatic hyperplasia  On Vesicare and Flomax Physical deconditioning Continues to get worse Still able to do his ADLS H/o Anxiety On Librium  Family/ staff Communication:   Labs/tests ordered:

## 2021-08-26 ENCOUNTER — Encounter (INDEPENDENT_AMBULATORY_CARE_PROVIDER_SITE_OTHER): Payer: Medicare Other | Admitting: Ophthalmology

## 2021-08-27 ENCOUNTER — Encounter (INDEPENDENT_AMBULATORY_CARE_PROVIDER_SITE_OTHER): Admitting: Ophthalmology

## 2021-10-03 ENCOUNTER — Emergency Department (HOSPITAL_COMMUNITY): Payer: Medicare Other

## 2021-10-03 ENCOUNTER — Emergency Department (HOSPITAL_COMMUNITY)
Admission: EM | Admit: 2021-10-03 | Discharge: 2021-10-03 | Disposition: A | Discharge status: Home / Self Care | Payer: Medicare Other | Attending: Emergency Medicine | Admitting: Emergency Medicine

## 2021-10-03 ENCOUNTER — Encounter (HOSPITAL_COMMUNITY): Payer: Self-pay

## 2021-10-03 DIAGNOSIS — K649 Unspecified hemorrhoids: Secondary | ICD-10-CM | POA: Diagnosis not present

## 2021-10-03 DIAGNOSIS — Z8546 Personal history of malignant neoplasm of prostate: Secondary | ICD-10-CM | POA: Diagnosis not present

## 2021-10-03 DIAGNOSIS — Z87891 Personal history of nicotine dependence: Secondary | ICD-10-CM | POA: Diagnosis not present

## 2021-10-03 DIAGNOSIS — S51012A Laceration without foreign body of left elbow, initial encounter: Secondary | ICD-10-CM | POA: Diagnosis not present

## 2021-10-03 DIAGNOSIS — S0990XA Unspecified injury of head, initial encounter: Secondary | ICD-10-CM | POA: Diagnosis present

## 2021-10-03 DIAGNOSIS — S81011A Laceration without foreign body, right knee, initial encounter: Secondary | ICD-10-CM | POA: Diagnosis not present

## 2021-10-03 DIAGNOSIS — S81012A Laceration without foreign body, left knee, initial encounter: Secondary | ICD-10-CM | POA: Insufficient documentation

## 2021-10-03 DIAGNOSIS — N183 Chronic kidney disease, stage 3 unspecified: Secondary | ICD-10-CM | POA: Insufficient documentation

## 2021-10-03 DIAGNOSIS — S0181XA Laceration without foreign body of other part of head, initial encounter: Secondary | ICD-10-CM | POA: Diagnosis not present

## 2021-10-03 DIAGNOSIS — Z8551 Personal history of malignant neoplasm of bladder: Secondary | ICD-10-CM | POA: Insufficient documentation

## 2021-10-03 DIAGNOSIS — R350 Frequency of micturition: Secondary | ICD-10-CM | POA: Diagnosis not present

## 2021-10-03 DIAGNOSIS — Z8616 Personal history of COVID-19: Secondary | ICD-10-CM | POA: Insufficient documentation

## 2021-10-03 DIAGNOSIS — J449 Chronic obstructive pulmonary disease, unspecified: Secondary | ICD-10-CM | POA: Insufficient documentation

## 2021-10-03 DIAGNOSIS — R55 Syncope and collapse: Secondary | ICD-10-CM | POA: Diagnosis not present

## 2021-10-03 DIAGNOSIS — J45909 Unspecified asthma, uncomplicated: Secondary | ICD-10-CM | POA: Insufficient documentation

## 2021-10-03 DIAGNOSIS — W01198A Fall on same level from slipping, tripping and stumbling with subsequent striking against other object, initial encounter: Secondary | ICD-10-CM | POA: Diagnosis not present

## 2021-10-03 LAB — URINALYSIS, ROUTINE W REFLEX MICROSCOPIC
Bilirubin Urine: NEGATIVE
Glucose, UA: NEGATIVE mg/dL
Ketones, ur: 5 mg/dL — AB
Nitrite: NEGATIVE
Protein, ur: 100 mg/dL — AB
RBC / HPF: >50 RBC/hpf — ABNORMAL HIGH (ref 0–5)
Specific Gravity, Urine: 1.014 (ref 1.005–1.030)
WBC, UA: >50 WBC/hpf — ABNORMAL HIGH (ref 0–5)
pH: 5 (ref 5.0–8.0)

## 2021-10-03 LAB — TROPONIN I (HIGH SENSITIVITY)
Troponin I (High Sensitivity): 5 ng/L (ref <18)
Troponin I (High Sensitivity): 6 ng/L (ref <18)

## 2021-10-03 LAB — CBC WITH DIFFERENTIAL/PLATELET
Abs Immature Granulocytes: 0.01 10*3/uL (ref 0.00–0.07)
Basophils Absolute: 0 10*3/uL (ref 0.0–0.1)
Basophils Relative: 1 %
Eosinophils Absolute: 0.1 10*3/uL (ref 0.0–0.5)
Eosinophils Relative: 1 %
HCT: 29 % — ABNORMAL LOW (ref 39.0–52.0)
Hemoglobin: 9.1 g/dL — ABNORMAL LOW (ref 13.0–17.0)
Immature Granulocytes: 0 %
Lymphocytes Relative: 22 %
Lymphs Abs: 1.2 10*3/uL (ref 0.7–4.0)
MCH: 25 pg — ABNORMAL LOW (ref 26.0–34.0)
MCHC: 31.4 g/dL (ref 30.0–36.0)
MCV: 79.7 fL — ABNORMAL LOW (ref 80.0–100.0)
Monocytes Absolute: 0.6 10*3/uL (ref 0.1–1.0)
Monocytes Relative: 11 %
Neutro Abs: 3.7 10*3/uL (ref 1.7–7.7)
Neutrophils Relative %: 65 %
Platelets: 271 10*3/uL (ref 150–400)
RBC: 3.64 MIL/uL — ABNORMAL LOW (ref 4.22–5.81)
RDW: 15.2 % (ref 11.5–15.5)
WBC: 5.6 10*3/uL (ref 4.0–10.5)
nRBC: 0 % (ref 0.0–0.2)

## 2021-10-03 LAB — COMPREHENSIVE METABOLIC PANEL
ALT: 8 U/L (ref 0–44)
AST: 10 U/L — ABNORMAL LOW (ref 15–41)
Albumin: 2.6 g/dL — ABNORMAL LOW (ref 3.5–5.0)
Alkaline Phosphatase: 60 U/L (ref 38–126)
Anion gap: 4 — ABNORMAL LOW (ref 5–15)
BUN: 25 mg/dL — ABNORMAL HIGH (ref 8–23)
CO2: 27 mmol/L (ref 22–32)
Calcium: 7.8 mg/dL — ABNORMAL LOW (ref 8.9–10.3)
Chloride: 102 mmol/L (ref 98–111)
Creatinine, Ser: 1.58 mg/dL — ABNORMAL HIGH (ref 0.61–1.24)
GFR, Estimated: 42 mL/min — ABNORMAL LOW (ref >60)
Glucose, Bld: 115 mg/dL — ABNORMAL HIGH (ref 70–99)
Potassium: 4 mmol/L (ref 3.5–5.1)
Sodium: 133 mmol/L — ABNORMAL LOW (ref 135–145)
Total Bilirubin: 0.4 mg/dL (ref 0.3–1.2)
Total Protein: 5.3 g/dL — ABNORMAL LOW (ref 6.5–8.1)

## 2021-10-03 LAB — LACTIC ACID, PLASMA: Lactic Acid, Venous: 1 mmol/L (ref 0.5–1.9)

## 2021-10-03 MED ORDER — MORPHINE SULFATE (CONCENTRATE) 10 MG/0.5ML PO SOLN
20.0000 mg | Freq: Once | ORAL | Status: AC
  Administered 2021-10-03: 20 mg via ORAL
  Filled 2021-10-03: qty 1

## 2021-10-03 MED ORDER — CEPHALEXIN 500 MG PO CAPS: 500.0000 mg | ORAL_CAPSULE | Freq: Two times a day (BID) | ORAL | 0 refills | Status: DC

## 2021-10-03 MED ORDER — SODIUM CHLORIDE 0.9 % IV SOLN
2.0000 g | Freq: Once | INTRAVENOUS | Status: AC
  Administered 2021-10-03: 2 g via INTRAVENOUS
  Filled 2021-10-03: qty 20

## 2021-10-03 MED ORDER — SODIUM CHLORIDE 0.9 % IV BOLUS
1000.0000 mL | Freq: Once | INTRAVENOUS | Status: AC
  Administered 2021-10-03: 1000 mL via INTRAVENOUS

## 2021-10-03 NOTE — ED Provider Notes (Signed)
On reevaluation patient appears well.  Blood pressure is stable in the 575Y systolic.  He has received IV fluids.  His wounds were cleaned and dressed.  Spoke with the hospice nurse and patient was given a home dose of his morphine medication which he takes regularly to keep his pain managed.  Patient appears stable for discharge.   Blanchie Dessert, MD 10/03/21 1024

## 2021-10-03 NOTE — ED Notes (Signed)
Transport here to take pt.

## 2021-10-03 NOTE — ED Triage Notes (Signed)
Patient arrived via [facility] from [facility] after a syncopal episode in bathroom. Laceration on head, bleeding controlled. MOST form and DNR at bedside from facility.

## 2021-10-03 NOTE — Discharge Instructions (Signed)
The blood work today looks good and the images showed no sign of internal bleeding.  It will be important for you to change the bandages over the skin and up areas once a day.  Continue taking your regular pain medication.  The urine test today showed some concern for infection and you were given antibiotics here.  You were given a prescription for antibiotics which you can start taking waiting for culture results.  If you do develop fever, shortness of breath, vomiting return to the emergency room.

## 2021-10-03 NOTE — ED Notes (Signed)
Report given to Nani Skillern., RN at [facility].

## 2021-10-03 NOTE — ED Notes (Signed)
This nurse spoke with Standley Dakins, Geologist, engineering at [facility]. Per Standley Dakins, [place] to set-up transport for pt. ETA around 1130 this morning.   Venetia Constable [phone]

## 2021-10-03 NOTE — Progress Notes (Signed)
Manufacturing engineer [facility])  Mr. Jesse [name] is under our hospice care with a terminal diagnosis of obstructive urothelial cancer.   Patient treated for fall and laceration along with monitor of blood pressure and hemoglobin. He was transported back to [place] by [employer].  Thank you, Clementeen Hoof, RN, [facility] [phone]

## 2021-10-03 NOTE — ED Notes (Signed)
Pt attached to cardiac monitor x3. A&O x4. VSS. NAD.  °

## 2021-10-03 NOTE — ED Notes (Signed)
EDP at the bedside to re-evaluate.

## 2021-10-03 NOTE — ED Provider Notes (Signed)
[facility] DEPT Provider Note   CSN: [id number] Arrival date & time: 10/03/21  0156     History Chief Complaint  Patient presents with   Loss of Consciousness    Jesse [name] is a 87 y.o. male.  Patient presents to the emergency department for evaluation of near syncope/syncope.  Patient reports that he got up to go to the bathroom when the episode occurred.  He reports that he was walking to the bathroom, pushed the door open and went to the ground.  He thinks he briefly passed out, however, he reports remembering hitting the ground and everything that happened after.  Patient hit his forehead on the ground.  He is complaining of some headache as well as left elbow pain.  No chest pain or shortness of breath.  No dizziness.      Past Medical History:  Diagnosis Date   Anxiety    Asthma    as child   Bladder cancer [facility]) urologist-  dr Elliot Gault [facility] in [place], [place])   dx 01/ 2016--- post TURBT and post Bladder bx 02-10-2017   Bladder tumor    BPH (benign prostatic hyperplasia)    Frequency of urination    History of asthma    childhood   History of external beam radiation therapy    2002  prostate/ pelvis and boost w/ radioactive prostate seed implants    History of hypercalcemia    s/p  parathyroidectomy 2003   History of prostate cancer current urologist-  dr Elliot Gault [facility] in [place], [place])-- per lov note (care everywhere) last PSA undetectable   dx 2002--  urologist-- dr dahlstedt/ oncologist dr Valere Dross---  Gleason 7 out of 10, PSA 4.9---post Radioactive Prostate Seed Implant and External Beam Radiation therapy   History of skin cancer    Hyperlipidemia    Multiple lung nodules    since 2006--- followed by pcp --- dr kim   Pneumonia    Renal cyst, left    Wears glasses    Wears hearing aid in both ears     Patient Active Problem List   Diagnosis Date Noted   Exudative age-related macular  degeneration of left eye with active choroidal neovascularization ([place]) 01/10/2021   Malignant neoplasm of overlapping sites of bladder ([place]) 11/26/2020   Exudative age-related macular degeneration of right eye with inactive choroidal neovascularization ([place]) 07/12/2020   Right epiretinal membrane 07/12/2020   Advanced nonexudative age-related macular degeneration of right eye with subfoveal involvement 07/12/2020   Intermediate stage nonexudative age-related macular degeneration of left eye 07/12/2020   COPD (chronic obstructive pulmonary disease) with emphysema ([place]) 06/05/2020   Hyperlipidemia 06/05/2020   Pneumonia 05/02/2020   Pleural effusion 05/02/2020   Pericardial effusion 05/02/2020   Kidney disease, chronic, stage [name] (GFR 30-59 ml/min) ([place]) 05/02/2020   Chest pain 04/10/2020   Hypotension 11/28/2019   Anxiety 11/28/2019   Poor appetite 11/28/2019   Acute respiratory failure due to COVID-19 ([place]) 11/20/2019   BPH (benign prostatic hyperplasia) 11/20/2019   History of prostate cancer 11/20/2019   Acute respiratory failure ([place]) 11/20/2019    Past Surgical History:  Procedure Laterality Date   CARDIOVASCULAR STRESS TEST  01/01/2004   normal nuclear perfustion study w/ no ischemia/  normal LV function and wall motion, ef 65%   CATARACT EXTRACTION W/ INTRAOCULAR LENS  IMPLANT, BILATERAL  2010   COLONOSCOPY     CYSTO/  BILATERAL RETROGRADE PYELOGRAM/  BLADDER BX'S AND FULGERATION  02-10-2017   dr Anabel Bene [facility] in [place], [place] W/ RETROGRADES Bilateral 07/27/2017   Procedure: CYSTOSCOPY,SELECTIVE CYTOLOGIES,RETROGRADE PYELOGRAM;  Surgeon: Cleon Gustin, MD;  Location: [facility];  Service: Urology;  Laterality: Bilateral;   CYSTOSCOPY W/ RETROGRADES Bilateral 12/08/2017   Procedure: CYSTOSCOPY WITH RETROGRADE PYELOGRAM;  Surgeon: Cleon Gustin, MD;  Location: [facility];  Service: Urology;  Laterality: Bilateral;    CYSTOSCOPY WITH BIOPSY N/A 07/27/2017   Procedure: CYSTOSCOPY WITH BIOPSY OF BLADDER AND PROSTATIC URETHRA;  Surgeon: Cleon Gustin, MD;  Location: [facility];  Service: Urology;  Laterality: N/A;   CYSTOSCOPY WITH BIOPSY Bilateral 12/08/2017   Procedure: CYSTOSCOPY WITH RENAL WASHINGS;  Surgeon: Cleon Gustin, MD;  Location: [facility];  Service: Urology;  Laterality: Bilateral;   CYSTOSCOPY WITH RETROGRADE PYELOGRAM, URETEROSCOPY AND STENT PLACEMENT Left 11/26/2020   Procedure: CYSTOSCOPY WITH LEFT RETROGRADE PYELOGRAM, URETEROSCOPY, BRUSH BIOPSY, TURBT;  Surgeon: Franchot Gallo, MD;  Location: [facility];  Service: Urology;  Laterality: Left;   FIBEROPTIC BRONCHOSCOPY  08-20-2005   dr wert   w/ Left lower lobe bx   PARATHYROIDECTOMY  2003   RADIOACTIVE PROSTATE SEED IMPLANTS  04-05-2001    dr Diona Fanti [place]  child   TRANSURETHRAL RESECTION OF BLADDER TUMOR  11-24-2014   dr Anabel Bene [facility] in [place], [place] OF BLADDER TUMOR N/A 04/08/2018   Procedure: TRANSURETHRAL RESECTION OF BLADDER TUMOR (TURBT);  Surgeon: Cleon Gustin, MD;  Location: [facility];  Service: Urology;  Laterality: N/A;   TRANSURETHRAL RESECTION OF PROSTATE N/A 02/28/2019   Procedure: TRANSURETHRAL RESECTION OF THE PROSTATE (TURP);  Surgeon: Cleon Gustin, MD;  Location: [facility];  Service: Urology;  Laterality: N/A;  30 MINS   UPPER GI ENDOSCOPY         History reviewed. No pertinent family history.  Social History   Tobacco Use   Smoking status: Former    Years: 50.00    Types: Cigarettes    Quit date: 11/03/1984    Years since quitting: 36.9   Smokeless tobacco: Never  Vaping Use   Vaping Use: Never used  Substance Use Topics   Alcohol use: Yes    Alcohol/week: 7.0 standard drinks    Types: 7 Glasses of wine per week    Comment: daily wine   Drug use: No    Home  Medications Prior to Admission medications   Medication Sig Start Date End Date Taking? Authorizing Provider  albuterol (VENTOLIN HFA) 108 (90 Base) MCG/ACT inhaler Inhale 2 puffs into the lungs every 6 (six) hours as needed. 05/29/20   Laurin Coder, MD  chlordiazePOXIDE (LIBRIUM) 10 MG capsule Take 1 capsule (10 mg total) by mouth daily. 04/24/21   Mast, Man X, NP  HYDROmorphone (DILAUDID) 2 MG tablet Take 4 mg by mouth 2 (two) times daily as needed for severe pain.    [provider]  methadone (DOLOPHINE) 5 MG tablet Take 10 mg by mouth in the morning, at noon, and at bedtime.    [provider]  sennosides-docusate sodium (SENOKOT-S) 8.6-50 MG tablet Take 2 tablets by mouth at bedtime.    [provider]  solifenacin (VESICARE) 10 MG tablet Take 10 mg by mouth daily. In the morning.    [provider]  tamsulosin (FLOMAX) 0.4 MG CAPS capsule Take 0.8 mg by mouth daily.    [provider]    Allergies    Adhesive [tape]  Review of Systems   Review of Systems  Skin:  Positive for wound.  Neurological:  Positive for syncope and headaches.  All other systems reviewed and are negative.  Physical Exam Updated Vital Signs BP 118/63   Pulse 76   Temp (!) 97.5 F (36.4 C) (Oral)   Resp 13   Ht 5\' 5"  (1.651 m)   Wt 38.6 kg   SpO2 98%   BMI 14.14 kg/m   Physical Exam Vitals and nursing note reviewed.  Constitutional:      General: He is not in acute distress.    Appearance: Normal appearance. He is well-developed.  HENT:     Head: Normocephalic. Laceration (R forehead) present.     Right Ear: Hearing normal.     Left Ear: Hearing normal.     Nose: Nose normal.  Eyes:     Conjunctiva/sclera: Conjunctivae normal.     Pupils: Pupils are equal, round, and reactive to light.  Cardiovascular:     Rate and Rhythm: Regular rhythm.     Heart sounds: S1 normal and S2 normal. No murmur heard.   No friction rub. No gallop.  Pulmonary:      Effort: Pulmonary effort is normal. No respiratory distress.     Breath sounds: Normal breath sounds.  Chest:     Chest wall: No tenderness.  Abdominal:     General: Bowel sounds are normal.     Palpations: Abdomen is soft.     Tenderness: There is no abdominal tenderness. There is no guarding or rebound. Negative signs include Murphy's sign and McBurney's sign.     Hernia: No hernia is present.  Genitourinary:    Prostate: Normal.     Rectum: Guaiac result negative. External hemorrhoid present. No tenderness.  Musculoskeletal:        General: Normal range of motion.     Left elbow: No swelling or deformity. Normal range of motion. Tenderness present.     Cervical back: Normal range of motion and neck supple.  Skin:    General: Skin is warm and dry.     Comments: Superficial skin tear left elbow and bilateral knees  Neurological:     Mental Status: He is alert and oriented to person, place, and time.     GCS: GCS eye subscore is 4. GCS verbal subscore is 5. GCS motor subscore is 6.     Cranial Nerves: No cranial nerve deficit.     Sensory: No sensory deficit.     Coordination: Coordination normal.  Psychiatric:        Speech: Speech normal.        Behavior: Behavior normal.        Thought Content: Thought content normal.    ED Results / Procedures / Treatments   Labs (all labs ordered are listed, but only abnormal results are displayed) Labs Reviewed  CBC WITH DIFFERENTIAL/PLATELET - Abnormal; Notable for the following components:      Result Value   RBC 3.64 (*)    Hemoglobin 9.1 (*)    HCT 29.0 (*)    MCV 79.7 (*)    MCH 25.0 (*)    All other components within normal limits  COMPREHENSIVE METABOLIC PANEL - Abnormal; Notable for the following components:   Sodium 133 (*)    Glucose, Bld 115 (*)    BUN 25 (*)    Creatinine, Ser 1.58 (*)    Calcium 7.8 (*)  Total Protein 5.3 (*)    Albumin 2.6 (*)    AST 10 (*)    GFR, Estimated 42 (*)    Anion gap 4 (*)     All other components within normal limits  URINALYSIS, ROUTINE W REFLEX MICROSCOPIC - Abnormal; Notable for the following components:   APPearance CLOUDY (*)    Hgb urine dipstick LARGE (*)    Ketones, ur 5 (*)    Protein, ur 100 (*)    Leukocytes,Ua LARGE (*)    RBC / HPF >50 (*)    WBC, UA >50 (*)    Bacteria, UA RARE (*)    All other components within normal limits  URINE CULTURE  LACTIC ACID, PLASMA  POC OCCULT BLOOD, ED  TROPONIN I (HIGH SENSITIVITY)  TROPONIN I (HIGH SENSITIVITY)    EKG EKG Interpretation  Date/Time:  Thursday October 03 2021 04:02:25 EST Ventricular Rate:  80 PR Interval:  161 QRS Duration: 90 QT Interval:  374 QTC Calculation: 432 R Axis:   87 Text Interpretation: Sinus rhythm Right atrial enlargement Borderline right axis deviation Nonspecific T abnormalities, lateral leads Baseline wander in lead(s) V3 Confirmed by Orpah Greek [phone]) on 10/03/2021 4:58:05 AM  Radiology DG Elbow Complete Left  Result Date: 10/03/2021 CLINICAL DATA:  Status post fall. EXAM: LEFT ELBOW - COMPLETE 3+ VIEW COMPARISON:  None. FINDINGS: There is no evidence of an acute fracture, dislocation, or joint effusion. A small chronic appearing deformity is seen involving the lateral aspect of the left radial head. Mild degenerative changes are also noted. Soft tissues are unremarkable. IMPRESSION: Chronic and degenerative changes, as described above, without acute osseous abnormality. Electronically Signed   By: Virgina Norfolk M.D.   On: 10/03/2021 04:00   CT HEAD WO CONTRAST (5MM)  Result Date: 10/03/2021 CLINICAL DATA:  Syncopal episode in the bathroom with scalp laceration. EXAM: CT HEAD WITHOUT CONTRAST TECHNIQUE: Contiguous axial images were obtained from the base of the skull through the vertex without intravenous contrast. COMPARISON:  None. FINDINGS: Brain: There is no evidence for acute hemorrhage, hydrocephalus, mass lesion, or abnormal extra-axial fluid  collection. No definite CT evidence for acute infarction. Diffuse loss of parenchymal volume is consistent with atrophy. Patchy low attenuation in the deep hemispheric and periventricular white matter is nonspecific, but likely reflects chronic microvascular ischemic demyelination. Vascular: No hyperdense vessel or unexpected calcification. Skull: No evidence for fracture. No worrisome lytic or sclerotic lesion. Sinuses/Orbits: The visualized paranasal sinuses and mastoid air cells are clear. Visualized portions of the globes and intraorbital fat are unremarkable. Other: None. IMPRESSION: 1. No acute intracranial abnormality. 2. Atrophy with chronic small vessel white matter ischemic disease. Electronically Signed   By: Misty Stanley M.D.   On: 10/03/2021 05:37    Procedures .Marland KitchenLaceration Repair  Date/Time: 10/03/2021 7:23 AM Performed by: Orpah Greek, MD Authorized by: Orpah Greek, MD   Consent:    Consent obtained:  Verbal   Consent given by:  Patient   Risks, benefits, and alternatives were discussed: yes     Risks discussed:  Infection and poor cosmetic result Universal protocol:    Procedure explained and questions answered to patient or proxy's satisfaction: yes     Relevant documents present and verified: yes     Test results available: yes     Imaging studies available: yes     Required blood products, implants, devices, and special equipment available: yes     Site/side marked: yes  Immediately prior to procedure, a time out was called: yes     Patient identity confirmed:  Verbally with patient Anesthesia:    Anesthesia method:  None Laceration details:    Location:  Face   Face location:  Forehead   Length (cm):  1.5 Pre-procedure details:    Preparation:  Patient was prepped and draped in usual sterile fashion and imaging obtained to evaluate for foreign bodies Exploration:    Hemostasis achieved with:  Direct pressure   Contaminated: no    Treatment:    Area cleansed with:  Chlorhexidine   Irrigation solution:  Sterile saline   Irrigation method:  Syringe Skin repair:    Repair method:  Tissue adhesive Approximation:    Approximation:  Close Repair type:    Repair type:  Simple Post-procedure details:    Dressing:  Open (no dressing)   Procedure completion:  Tolerated well, no immediate complications   Medications Ordered in ED Medications  sodium chloride 0.9 % bolus 1,000 mL (has no administration in time range)  cefTRIAXone (ROCEPHIN) 2 g in sodium chloride 0.9 % 100 mL IVPB (has no administration in time range)    ED Course  I have reviewed the triage vital signs and the nursing notes.  Pertinent labs & imaging results that were available during my care of the patient were reviewed by me and considered in my medical decision making (see chart for details).    MDM Rules/Calculators/A&P                           Patient presents to the emergency department for evaluation of syncope.  Patient was going to the bathroom when he fell and hit the floor.  He reports that he blacked out, however it does not sound like he was completely unconscious.  He remembers hitting the floor.  Patient with small laceration to the right side of his forehead.  He does not have any neck pain.  Patient complaining of left elbow pain secondary to a skin tear, x-ray negative.  Patient with superficial abrasion/skin tears to both knees, normal range of motion with out tenderness of the knees.  No deformity.  Patient's work-up reassuring.  Hemoglobin is lower than prior but on the order of other hemoglobins that have been seen in the past.  Hemoccult was negative.  Urinalysis has returned and does suggest infection.  He does, however, have a chronic indwelling Foley catheter and a history of bladder cancer.  Will require culture to determine if this is truly a pathogen.  We add will empiric coverage.  Patient's blood pressures are  dropping a little.  He does not appear septic at this time.  Vital signs are otherwise normal.  He is orthostatic.  This might be the cause of his brief syncopal episode after standing up.  We will give IV fluids and recheck.  Signout oncoming ER physician to follow-up on results.  Final Clinical Impression(s) / ED Diagnoses Final diagnoses:  Syncope and collapse  Facial laceration, initial encounter    Rx / DC Orders ED Discharge Orders     None        Amsi Grimley, Gwenyth Allegra, MD 10/03/21 0725

## 2021-10-03 NOTE — ED Notes (Signed)
Abrasions to bilateral knees cleaned and dressed. Skin tear to left elbow previously dermabonded by nightshift ED. Left elbow skin tear dressed by this nurse. EDP, Dr. Maryan Rued aware.

## 2021-10-06 LAB — URINE CULTURE: Culture: >=100000 — AB

## 2021-10-07 ENCOUNTER — Telehealth: Payer: Self-pay | Admitting: Emergency Medicine

## 2021-10-07 NOTE — Progress Notes (Signed)
ED Antimicrobial Stewardship Positive Culture Follow Up   Jesse [name] is an 87 y.o. male who presented to [facility] on 10/03/2021 with a chief complaint of  Chief Complaint  Patient presents with   Loss of Consciousness    Recent Results (from the past 720 hour(s))  Urine Culture     Status: Abnormal   Collection Time: 10/03/21  6:33 AM   Specimen: Urine, Catheterized  Result Value Ref Range Status   Specimen Description   Final    URINE, CATHETERIZED Performed at [facility] [address] [place], [place] [address]    Special Requests   Final    NONE Performed at [facility], [place] [address], [place], [place] [address]    Culture >=100,000 COLONIES/mL ENTEROCOCCUS FAECALIS (A)  Final   Report Status 10/06/2021 FINAL  Final   Organism ID, Bacteria ENTEROCOCCUS FAECALIS (A)  Final      Susceptibility   Enterococcus faecalis - MIC*    AMPICILLIN <=2 SENSITIVE Sensitive     NITROFURANTOIN <=16 SENSITIVE Sensitive     VANCOMYCIN 1 SENSITIVE Sensitive     * >=100,000 COLONIES/mL ENTEROCOCCUS FAECALIS    [x]  Treated with cephalexin, organism resistant to prescribed antimicrobial []  Patient discharged originally without antimicrobial agent and treatment is now indicated  New antibiotic prescription: Stop cephalexin. Symptom check. If having urinary symptoms, amoxicillin 250 mg BID X 7 days. If no symptoms, no treatment.   ED Provider: Dorie Rank, MD   Jimmy Footman, PharmD, BCPS, [facility] Clinical Pharmacist Phone: [phone] 10/07/2021, 9:55 AM

## 2021-10-07 NOTE — Telephone Encounter (Signed)
Post ED Visit - Positive Culture Follow-up: Successful Patient Follow-Up  Culture assessed and recommendations reviewed by:  []  Elenor Quinones, Pharm.D. []  Heide Guile, Pharm.D., BCPS AQ-ID []  Parks Neptune, Pharm.D., BCPS []  Alycia Rossetti, Pharm.D., BCPS []  Rhe[place], [place].D., BCPS, AAHIVP []  Legrand Como, Pharm.D., BCPS, AAHIVP []  Salome Arnt, PharmD, BCPS []  Johnnette Gourd, PharmD, BCPS []  Hughes Better, PharmD, BCPS []  Leeroy Cha, PharmD Jimmy Footman PharmD  Positive urine culture  []  Patient discharged without antimicrobial prescription and treatment is now indicated []  Organism is resistant to prescribed ED discharge antimicrobial []  Patient with positive blood cultures  Changes discussed with ED provider: Dorie Rank MD New antibiotic prescription stop cephalexin, symptom check, if symptoms, start AMOXICILLIN  250mg  po bid x 7 days [facility] at [facility] states that think is cancer pain and not infection therefore no new treatment      Hazle Nordmann 10/07/2021, 12:07 PM

## 2021-11-22 ENCOUNTER — Non-Acute Institutional Stay (SKILLED_NURSING_FACILITY): Payer: Medicare Other | Admitting: Orthopedic Surgery

## 2021-11-22 ENCOUNTER — Encounter: Payer: Self-pay | Admitting: Orthopedic Surgery

## 2021-11-22 DIAGNOSIS — N400 Enlarged prostate without lower urinary tract symptoms: Secondary | ICD-10-CM

## 2021-11-22 DIAGNOSIS — C678 Malignant neoplasm of overlapping sites of bladder: Secondary | ICD-10-CM

## 2021-11-22 DIAGNOSIS — J439 Emphysema, unspecified: Secondary | ICD-10-CM

## 2021-11-22 DIAGNOSIS — K5903 Drug induced constipation: Secondary | ICD-10-CM

## 2021-11-22 DIAGNOSIS — R627 Adult failure to thrive: Secondary | ICD-10-CM

## 2021-11-22 DIAGNOSIS — F419 Anxiety disorder, unspecified: Secondary | ICD-10-CM

## 2021-11-22 NOTE — Progress Notes (Signed)
Location:  [place] Room Number: [place] of Service:  [phone]) Provider:  Windell Moulding, AGNP-C  Virgie Dad, MD  Patient Care Team: Virgie Dad, MD as PCP - General (Internal Medicine) Ezekiel Slocumb, NP as Nurse Practitioner [facility] and Palliative Medicine)  Extended Emergency Contact Information Primary Emergency Contact: Mellissa Kohut of [place] Phone: [phone] Mobile Phone: [phone] Relation: Son Secondary Emergency Contact: Cranford,Ann Address: [address] [place]          [place], [place] [address] Johnnette Litter of [place] Phone: [phone] Relation: Spouse  Code Status:  DNR Goals of care: Advanced Directive information Advanced Directives 10/03/2021  Does Patient Have a Medical Advance Directive? Yes  Type of Advance Directive Out of facility DNR (pink MOST or yellow form)  Does patient want to make changes to medical advance directive? -  Copy of [place] in Chart? -  Would patient like information on creating a medical advance directive? -  Pre-existing out of facility DNR order (yellow form or pink MOST form) -     Chief Complaint  Patient presents with   Medical Management of Chronic Issues    HPI:  Pt is a 88 y.o. male seen today for medical management of chronic diseases.    He currently resides on the assisted living unit at [facility] PMH: hypotension, COPD, covid pneumonia, BPH, bladder cancer, prostate cancer, and anxiety.   Adult failure to thrive- d/t urothelial/bladder cancer, followed by hospice, not driving anymore, does not leave room, sleeping more- per nursing, continues to have poor appetite, current weight 87.6 lbs hopes to stay with wife in [place] for as long as possible Malignant neoplasm of bladder- see above, pain rated 5/10 today, pain managed by hospice, remains on roxanol Pulmonary emphysema- denies sob, uses albuterol prn BPH-  denies urinary issues, still able to walk to bathroom, remains on vesicare and Flomax Anxiety- reports having increased anxiety in the past few weeks, he states "I get anxious because I know I won't be here soon", " I am happy with my life, no regrets," remains on scheduled ativan Opioid induced constipation- LBM 01/19- per patient, remains on senna and miralax  Fall 10/2021. No apparent injury. Ambulates with walker, known to "furniture grab in room."   Hx ED visit due to syncope. Urine culture > 100,000 CFU/ML, given amoxicillin bid x 7 days. Symptoms resolved at this time.   Recent blood pressures:  01/18- 103/60  01/11- 118/61  01/04- 111/62  Recent weights:  01/05- 87.6 lbs  11/05- 89.4 lbs  10/03- 97.8 lbs  09/05- 101.4 lbs     Past Medical History:  Diagnosis Date   Anxiety    Asthma    as child   Bladder cancer [facility]) urologist-  dr Elliot Gault [facility] in [place], [place])   dx 01/ 2016--- post TURBT and post Bladder bx 02-10-2017   Bladder tumor    BPH (benign prostatic hyperplasia)    Frequency of urination    History of asthma    childhood   History of external beam radiation therapy    2002  prostate/ pelvis and boost w/ radioactive prostate seed implants    History of hypercalcemia    s/p  parathyroidectomy 2003   History of prostate cancer current urologist-  dr Elliot Gault [facility] in [place], [place])-- per lov note (care everywhere) last PSA undetectable   dx 2002--  urologist--  dr dahlstedt/ oncologist dr Valere Dross---  Gleason 7 out of 10, PSA 4.9---post Radioactive Prostate Seed Implant and External Beam Radiation therapy   History of skin cancer    Hyperlipidemia    Multiple lung nodules    since 2006--- followed by pcp --- dr Maudie Mercury   Pneumonia    Renal cyst, left    Wears glasses    Wears hearing aid in both ears    Past Surgical History:  Procedure Laterality Date   CARDIOVASCULAR STRESS TEST  01/01/2004   normal nuclear perfustion  study w/ no ischemia/  normal LV function and wall motion, ef 65%   CATARACT EXTRACTION W/ INTRAOCULAR LENS  IMPLANT, BILATERAL  2010   COLONOSCOPY     CYSTO/  BILATERAL RETROGRADE PYELOGRAM/  BLADDER BX'S AND FULGERATION  02-10-2017   dr Anabel Bene [facility] in [place], [place] W/ RETROGRADES Bilateral 07/27/2017   Procedure: CYSTOSCOPY,SELECTIVE CYTOLOGIES,RETROGRADE PYELOGRAM;  Surgeon: Cleon Gustin, MD;  Location: [facility];  Service: Urology;  Laterality: Bilateral;   CYSTOSCOPY W/ RETROGRADES Bilateral 12/08/2017   Procedure: CYSTOSCOPY WITH RETROGRADE PYELOGRAM;  Surgeon: Cleon Gustin, MD;  Location: [facility];  Service: Urology;  Laterality: Bilateral;   CYSTOSCOPY WITH BIOPSY N/A 07/27/2017   Procedure: CYSTOSCOPY WITH BIOPSY OF BLADDER AND PROSTATIC URETHRA;  Surgeon: Cleon Gustin, MD;  Location: [facility];  Service: Urology;  Laterality: N/A;   CYSTOSCOPY WITH BIOPSY Bilateral 12/08/2017   Procedure: CYSTOSCOPY WITH RENAL WASHINGS;  Surgeon: Cleon Gustin, MD;  Location: [facility];  Service: Urology;  Laterality: Bilateral;   CYSTOSCOPY WITH RETROGRADE PYELOGRAM, URETEROSCOPY AND STENT PLACEMENT Left 11/26/2020   Procedure: CYSTOSCOPY WITH LEFT RETROGRADE PYELOGRAM, URETEROSCOPY, BRUSH BIOPSY, TURBT;  Surgeon: Franchot Gallo, MD;  Location: [facility];  Service: Urology;  Laterality: Left;   FIBEROPTIC BRONCHOSCOPY  08-20-2005   dr wert   w/ Left lower lobe bx   PARATHYROIDECTOMY  2003   RADIOACTIVE PROSTATE SEED IMPLANTS  04-05-2001    dr Diona Fanti [place]  child   TRANSURETHRAL RESECTION OF BLADDER TUMOR  11-24-2014   dr Anabel Bene [facility] in [place], [place] OF BLADDER TUMOR N/A 04/08/2018   Procedure: TRANSURETHRAL RESECTION OF BLADDER TUMOR (TURBT);  Surgeon: Cleon Gustin, MD;  Location: [facility];   Service: Urology;  Laterality: N/A;   TRANSURETHRAL RESECTION OF PROSTATE N/A 02/28/2019   Procedure: TRANSURETHRAL RESECTION OF THE PROSTATE (TURP);  Surgeon: Cleon Gustin, MD;  Location: [facility];  Service: Urology;  Laterality: N/A;  30 MINS   UPPER GI ENDOSCOPY      Allergies  Allergen Reactions   Adhesive [Tape] Other (See Comments)    Tears skin off PAPER TAPE IS OKAY    Outpatient Encounter Medications as of 11/22/2021  Medication Sig   albuterol (VENTOLIN HFA) 108 (90 Base) MCG/ACT inhaler Inhale 2 puffs into the lungs every 6 (six) hours as needed.   cephALEXin (KEFLEX) 500 MG capsule Take 1 capsule (500 mg total) by mouth 2 (two) times daily.   chlordiazePOXIDE (LIBRIUM) 10 MG capsule Take 1 capsule (10 mg total) by mouth daily.   HYDROmorphone (DILAUDID) 2 MG tablet Take 4 mg by mouth 2 (two) times daily as needed for severe pain.   methadone (DOLOPHINE) 5 MG tablet Take 10 mg by mouth in the morning, at noon, and at bedtime.   sennosides-docusate sodium (SENOKOT-S) 8.6-50 MG  tablet Take 2 tablets by mouth at bedtime.   solifenacin (VESICARE) 10 MG tablet Take 10 mg by mouth daily. In the morning.   tamsulosin (FLOMAX) 0.4 MG CAPS capsule Take 0.8 mg by mouth daily.   No facility-administered encounter medications on file as of 11/22/2021.    Review of Systems  Constitutional:  Positive for activity change and appetite change. Negative for chills, diaphoresis, fatigue and fever.  HENT:  Negative for dental problem and trouble swallowing.   Eyes:  Negative for visual disturbance.  Respiratory:  Negative for cough, shortness of breath and wheezing.   Cardiovascular:  Negative for chest pain and leg swelling.  Gastrointestinal:  Positive for constipation. Negative for abdominal distention, abdominal pain, diarrhea and nausea.  Genitourinary:  Positive for hematuria. Negative for dysuria and frequency.  Musculoskeletal:  Positive for gait problem.  Skin:  Negative for  wound.  Neurological:  Positive for weakness. Negative for dizziness and headaches.  Psychiatric/Behavioral:  Negative for confusion, dysphoric mood and sleep disturbance. The patient is nervous/anxious.    Immunization History  Administered Date(s) Administered   Influenza, High Dose Seasonal PF 08/04/2019   Influenza,inj,Quad PF,6+ Mos 08/05/2018   Influenza-Unspecified 07/24/2020, 08/27/2021   Moderna Sars-Covid-2 Vaccination 11/07/2019, 12/05/2019, 09/17/2020   PFIZER(Purple Top)SARS-COV-2 Vaccination 08/05/2021   Pneumococcal Conjugate-13 05/19/2017   Pneumococcal Polysaccharide-23 07/19/2012   Pertinent  Health Maintenance Due  Topic Date Due   INFLUENZA VACCINE  Completed   Fall Risk 11/26/2020 11/27/2020 11/27/2020 11/28/2020 06/19/2021  Falls in the past year? - - - - 0  Was there an injury with Fall? - - - - 0  Fall Risk Category Calculator - - - - 0  Fall Risk Category - - - - Low  Patient Fall Risk Level High fall risk High fall risk High fall risk High fall risk Moderate fall risk  Patient at Risk for Falls Due to - - - - History of fall(s);Other (Comment)  Patient at Risk for Falls Due to - - - - opioid use  Fall risk Follow up - - - - Falls evaluation completed;Education provided;Falls prevention discussed   Functional Status Survey:    Vitals:   11/22/21 1455  BP: (!) 144/63  Pulse: 88  Resp: 14  Temp: 97.6 F (36.4 C)  SpO2: 93%  Weight: 87 lb 9.6 oz (39.7 kg)   Body mass index is 14.58 kg/m. Physical Exam Vitals reviewed.  Constitutional:      Appearance: He is cachectic. He is ill-appearing.  HENT:     Head: Normocephalic.     Right Ear: There is no impacted cerumen.     Left Ear: There is no impacted cerumen.     Nose: Nose normal.     Mouth/Throat:     Mouth: Mucous membranes are moist.  Eyes:     General:        Right eye: No discharge.        Left eye: No discharge.  Cardiovascular:     Rate and Rhythm: Normal rate and regular rhythm.      Pulses: Normal pulses.     Heart sounds: Normal heart sounds. No murmur heard. Pulmonary:     Effort: Pulmonary effort is normal. No respiratory distress.     Breath sounds: Normal breath sounds. No wheezing or rales.  Abdominal:     General: Bowel sounds are normal. There is no distension.     Palpations: Abdomen is soft.     Tenderness: There is no  abdominal tenderness.  Musculoskeletal:     Cervical back: Neck supple.     Right lower leg: No edema.     Left lower leg: No edema.  Skin:    General: Skin is warm and dry.     Capillary Refill: Capillary refill takes less than 2 seconds.  Neurological:     General: No focal deficit present.     Mental Status: He is alert and oriented to person, place, and time.     Motor: Weakness present.     Gait: Gait abnormal.     Comments: walker  Psychiatric:        Mood and Affect: Mood normal.        Behavior: Behavior normal.     Comments: Very pleasant today, alert x 4    Labs reviewed: Recent Labs    03/21/21 1201 10/03/21 0330  NA 139 133*  K 4.1 4.0  CL 102 102  CO2 23 27  GLUCOSE 93 115*  BUN 19 25*  CREATININE 1.51* 1.58*  CALCIUM 9.5 7.8*   Recent Labs    03/21/21 1201 10/03/21 0330  AST 12 10*  ALT 8* 8  ALKPHOS  --  60  BILITOT 0.5 0.4  PROT 6.6 5.3*  ALBUMIN  --  2.6*   Recent Labs    03/21/21 1201 10/03/21 0330  WBC 8.3 5.6  NEUTROABS 4,822 3.7  HGB 14.4 9.1*  HCT 43.3 29.0*  MCV 91.5 79.7*  PLT 222 271   Lab Results  Component Value Date   TSH 1.57 03/21/2021   No results found for: HGBA1C Lab Results  Component Value Date   CHOL 163 03/21/2021   HDL 65 03/21/2021   LDLCALC 79 03/21/2021   TRIG 100 03/21/2021   CHOLHDL 2.5 03/21/2021    Significant Diagnostic Results in last 30 days:  No results found.  Assessment/Plan 1. Adult failure to thrive - followed by hospice - frail, cachexia present - current weight 97.6 lbs - still able to do ADLs- needs more help  2. Malignant  neoplasm of overlapping sites of bladder ([place]) - see above - pain controlled by hospice - now on Roxanol tid and prn  3. Pulmonary emphysema, unspecified emphysema type ([place]) - cont albuterol prn  4. Benign prostatic hyperplasia without lower urinary tract symptoms - cont Vesicare and Flomax - recommend using urinal at night for falls prevention  5. Anxiety - no recent panic attacks - reports increased anxiety- related to end- of life - cont scheduled Ativan   6. Drug-induced constipation - LBM 01/09, abdomen soft - cont senna and miralax    Family/ staff Communication: plan discussed with patient and nurse  Labs/tests ordered:  none

## 2021-11-27 ENCOUNTER — Non-Acute Institutional Stay: Payer: Medicare Other | Admitting: Orthopedic Surgery

## 2021-11-27 ENCOUNTER — Encounter: Payer: Self-pay | Admitting: Orthopedic Surgery

## 2021-11-27 DIAGNOSIS — W19XXXA Unspecified fall, initial encounter: Secondary | ICD-10-CM | POA: Diagnosis not present

## 2021-11-27 DIAGNOSIS — S51012A Laceration without foreign body of left elbow, initial encounter: Secondary | ICD-10-CM | POA: Diagnosis not present

## 2021-11-27 DIAGNOSIS — S0101XA Laceration without foreign body of scalp, initial encounter: Secondary | ICD-10-CM | POA: Diagnosis not present

## 2021-11-27 DIAGNOSIS — R627 Adult failure to thrive: Secondary | ICD-10-CM

## 2021-11-27 NOTE — Progress Notes (Signed)
Location:   [place] Room Number: 36 Place of Service:  ALF [phone]) Provider:  Windell Moulding, NP  Virgie Dad, MD  Patient Care Team: Virgie Dad, MD as PCP - General (Internal Medicine) Ezekiel Slocumb, NP as Nurse Practitioner [facility] and Palliative Medicine)  Extended Emergency Contact Information Primary Emergency Contact: Mellissa Kohut of [place] Phone: [phone] Mobile Phone: [phone] Relation: Son Secondary Emergency Contact: Cranford,Ann Address: [address] [place]          [place], [place] [address] Johnnette Litter of [place] Phone: [phone] Relation: Spouse  Code Status:  DNR Goals of care: Advanced Directive information Advanced Directives 10/03/2021  Does Patient Have a Medical Advance Directive? Yes  Type of Advance Directive Out of facility DNR (pink MOST or yellow form)  Does patient want to make changes to medical advance directive? -  Copy of [place] in Chart? -  Would patient like information on creating a medical advance directive? -  Pre-existing out of facility DNR order (yellow form or pink MOST form) -     Chief Complaint  Patient presents with   Acute Visit    Fall with head injury    HPI:  Pt is a 88 y.o. male seen today for an acute visit for fall with head injury.   This morning he fell backwards while trying to get out of bed. He developed a deep laceration to the top of his head. He also developed a skin tear to his left elbow. Denies left arm pain at this time. He is currently enrolled with Hospice due to urothelial/bladder cancer. He continues to have poor appetite, weight loss and weakness. I discussed concerns about his head injury and recommended he go to the ED for further evaluation. He stated " I am not going anywhere." He denies headaches, nausea or vomiting. He has been furniture grabbing more while in his room. Advised him to start using  urinal more often and start using walker. VS: BP 91/69, temp 97.2, HR 60, RR 22, sats 98%.    Past Medical History:  Diagnosis Date   Anxiety    Asthma    as child   Bladder cancer [facility]) urologist-  dr Elliot Gault [facility] in [place], [place])   dx 01/ 2016--- post TURBT and post Bladder bx 02-10-2017   Bladder tumor    BPH (benign prostatic hyperplasia)    Frequency of urination    History of asthma    childhood   History of external beam radiation therapy    2002  prostate/ pelvis and boost w/ radioactive prostate seed implants    History of hypercalcemia    s/p  parathyroidectomy 2003   History of prostate cancer current urologist-  dr Elliot Gault [facility] in [place], [place])-- per lov note (care everywhere) last PSA undetectable   dx 2002--  urologist-- dr dahlstedt/ oncologist dr Valere Dross---  Gleason 7 out of 10, PSA 4.9---post Radioactive Prostate Seed Implant and External Beam Radiation therapy   History of skin cancer    Hyperlipidemia    Multiple lung nodules    since 2006--- followed by pcp --- dr kim   Pneumonia    Renal cyst, left    Wears glasses    Wears hearing aid in both ears    Past Surgical History:  Procedure Laterality Date   CARDIOVASCULAR STRESS TEST  01/01/2004   normal nuclear perfustion study w/ no  ischemia/  normal LV function and wall motion, ef 65%   CATARACT EXTRACTION W/ INTRAOCULAR LENS  IMPLANT, BILATERAL  2010   COLONOSCOPY     CYSTO/  BILATERAL RETROGRADE PYELOGRAM/  BLADDER BX'S AND FULGERATION  02-10-2017   dr Anabel Bene [facility] in [place], [place] W/ RETROGRADES Bilateral 07/27/2017   Procedure: CYSTOSCOPY,SELECTIVE CYTOLOGIES,RETROGRADE PYELOGRAM;  Surgeon: Cleon Gustin, MD;  Location: [facility];  Service: Urology;  Laterality: Bilateral;   CYSTOSCOPY W/ RETROGRADES Bilateral 12/08/2017   Procedure: CYSTOSCOPY WITH RETROGRADE PYELOGRAM;  Surgeon: Cleon Gustin, MD;  Location: [facility];  Service: Urology;  Laterality: Bilateral;   CYSTOSCOPY WITH BIOPSY N/A 07/27/2017   Procedure: CYSTOSCOPY WITH BIOPSY OF BLADDER AND PROSTATIC URETHRA;  Surgeon: Cleon Gustin, MD;  Location: [facility];  Service: Urology;  Laterality: N/A;   CYSTOSCOPY WITH BIOPSY Bilateral 12/08/2017   Procedure: CYSTOSCOPY WITH RENAL WASHINGS;  Surgeon: Cleon Gustin, MD;  Location: [facility];  Service: Urology;  Laterality: Bilateral;   CYSTOSCOPY WITH RETROGRADE PYELOGRAM, URETEROSCOPY AND STENT PLACEMENT Left 11/26/2020   Procedure: CYSTOSCOPY WITH LEFT RETROGRADE PYELOGRAM, URETEROSCOPY, BRUSH BIOPSY, TURBT;  Surgeon: Franchot Gallo, MD;  Location[facility];  Service: Urology;  Laterality: Left;   FIBEROPTIC BRONCHOSCOPY  08-20-2005   dr wert   w/ Left lower lobe bx   PARATHYROIDECTOMY  2003   RADIOACTIVE PROSTATE SEED IMPLANTS  04-05-2001    dr Diona Fanti [place]  child   TRANSURETHRAL RESECTION OF BLADDER TUMOR  11-24-2014   dr Anabel Bene [facility] in [place], [place] OF BLADDER TUMOR N/A 04/08/2018   Procedure: TRANSURETHRAL RESECTION OF BLADDER TUMOR (TURBT);  Surgeon: Cleon Gustin, MD;  Location: [facility];  Service: Urology;  Laterality: N/A;   TRANSURETHRAL RESECTION OF PROSTATE N/A 02/28/2019   Procedure: TRANSURETHRAL RESECTION OF THE PROSTATE (TURP);  Surgeon: Cleon Gustin, MD;  Location[facility];  Service: Urology;  Laterality: N/A;  30 MINS   UPPER GI ENDOSCOPY      Allergies  Allergen Reactions   Adhesive [Tape] Other (See Comments)    Tears skin off PAPER TAPE IS OKAY    Allergies as of 11/27/2021       Reactions   Adhesive [tape] Other (See Comments)   Tears skin off PAPER TAPE IS OKAY        Medication List        Accurate as of November 27, 2021  3:09 PM. If you have any questions, ask your nurse or doctor.           albuterol 108 (90 Base) MCG/ACT inhaler Commonly known as: VENTOLIN HFA Inhale 2 puffs into the lungs every 6 (six) hours as needed.   LORazepam 1 MG tablet Commonly known as: ATIVAN Take 1 mg by mouth in the morning and at bedtime.   MiraLax 17 GM/SCOOP powder Generic drug: polyethylene glycol powder Take 1 Container by mouth every other day.   morphine CONCENTRATE 10 mg / 0.5 ml concentrated solution Take 20 mg by mouth every 3 (three) hours as needed.   morphine CONCENTRATE 10 mg / 0.5 ml concentrated solution Take 10 mg by mouth 4 (four) times daily as needed.   NON FORMULARY benadryl/maaloxliquid; 80cc-80cc; amt: 5 mL; oral Thrush Three Times A Day - PRN Morning, Afternoon, Bedtime   sennosides-docusate sodium 8.6-50 MG tablet Commonly known as: SENOKOT-S Take 2  tablets by mouth at bedtime.   solifenacin 10 MG tablet Commonly known as: VESICARE Take 10 mg by mouth daily. In the morning.   tamsulosin 0.4 MG Caps capsule Commonly known as: FLOMAX Take 0.8 mg by mouth daily.        Review of Systems  Constitutional:  Negative for activity change, appetite change, chills, fatigue and fever.  HENT:  Negative for hearing loss and trouble swallowing.   Eyes:  Negative for visual disturbance.  Respiratory:  Negative for cough, shortness of breath and wheezing.   Cardiovascular:  Negative for chest pain and leg swelling.  Gastrointestinal:  Positive for constipation. Negative for nausea and vomiting.  Genitourinary:  Negative for dysuria and frequency.  Musculoskeletal:  Positive for gait problem. Negative for arthralgias and back pain.  Skin:  Positive for wound.  Neurological:  Positive for weakness. Negative for dizziness and headaches.  Psychiatric/Behavioral:  Negative for confusion and dysphoric mood. The patient is not nervous/anxious.    Immunization History  Administered Date(s) Administered   Influenza, High Dose Seasonal PF 08/04/2019    Influenza,inj,Quad PF,6+ Mos 08/05/2018   Influenza-Unspecified 07/24/2020, 08/27/2021   Moderna Sars-Covid-2 Vaccination 11/07/2019, 12/05/2019, 09/17/2020   PFIZER(Purple Top)SARS-COV-2 Vaccination 08/05/2021   Pneumococcal Conjugate-13 05/19/2017   Pneumococcal Polysaccharide-23 07/19/2012   Pertinent  Health Maintenance Due  Topic Date Due   INFLUENZA VACCINE  Completed   Fall Risk 11/26/2020 11/27/2020 11/27/2020 11/28/2020 06/19/2021  Falls in the past year? - - - - 0  Was there an injury with Fall? - - - - 0  Fall Risk Category Calculator - - - - 0  Fall Risk Category - - - - Low  Patient Fall Risk Level High fall risk High fall risk High fall risk High fall risk Moderate fall risk  Patient at Risk for Falls Due to - - - - History of fall(s);Other (Comment)  Patient at Risk for Falls Due to - - - - opioid use  Fall risk Follow up - - - - Falls evaluation completed;Education provided;Falls prevention discussed   Functional Status Survey:    Vitals:   11/27/21 1443  BP: 91/69  Pulse: 60  Resp: (!) 22  Temp: (!) 97.2 F (36.2 C)  SpO2: 91%  Weight: 87 lb 9.6 oz (39.7 kg)  Height: 5\' 5"  (1.651 m)   Body mass index is 14.58 kg/m. Physical Exam Vitals reviewed.  Constitutional:      Appearance: He is ill-appearing.     Comments: Frail  HENT:     Head:     Comments: 1cm laceration to parietal lobe, about 0.5cm deep, minimal bleeding, mild swelling around area, no foreign objects observed, non tender to touch Eyes:     General:        Right eye: No discharge.        Left eye: No discharge.  Cardiovascular:     Rate and Rhythm: Normal rate and regular rhythm.     Pulses: Normal pulses.     Heart sounds: Normal heart sounds.  Pulmonary:     Effort: Pulmonary effort is normal. No respiratory distress.     Breath sounds: Normal breath sounds. No wheezing.  Abdominal:     General: Bowel sounds are normal. There is no distension.     Palpations: Abdomen is soft.      Tenderness: There is no abdominal tenderness.  Musculoskeletal:     Left shoulder: No swelling or deformity. Normal range of motion. Normal strength.  Left elbow: No swelling or deformity. Normal range of motion. No tenderness.     Cervical back: Normal range of motion and neck supple.     Right lower leg: No edema.     Left lower leg: No edema.  Skin:    Findings: Lesion present.     Comments: 2cm skin tear to left elbow, no swelling, minimal bleeding, surrounding skin intact  Neurological:     General: No focal deficit present.     Mental Status: He is alert and oriented to person, place, and time.     Motor: Weakness present.     Gait: Gait abnormal.     Comments: walker  Psychiatric:        Mood and Affect: Mood normal.        Behavior: Behavior normal.    Labs reviewed: Recent Labs    03/21/21 1201 10/03/21 0330  NA 139 133*  K 4.1 4.0  CL 102 102  CO2 23 27  GLUCOSE 93 115*  BUN 19 25*  CREATININE 1.51* 1.58*  CALCIUM 9.5 7.8*   Recent Labs    03/21/21 1201 10/03/21 0330  AST 12 10*  ALT 8* 8  ALKPHOS  --  60  BILITOT 0.5 0.4  PROT 6.6 5.3*  ALBUMIN  --  2.6*   Recent Labs    03/21/21 1201 10/03/21 0330  WBC 8.3 5.6  NEUTROABS 4,822 3.7  HGB 14.4 9.1*  HCT 43.3 29.0*  MCV 91.5 79.7*  PLT 222 271   Lab Results  Component Value Date   TSH 1.57 03/21/2021   No results found for: HGBA1C Lab Results  Component Value Date   CHOL 163 03/21/2021   HDL 65 03/21/2021   LDLCALC 79 03/21/2021   TRIG 100 03/21/2021   CHOLHDL 2.5 03/21/2021    Significant Diagnostic Results in last 30 days:  No results found.  Assessment/Plan 1. Fall, initial encounter - fell while getting up from bed - not using walker often, furniture grabbing more - progressive weakness due to FTT - advised to use walker at all times - advised to use urinal more  2. Laceration of scalp without foreign body, initial encounter - concerns for poor wound healing without  suturing - refused ED today - advised to report to ED if bleeding reoccurs or wound does not show signs of healing in 24-48 hours - apply steri strips and pressure dressing- change prn  3. Skin tear of left elbow without complication, initial encounter - due to fall - cover with nonadherent dressing prn  4. Adult failure to thrive - poor appetite with weight loss - current weight 87 lbs - followed by Hospice    Family/ staff Communication: plan discussed with patient and nurse  Labs/tests ordered:   none

## 2021-12-02 ENCOUNTER — Encounter: Payer: Self-pay | Admitting: Orthopedic Surgery

## 2021-12-02 ENCOUNTER — Non-Acute Institutional Stay: Payer: Medicare Other | Admitting: Orthopedic Surgery

## 2021-12-02 DIAGNOSIS — S51012D Laceration without foreign body of left elbow, subsequent encounter: Secondary | ICD-10-CM | POA: Diagnosis not present

## 2021-12-02 DIAGNOSIS — R627 Adult failure to thrive: Secondary | ICD-10-CM

## 2021-12-02 DIAGNOSIS — S0101XD Laceration without foreign body of scalp, subsequent encounter: Secondary | ICD-10-CM | POA: Diagnosis not present

## 2021-12-02 NOTE — Progress Notes (Signed)
Location:   [place] Room Number: 36-A Place of Service:  ALF [phone]) Provider:  Windell Moulding, NP    Patient Care Team: Virgie Dad, MD as PCP - General (Internal Medicine) Ezekiel Slocumb, NP as Nurse Practitioner [facility] and Palliative Medicine)  Extended Emergency Contact Information Primary Emergency Contact: Mellissa Kohut of [place] Phone: [phone] Mobile Phone: [phone] Relation: Son Secondary Emergency Contact: Cranford,Ann Address: [address] [place]          [place], [place] [address] Johnnette Litter of [place] Phone: [phone] Relation: Spouse  Code Status:  DNR Goals of care: Advanced Directive information Advanced Directives 12/02/2021  Does Patient Have a Medical Advance Directive? Yes  Type of Paramedic of [place];Out of facility DNR (pink MOST or yellow form)  Does patient want to make changes to medical advance directive? No - Patient declined  Copy of [place] in Chart? Yes - validated most recent copy scanned in chart (See row information)  Would patient like information on creating a medical advance directive? -  Pre-existing out of facility DNR order (yellow form or pink MOST form) -     Chief Complaint  Patient presents with   Acute Visit    Head Laceration.    HPI:  Pt is a 88 y.o. male seen today for an acute visit for head laceration.   01/25 he fell backwards while standing up and hit the back of his head. He developed a deep laceration to the parietal lobe. He refused to go the ED for further evaluation. Steri strips were placed. Nursing advised to send him to ED if bleeding occurred or signs of non healing within 48 hours. Today, he denies headaches, nausea or vomiting. He denies pain to the back of his head. Steri strips have held up and wound remains closed. He is currently followed by hospice due to due to urothelial/bladder  cancer. He continues to have poor appetite. He is staying in bed more. He states " thank you for all the good care, I know it will be soon." Denies increased depression or anxiety.   LBM 01/27- per patient.    Past Medical History:  Diagnosis Date   Anxiety    Asthma    as child   Bladder cancer [facility]) urologist-  dr Elliot Gault [facility] in [place], [place])   dx 01/ 2016--- post TURBT and post Bladder bx 02-10-2017   Bladder tumor    BPH (benign prostatic hyperplasia)    Frequency of urination    History of asthma    childhood   History of external beam radiation therapy    2002  prostate/ pelvis and boost w/ radioactive prostate seed implants    History of hypercalcemia    s/p  parathyroidectomy 2003   History of prostate cancer current urologist-  dr Elliot Gault [facility] in [place], [place])-- per lov note (care everywhere) last PSA undetectable   dx 2002--  urologist-- dr dahlstedt/ oncologist dr Valere Dross---  Gleason 7 out of 10, PSA 4.9---post Radioactive Prostate Seed Implant and External Beam Radiation therapy   History of skin cancer    Hyperlipidemia    Multiple lung nodules    since 2006--- followed by pcp --- dr kim   Pneumonia    Renal cyst, left    Wears glasses    Wears hearing aid in both ears    Past Surgical History:  Procedure  Laterality Date   CARDIOVASCULAR STRESS TEST  01/01/2004   normal nuclear perfustion study w/ no ischemia/  normal LV function and wall motion, ef 65%   CATARACT EXTRACTION W/ INTRAOCULAR LENS  IMPLANT, BILATERAL  2010   COLONOSCOPY     CYSTO/  BILATERAL RETROGRADE PYELOGRAM/  BLADDER BX'S AND FULGERATION  02-10-2017   dr Anabel Bene [facility] in [place], [place] W/ RETROGRADES Bilateral 07/27/2017   Procedure: CYSTOSCOPY,SELECTIVE CYTOLOGIES,RETROGRADE PYELOGRAM;  Surgeon: Cleon Gustin, MD;  Location[facility];  Service: Urology;  Laterality: Bilateral;   CYSTOSCOPY W/ RETROGRADES Bilateral  12/08/2017   Procedure: CYSTOSCOPY WITH RETROGRADE PYELOGRAM;  Surgeon: Cleon Gustin, MD;  Location: [facility];  Service: Urology;  Laterality: Bilateral;   CYSTOSCOPY WITH BIOPSY N/A 07/27/2017   Procedure: CYSTOSCOPY WITH BIOPSY OF BLADDER AND PROSTATIC URETHRA;  Surgeon: Cleon Gustin, MD;  Location: [facility];  Service: Urology;  Laterality: N/A;   CYSTOSCOPY WITH BIOPSY Bilateral 12/08/2017   Procedure: CYSTOSCOPY WITH RENAL WASHINGS;  Surgeon: Cleon Gustin, MD;  Location[facility];  Service: Urology;  Laterality: Bilateral;   CYSTOSCOPY WITH RETROGRADE PYELOGRAM, URETEROSCOPY AND STENT PLACEMENT Left 11/26/2020   Procedure: CYSTOSCOPY WITH LEFT RETROGRADE PYELOGRAM, URETEROSCOPY, BRUSH BIOPSY, TURBT;  Surgeon: Franchot Gallo, MD;  [facility];  Service: Urology;  Laterality: Left;   FIBEROPTIC BRONCHOSCOPY  08-20-2005   dr wert   w/ Left lower lobe bx   PARATHYROIDECTOMY  2003   RADIOACTIVE PROSTATE SEED IMPLANTS  04-05-2001    dr Diona Fanti [place]  child   TRANSURETHRAL RESECTION OF BLADDER TUMOR  11-24-2014   dr Anabel Bene [facility] in [place], [place] OF BLADDER TUMOR N/A 04/08/2018   Procedure: TRANSURETHRAL RESECTION OF BLADDER TUMOR (TURBT);  Surgeon: Cleon Gustin, MD;  Location: [facility];  Service: Urology;  Laterality: N/A;   TRANSURETHRAL RESECTION OF PROSTATE N/A 02/28/2019   Procedure: TRANSURETHRAL RESECTION OF THE PROSTATE (TURP);  Surgeon: Cleon Gustin, MD;  [facility];  Service: Urology;  Laterality: N/A;  30 MINS   UPPER GI ENDOSCOPY      Allergies  Allergen Reactions   Adhesive [Tape] Other (See Comments)    Tears skin off PAPER TAPE IS OKAY    Allergies as of 12/02/2021       Reactions   Adhesive [tape] Other (See Comments)   Tears skin off PAPER TAPE IS OKAY        Medication List         Accurate as of December 02, 2021  1:27 PM. If you have any questions, ask your nurse or doctor.          albuterol 108 (90 Base) MCG/ACT inhaler Commonly known as: VENTOLIN HFA Inhale 2 puffs into the lungs every 6 (six) hours as needed.   LORazepam 1 MG tablet Commonly known as: ATIVAN Take 1 mg by mouth in the morning and at bedtime.   MiraLax 17 GM/SCOOP powder Generic drug: polyethylene glycol powder Take 1 Container by mouth every other day.   morphine CONCENTRATE 10 mg / 0.5 ml concentrated solution Take 20 mg by mouth every 3 (three) hours as needed.   morphine CONCENTRATE 10 mg / 0.5 ml concentrated solution Take 10 mg by mouth 4 (four) times daily as needed.   NON FORMULARY benadryl/maaloxliquid; 80cc-80cc; amt: 5 mL; oral Thrush Three Times A Day -  PRN Morning, Afternoon, Bedtime   sennosides-docusate sodium 8.6-50 MG tablet Commonly known as: SENOKOT-S Take 2 tablets by mouth at bedtime.   solifenacin 10 MG tablet Commonly known as: VESICARE Take 10 mg by mouth daily. In the morning.   tamsulosin 0.4 MG Caps capsule Commonly known as: FLOMAX Take 0.8 mg by mouth daily.        Review of Systems  Constitutional:  Positive for activity change, appetite change and fatigue. Negative for fever.  Cardiovascular:  Negative for chest pain and leg swelling.  Gastrointestinal:  Positive for constipation. Negative for abdominal pain, diarrhea, nausea and vomiting.  Musculoskeletal:  Positive for gait problem.  Skin:  Positive for wound.  Neurological:  Positive for weakness. Negative for dizziness and headaches.  Psychiatric/Behavioral:  Positive for dysphoric mood. Negative for confusion and sleep disturbance. The patient is nervous/anxious.    Immunization History  Administered Date(s) Administered   Influenza, High Dose Seasonal PF 08/04/2019   Influenza,inj,Quad PF,6+ Mos 08/05/2018   Influenza-Unspecified 07/24/2020, 08/27/2021   Moderna  Sars-Covid-2 Vaccination 11/07/2019, 12/05/2019, 09/17/2020   PFIZER(Purple Top)SARS-COV-2 Vaccination 08/05/2021   Pneumococcal Conjugate-13 05/19/2017   Pneumococcal Polysaccharide-23 07/19/2012   Pertinent  Health Maintenance Due  Topic Date Due   INFLUENZA VACCINE  Completed   Fall Risk 11/26/2020 11/27/2020 11/27/2020 11/28/2020 06/19/2021  Falls in the past year? - - - - 0  Was there an injury with Fall? - - - - 0  Fall Risk Category Calculator - - - - 0  Fall Risk Category - - - - Low  Patient Fall Risk Level High fall risk High fall risk High fall risk High fall risk Moderate fall risk  Patient at Risk for Falls Due to - - - - History of fall(s);Other (Comment)  Patient at Risk for Falls Due to - - - - opioid use  Fall risk Follow up - - - - Falls evaluation completed;Education provided;Falls prevention discussed   Functional Status Survey:    Vitals:   12/02/21 1321  BP: 91/60  Pulse: 69  Resp: 20  Temp: 97.7 F (36.5 C)  SpO2: 97%  Weight: 87 lb 9.6 oz (39.7 kg)  Height: 5\' 5"  (1.651 m)   Body mass index is 14.58 kg/m. Physical Exam Vitals reviewed.  Constitutional:      Appearance: He is ill-appearing.     Comments: Frail, cachexia  HENT:     Head:     Comments: 1cm laceration to parietal lobe, about 0.5cm deep, steri strips CDI, no drianage, mild swelling and erythema around area, no foreign objects observed, non tender to touch Eyes:     General:        Right eye: No discharge.        Left eye: No discharge.  Cardiovascular:     Rate and Rhythm: Normal rate and regular rhythm.     Pulses: Normal pulses.     Heart sounds: Normal heart sounds. No murmur heard. Pulmonary:     Effort: Pulmonary effort is normal. No respiratory distress.     Breath sounds: Normal breath sounds. No wheezing or rales.  Abdominal:     General: There is no distension.     Palpations: Abdomen is soft.     Tenderness: There is no abdominal tenderness.     Comments: Hypoactive  bowel sounds  Musculoskeletal:     Cervical back: Neck supple.     Right lower leg: No edema.     Left lower leg: No edema.  Skin:    General: Skin is warm and dry.     Capillary Refill: Capillary refill takes less than 2 seconds.     Comments: Skin tear to left elbow CDI, no drainage, surrounding skin intact  Neurological:     General: No focal deficit present.     Mental Status: He is alert and oriented to person, place, and time.     Motor: Weakness present.     Gait: Gait abnormal.     Comments: Bed bound  Psychiatric:        Mood and Affect: Mood normal.        Behavior: Behavior normal.    Labs reviewed: Recent Labs    03/21/21 1201 10/03/21 0330  NA 139 133*  K 4.1 4.0  CL 102 102  CO2 23 27  GLUCOSE 93 115*  BUN 19 25*  CREATININE 1.51* 1.58*  CALCIUM 9.5 7.8*   Recent Labs    03/21/21 1201 10/03/21 0330  AST 12 10*  ALT 8* 8  ALKPHOS  --  60  BILITOT 0.5 0.4  PROT 6.6 5.3*  ALBUMIN  --  2.6*   Recent Labs    03/21/21 1201 10/03/21 0330  WBC 8.3 5.6  NEUTROABS 4,822 3.7  HGB 14.4 9.1*  HCT 43.3 29.0*  MCV 91.5 79.7*  PLT 222 271   Lab Results  Component Value Date   TSH 1.57 03/21/2021   No results found for: HGBA1C Lab Results  Component Value Date   CHOL 163 03/21/2021   HDL 65 03/21/2021   LDLCALC 79 03/21/2021   TRIG 100 03/21/2021   CHOLHDL 2.5 03/21/2021    Significant Diagnostic Results in last 30 days:  No results found.  Assessment/Plan 1. Adult failure to thrive - followed by hospice due to urothelial/bladder cancer - eating less from 3 days ago - cachexia appears to be worse - staying in bed more, using urinal  2. Laceration of scalp without foreign body, subsequent encounter - steri strips keeping wound closed - no sign of infection/complication today  3. Skin tear of left elbow without complication, subsequent encounter - skin tear CDI, no sign of infection - cont non adherent dressing prn    Family/ staff  Communication: plan discussed with patient, wife and nurse  Labs/tests ordered: none

## 2022-01-01 DEATH — deceased

## 2022-04-06 IMAGING — DX DG ELBOW COMPLETE 3+V*L*
4 series · 4 of 4 positions shown · non-contrast
Comparison: None.

CLINICAL DATA: Status post fall.

EXAM:
LEFT ELBOW - COMPLETE 3+ VIEW

[abdomen kub (1 of 4)]
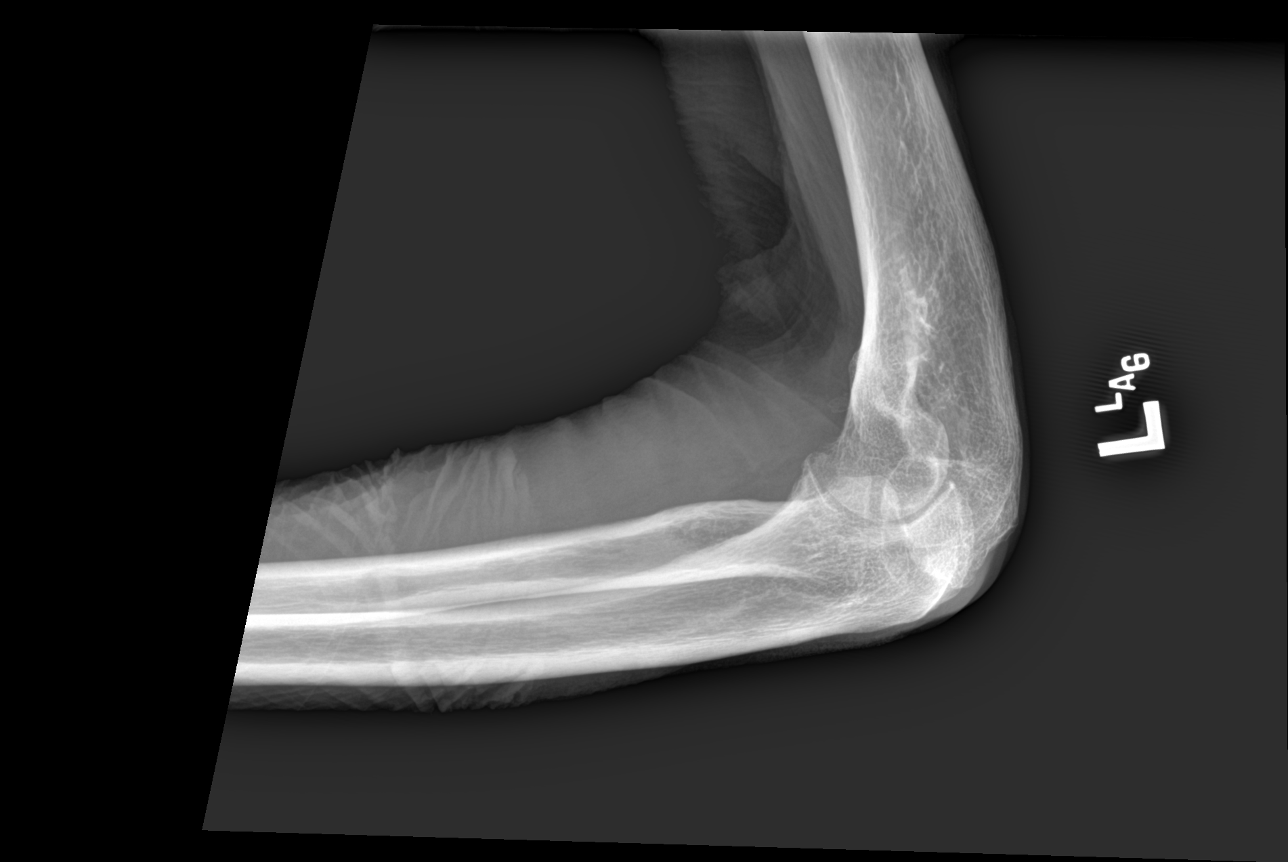

[abdomen kub (2 of 4)]
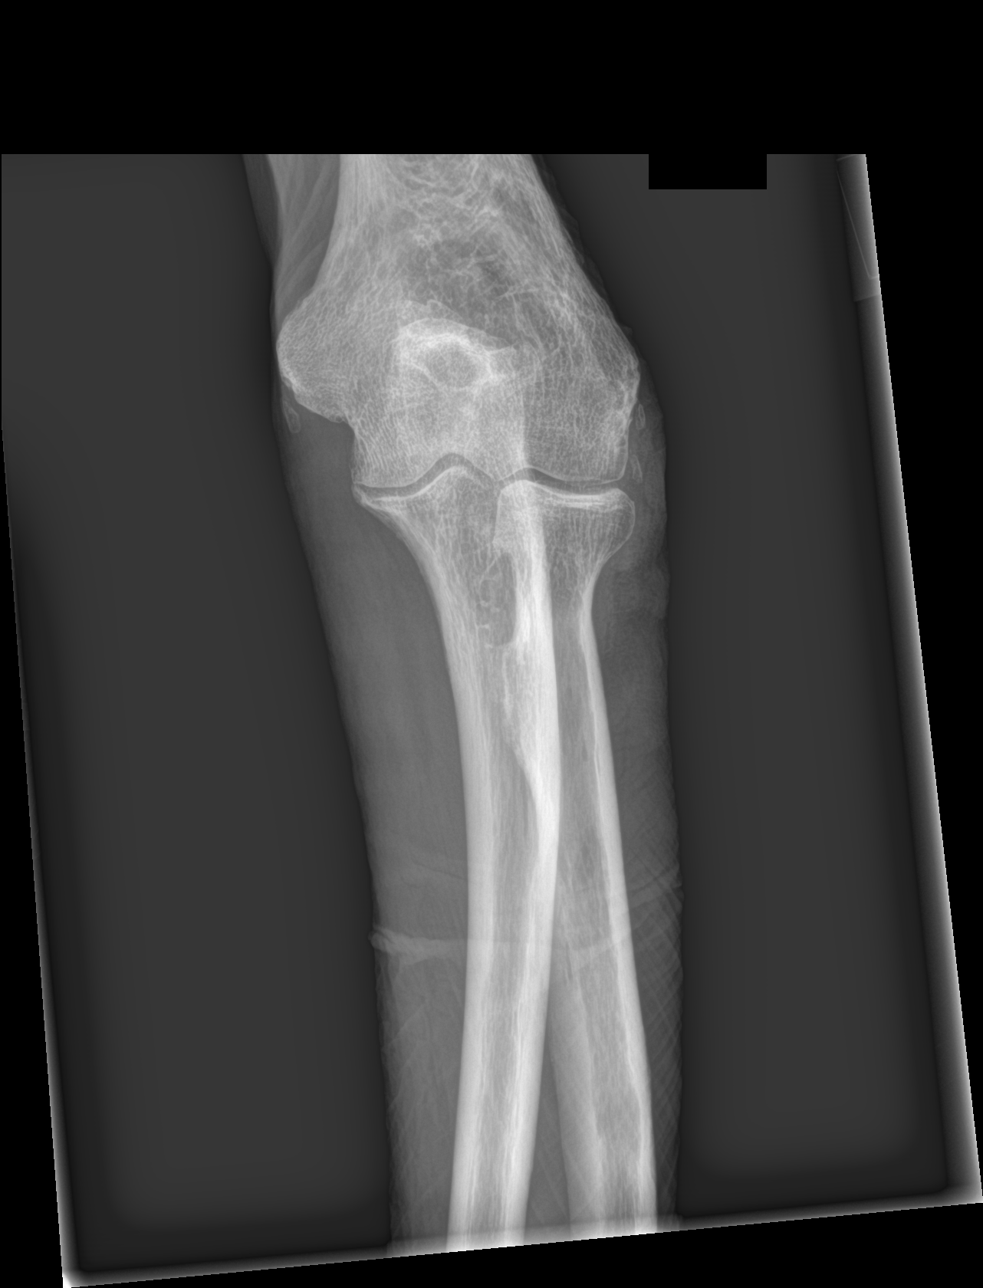

[abdomen kub (3 of 4)]
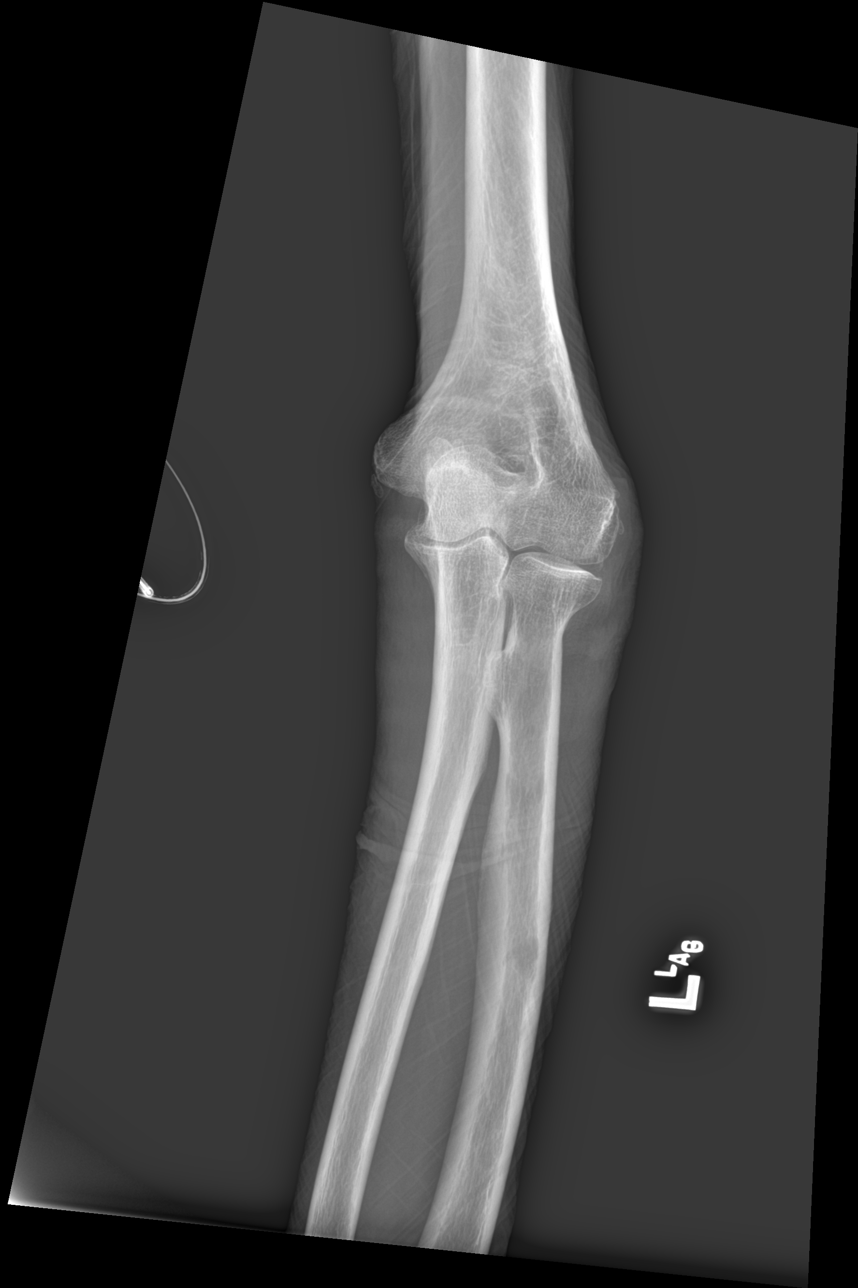

[abdomen kub (4 of 4)]
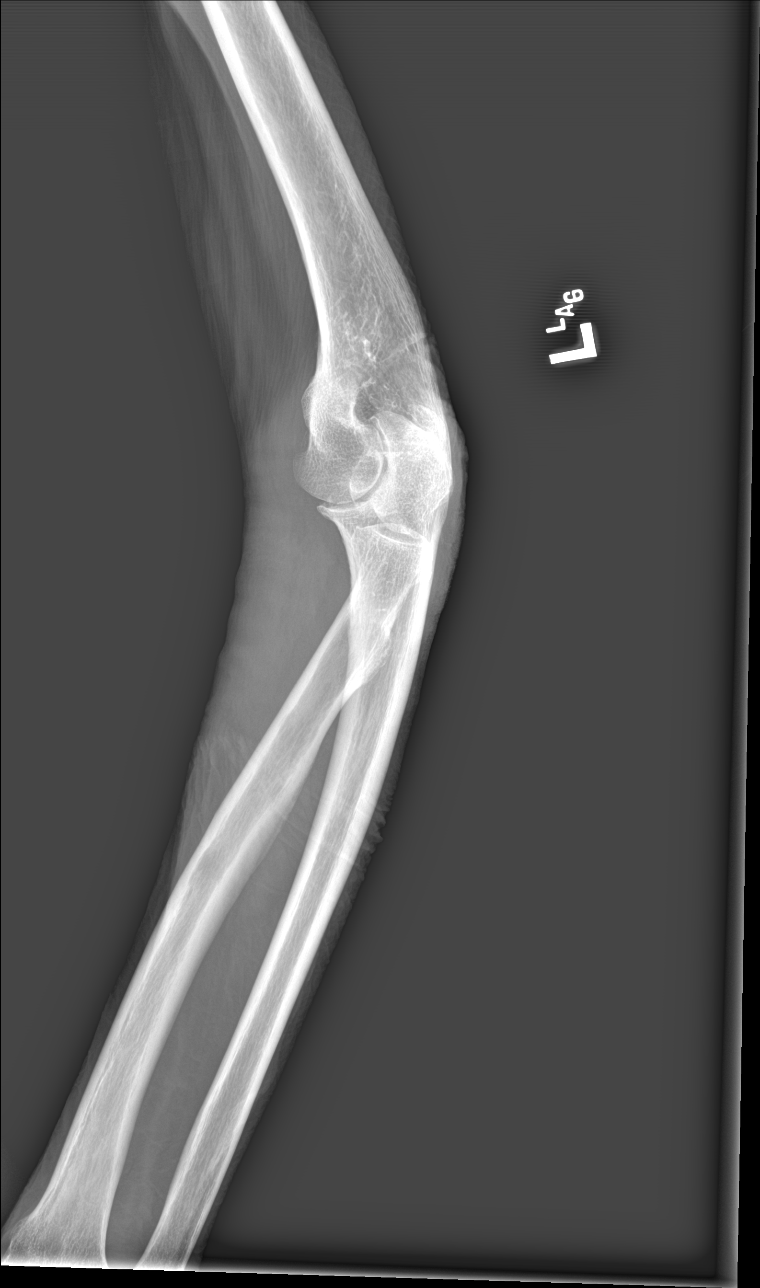

[4 of 4 positions shown; findings below may reference images not displayed]

FINDINGS: There is no evidence of an acute fracture, dislocation, or joint
effusion. A small chronic appearing deformity is seen involving the
lateral aspect of the left radial head. Mild degenerative changes
are also noted. Soft tissues are unremarkable.
IMPRESSION: Chronic and degenerative changes, as described above, without acute
osseous abnormality.
